# Patient Record
Sex: Female | Born: 1960 | Race: White | Hispanic: No | Smoking: Former smoker
Health system: Southern US, Community
[De-identification: ages and names within clinical notes are randomized; demographics above are authoritative.]

## PROBLEM LIST (undated history)

## (undated) DIAGNOSIS — K5790 Diverticulosis of intestine, part unspecified, without perforation or abscess without bleeding: Secondary | ICD-10-CM

## (undated) DIAGNOSIS — D649 Anemia, unspecified: Secondary | ICD-10-CM

## (undated) DIAGNOSIS — Z87440 Personal history of urinary (tract) infections: Secondary | ICD-10-CM

## (undated) DIAGNOSIS — R011 Cardiac murmur, unspecified: Secondary | ICD-10-CM

## (undated) DIAGNOSIS — D126 Benign neoplasm of colon, unspecified: Secondary | ICD-10-CM

## (undated) DIAGNOSIS — E785 Hyperlipidemia, unspecified: Secondary | ICD-10-CM

## (undated) DIAGNOSIS — T7840XA Allergy, unspecified, initial encounter: Secondary | ICD-10-CM

## (undated) DIAGNOSIS — K635 Polyp of colon: Secondary | ICD-10-CM

## (undated) DIAGNOSIS — M1990 Unspecified osteoarthritis, unspecified site: Secondary | ICD-10-CM

## (undated) DIAGNOSIS — I10 Essential (primary) hypertension: Secondary | ICD-10-CM

## (undated) DIAGNOSIS — N200 Calculus of kidney: Secondary | ICD-10-CM

## (undated) DIAGNOSIS — E039 Hypothyroidism, unspecified: Secondary | ICD-10-CM

## (undated) HISTORY — PX: HEMORRHOID SURGERY: SHX153

## (undated) HISTORY — DX: Polyp of colon: K63.5

## (undated) HISTORY — DX: Essential (primary) hypertension: I10

## (undated) HISTORY — DX: Personal history of urinary (tract) infections: Z87.440

## (undated) HISTORY — DX: Hyperlipidemia, unspecified: E78.5

## (undated) HISTORY — PX: BREAST CYST ASPIRATION: SHX578

## (undated) HISTORY — DX: Hypothyroidism, unspecified: E03.9

## (undated) HISTORY — PX: APPENDECTOMY: SHX54

## (undated) HISTORY — DX: Cardiac murmur, unspecified: R01.1

## (undated) HISTORY — PX: HERNIA REPAIR: SHX51

## (undated) HISTORY — DX: Unspecified osteoarthritis, unspecified site: M19.90

## (undated) HISTORY — DX: Diverticulosis of intestine, part unspecified, without perforation or abscess without bleeding: K57.90

## (undated) HISTORY — DX: Allergy, unspecified, initial encounter: T78.40XA

## (undated) HISTORY — PX: TONSILLECTOMY: SHX5217

## (undated) HISTORY — DX: Anemia, unspecified: D64.9

## (undated) HISTORY — DX: Benign neoplasm of colon, unspecified: D12.6

## (undated) HISTORY — DX: Calculus of kidney: N20.0

---

## 1998-09-14 ENCOUNTER — Ambulatory Visit (HOSPITAL_COMMUNITY): Admission: RE | Admit: 1998-09-14 | Discharge: 1998-09-14 | Payer: Self-pay | Admitting: *Deleted

## 1998-10-31 ENCOUNTER — Encounter: Payer: Self-pay | Admitting: Gastroenterology

## 1998-10-31 ENCOUNTER — Ambulatory Visit (HOSPITAL_COMMUNITY): Admission: RE | Admit: 1998-10-31 | Discharge: 1998-10-31 | Payer: Self-pay | Admitting: Gastroenterology

## 1999-01-16 ENCOUNTER — Other Ambulatory Visit: Admission: RE | Admit: 1999-01-16 | Discharge: 1999-01-16 | Payer: Self-pay | Admitting: Obstetrics and Gynecology

## 1999-07-13 ENCOUNTER — Encounter: Payer: Self-pay | Admitting: Obstetrics and Gynecology

## 1999-07-13 ENCOUNTER — Encounter: Admission: RE | Admit: 1999-07-13 | Discharge: 1999-07-13 | Payer: Self-pay | Admitting: Obstetrics and Gynecology

## 1999-08-29 ENCOUNTER — Encounter: Admission: RE | Admit: 1999-08-29 | Discharge: 1999-08-29 | Payer: Self-pay | Admitting: Family Medicine

## 1999-08-29 ENCOUNTER — Encounter: Payer: Self-pay | Admitting: Family Medicine

## 1999-11-29 ENCOUNTER — Encounter: Payer: Self-pay | Admitting: *Deleted

## 1999-11-29 ENCOUNTER — Encounter: Admission: RE | Admit: 1999-11-29 | Discharge: 1999-11-29 | Payer: Self-pay | Admitting: *Deleted

## 2000-01-15 ENCOUNTER — Other Ambulatory Visit: Admission: RE | Admit: 2000-01-15 | Discharge: 2000-01-15 | Payer: Self-pay | Admitting: Obstetrics and Gynecology

## 2001-01-27 ENCOUNTER — Other Ambulatory Visit: Admission: RE | Admit: 2001-01-27 | Discharge: 2001-01-27 | Payer: Self-pay | Admitting: Obstetrics and Gynecology

## 2001-06-09 ENCOUNTER — Encounter (INDEPENDENT_AMBULATORY_CARE_PROVIDER_SITE_OTHER): Payer: Self-pay

## 2001-06-09 ENCOUNTER — Ambulatory Visit (HOSPITAL_COMMUNITY): Admission: RE | Admit: 2001-06-09 | Discharge: 2001-06-09 | Payer: Self-pay | Admitting: General Surgery

## 2001-08-19 ENCOUNTER — Encounter: Admission: RE | Admit: 2001-08-19 | Discharge: 2001-08-19 | Payer: Self-pay | Admitting: Obstetrics and Gynecology

## 2001-08-19 ENCOUNTER — Encounter: Payer: Self-pay | Admitting: Obstetrics and Gynecology

## 2002-01-11 ENCOUNTER — Other Ambulatory Visit: Admission: RE | Admit: 2002-01-11 | Discharge: 2002-01-11 | Payer: Self-pay | Admitting: Urology

## 2002-02-02 ENCOUNTER — Other Ambulatory Visit: Admission: RE | Admit: 2002-02-02 | Discharge: 2002-02-02 | Payer: Self-pay | Admitting: Obstetrics and Gynecology

## 2002-09-23 ENCOUNTER — Encounter: Admission: RE | Admit: 2002-09-23 | Discharge: 2002-09-23 | Payer: Self-pay | Admitting: Obstetrics and Gynecology

## 2002-09-23 ENCOUNTER — Encounter: Payer: Self-pay | Admitting: Obstetrics and Gynecology

## 2003-03-07 ENCOUNTER — Other Ambulatory Visit: Admission: RE | Admit: 2003-03-07 | Discharge: 2003-03-07 | Payer: Self-pay | Admitting: Obstetrics and Gynecology

## 2003-10-28 ENCOUNTER — Encounter: Admission: RE | Admit: 2003-10-28 | Discharge: 2003-10-28 | Payer: Self-pay | Admitting: Obstetrics and Gynecology

## 2004-03-28 ENCOUNTER — Encounter: Admission: RE | Admit: 2004-03-28 | Discharge: 2004-03-28 | Payer: Self-pay | Admitting: Family Medicine

## 2004-11-12 ENCOUNTER — Encounter: Admission: RE | Admit: 2004-11-12 | Discharge: 2004-11-12 | Payer: Self-pay | Admitting: Obstetrics and Gynecology

## 2005-11-13 ENCOUNTER — Encounter: Admission: RE | Admit: 2005-11-13 | Discharge: 2005-11-13 | Payer: Self-pay | Admitting: Obstetrics and Gynecology

## 2006-12-02 ENCOUNTER — Encounter: Admission: RE | Admit: 2006-12-02 | Discharge: 2006-12-02 | Payer: Self-pay | Admitting: Obstetrics and Gynecology

## 2008-01-21 ENCOUNTER — Encounter: Admission: RE | Admit: 2008-01-21 | Discharge: 2008-01-21 | Payer: Self-pay | Admitting: Obstetrics and Gynecology

## 2008-08-25 ENCOUNTER — Encounter: Admission: RE | Admit: 2008-08-25 | Discharge: 2008-08-25 | Payer: Self-pay | Admitting: Obstetrics and Gynecology

## 2009-01-24 ENCOUNTER — Encounter: Admission: RE | Admit: 2009-01-24 | Discharge: 2009-01-24 | Payer: Self-pay | Admitting: Obstetrics and Gynecology

## 2009-03-08 ENCOUNTER — Ambulatory Visit: Payer: Self-pay | Admitting: Family Medicine

## 2009-03-08 DIAGNOSIS — M1990 Unspecified osteoarthritis, unspecified site: Secondary | ICD-10-CM | POA: Insufficient documentation

## 2009-03-08 DIAGNOSIS — I10 Essential (primary) hypertension: Secondary | ICD-10-CM | POA: Insufficient documentation

## 2009-03-08 DIAGNOSIS — Z87442 Personal history of urinary calculi: Secondary | ICD-10-CM | POA: Insufficient documentation

## 2009-03-08 DIAGNOSIS — Z87448 Personal history of other diseases of urinary system: Secondary | ICD-10-CM | POA: Insufficient documentation

## 2009-03-08 DIAGNOSIS — D509 Iron deficiency anemia, unspecified: Secondary | ICD-10-CM | POA: Insufficient documentation

## 2009-03-08 DIAGNOSIS — Z860100 Personal history of colon polyps, unspecified: Secondary | ICD-10-CM | POA: Insufficient documentation

## 2009-03-08 DIAGNOSIS — M7710 Lateral epicondylitis, unspecified elbow: Secondary | ICD-10-CM | POA: Insufficient documentation

## 2009-03-08 DIAGNOSIS — Z8679 Personal history of other diseases of the circulatory system: Secondary | ICD-10-CM | POA: Insufficient documentation

## 2009-03-08 DIAGNOSIS — J309 Allergic rhinitis, unspecified: Secondary | ICD-10-CM | POA: Insufficient documentation

## 2009-09-27 ENCOUNTER — Encounter: Admission: RE | Admit: 2009-09-27 | Discharge: 2009-09-27 | Payer: Self-pay | Admitting: Obstetrics and Gynecology

## 2009-10-10 ENCOUNTER — Encounter: Admission: RE | Admit: 2009-10-10 | Discharge: 2009-10-10 | Payer: Self-pay | Admitting: Obstetrics and Gynecology

## 2009-12-06 ENCOUNTER — Ambulatory Visit: Payer: Self-pay | Admitting: Family Medicine

## 2009-12-06 LAB — CONVERTED CEMR LAB
Blood in Urine, dipstick: NEGATIVE
Ketones, urine, test strip: NEGATIVE
Nitrite: NEGATIVE
Urobilinogen, UA: 0.2
pH: 5.5

## 2009-12-11 LAB — CONVERTED CEMR LAB
AST: 17 units/L (ref 0–37)
Alkaline Phosphatase: 43 units/L (ref 39–117)
BUN: 13 mg/dL (ref 6–23)
Basophils Relative: 0.6 % (ref 0.0–3.0)
Bilirubin, Direct: 0 mg/dL (ref 0.0–0.3)
Calcium: 9.2 mg/dL (ref 8.4–10.5)
Creatinine, Ser: 0.7 mg/dL (ref 0.4–1.2)
Eosinophils Absolute: 0.2 10*3/uL (ref 0.0–0.7)
Glucose, Bld: 86 mg/dL (ref 70–99)
HCT: 38.9 % (ref 36.0–46.0)
HDL: 63.1 mg/dL (ref >39.00)
Lymphocytes Relative: 27.9 % (ref 12.0–46.0)
MCV: 91.3 fL (ref 78.0–100.0)
Monocytes Absolute: 0.6 10*3/uL (ref 0.1–1.0)
Neutrophils Relative %: 62.2 % (ref 43.0–77.0)
Platelets: 229 10*3/uL (ref 150.0–400.0)

## 2009-12-13 ENCOUNTER — Ambulatory Visit: Payer: Self-pay | Admitting: Family Medicine

## 2010-01-26 ENCOUNTER — Encounter: Admission: RE | Admit: 2010-01-26 | Discharge: 2010-01-26 | Payer: Self-pay | Admitting: Obstetrics and Gynecology

## 2010-03-23 ENCOUNTER — Telehealth: Payer: Self-pay | Admitting: Family Medicine

## 2010-06-27 ENCOUNTER — Ambulatory Visit: Payer: Self-pay | Admitting: Family Medicine

## 2010-06-27 DIAGNOSIS — B351 Tinea unguium: Secondary | ICD-10-CM | POA: Insufficient documentation

## 2010-06-27 DIAGNOSIS — M461 Sacroiliitis, not elsewhere classified: Secondary | ICD-10-CM | POA: Insufficient documentation

## 2010-09-02 ENCOUNTER — Encounter: Payer: Self-pay | Admitting: Obstetrics and Gynecology

## 2010-09-11 ENCOUNTER — Other Ambulatory Visit: Payer: Self-pay | Admitting: Dermatology

## 2010-09-11 NOTE — Assessment & Plan Note (Signed)
Summary: back pain//ccm   Vital Signs:  Patient profile:   50 year old female Weight:      131 pounds Temp:     98.2 degrees F Pulse rate:   75 / minute BP sitting:   110 / 78  (left arm)  Vitals Entered By: Pura Spice, RN (June 27, 2010 2:14 PM) CC: back and hip pain "little better today "   History of Present Illness: here for 2 reasons. First she has had stiffness and pains in the lower back on and off for one month. it is worst when she first gets up in the morning and then eases up. Advil helps. No pain in the legs. No hx of trauma. Second, for one year she has had yellow discoloration and thickening of the toenails.   Allergies (verified): No Known Drug Allergies  Past History:  Past Medical History: Reviewed history from 03/08/2009 and no changes required. Chickenpox Allergic rhinitis Anemia-iron deficiency Colonic polyps, hx of Hypertension Nephrolithiasis, hx of Osteoarthritis Recurrent UTI Heart murmur sees Dr. Floyde Parkins for GYN exams  Past Surgical History: Reviewed history from 03/08/2009 and no changes required. Hemorrhoidectomy Tonsillectomy colonoscopy 1995  Review of Systems  The patient denies anorexia, fever, weight loss, weight gain, vision loss, decreased hearing, hoarseness, chest pain, syncope, dyspnea on exertion, peripheral edema, prolonged cough, headaches, hemoptysis, abdominal pain, melena, hematochezia, severe indigestion/heartburn, hematuria, incontinence, genital sores, muscle weakness, suspicious skin lesions, transient blindness, difficulty walking, depression, unusual weight change, abnormal bleeding, enlarged lymph nodes, angioedema, breast masses, and testicular masses.    Physical Exam  General:  Well-developed,well-nourished,in no acute distress; alert,appropriate and cooperative throughout examination Msk:  mildly tender in the lower back, especially over the SI joints. Full ROM Skin:  toenails are yellow, crusty,  and thick   Impression & Recommendations:  Problem # 1:  SACROILIITIS (ICD-720.2)  Problem # 2:  ONYCHOMYCOSIS (ICD-110.1)  Her updated medication list for this problem includes:    Lamisil 250 Mg Tabs (Terbinafine hcl) ..... Once daily  Complete Medication List: 1)  Hydrochlorothiazide 12.5 Mg Tabs (Hydrochlorothiazide) .... Take 1 tab  by mouth every morning 2)  Diclofenac Sodium 50 Mg Tbec (Diclofenac sodium) .... Three times a day as needed pain 3)  Lamisil 250 Mg Tabs (Terbinafine hcl) .... Once daily  Patient Instructions: 1)  Get regular exercise.  2)  Please schedule a follow-up appointment as needed .  Prescriptions: LAMISIL 250 MG TABS (TERBINAFINE HCL) once daily  #30 x 2   Entered and Authorized by:   Nelwyn Salisbury MD   Signed by:   Nelwyn Salisbury MD on 06/27/2010   Method used:   Electronically to        [facility]. [phone]* (retail)       [phone]       [place], [place]  [address]       Ph: [phone]       Fax: [phone]   RxID:   [id number] DICLOFENAC SODIUM 50 MG TBEC (DICLOFENAC SODIUM) three times a day as needed pain  #60 x 5   Entered and Authorized by:   Nelwyn Salisbury MD   Signed by:   Nelwyn Salisbury MD on 06/27/2010   Method used:   Electronically to        [facility]. [phone]* (retail)       [phone] [address]  [place], [place]  [address]       Ph: [phone]       Fax: [phone]   RxID:   [id number]    Orders Added: 1)  Est. Patient Level IV [84132]

## 2010-09-11 NOTE — Assessment & Plan Note (Signed)
Summary: cpx/no pap/njr   Vital Signs:  Patient profile:   50 year old female Weight:      126 pounds BMI:     23.70 BP sitting:   108 / 84  (left arm) Cuff size:   regular  Vitals Entered By: Raechel Ache, RN (Dec 13, 2009 1:33 PM) CC: CPX, labs done. Sees gyn.   History of Present Illness: 50 yr old female for a cpx. She feels fine in general, although she does mention having excessive tearing out of the right eye for severla months. The NP at work gave her some [employer] drops to try , but these have not helped. No buring or itching or redness. the other eye is fine. She does not wear contacts.   Allergies (verified): No Known Drug Allergies  Past History:  Past Medical History: Reviewed history from 03/08/2009 and no changes required. Chickenpox Allergic rhinitis Anemia-iron deficiency Colonic polyps, hx of Hypertension Nephrolithiasis, hx of Osteoarthritis Recurrent UTI Heart murmur sees Dr. Floyde Parkins for GYN exams  Past Surgical History: Reviewed history from 03/08/2009 and no changes required. Hemorrhoidectomy Tonsillectomy colonoscopy 1995  Family History: Reviewed history from 03/08/2009 and no changes required. Family History Breast cancer 1st degree relative <50 Family History of CAD Female 1st degree relative <50 Family History of Colon CA 1st degree relative <60 Family History Hypertension Family History Ovarian cancer Family History of Stroke M 1st degree relative <50  Social History: Reviewed history from 03/08/2009 and no changes required. Divorced Alcohol use-yes Drug use-no Regular exercise-yes Former smoker  Review of Systems  The patient denies anorexia, fever, weight loss, weight gain, vision loss, decreased hearing, hoarseness, chest pain, syncope, dyspnea on exertion, peripheral edema, prolonged cough, headaches, hemoptysis, abdominal pain, melena, hematochezia, severe indigestion/heartburn, hematuria, incontinence, genital  sores, muscle weakness, suspicious skin lesions, transient blindness, difficulty walking, depression, unusual weight change, abnormal bleeding, enlarged lymph nodes, angioedema, breast masses, and testicular masses.    Physical Exam  General:  Well-developed,well-nourished,in no acute distress; alert,appropriate and cooperative throughout examination Head:  Normocephalic and atraumatic without obvious abnormalities. No apparent alopecia or balding. Eyes:  No corneal or conjunctival inflammation noted. EOMI. Perrla. Funduscopic exam benign, without hemorrhages, exudates or papilledema. Vision grossly normal. Ears:  External ear exam shows no significant lesions or deformities.  Otoscopic examination reveals clear canals, tympanic membranes are intact bilaterally without bulging, retraction, inflammation or discharge. Hearing is grossly normal bilaterally. Nose:  External nasal examination shows no deformity or inflammation. Nasal mucosa are pink and moist without lesions or exudates. Mouth:  Oral mucosa and oropharynx without lesions or exudates.  Teeth in good repair. Neck:  No deformities, masses, or tenderness noted. Chest Wall:  No deformities, masses, or tenderness noted. Lungs:  Normal respiratory effort, chest expands symmetrically. Lungs are clear to auscultation, no crackles or wheezes. Heart:  Normal rate and regular rhythm. S1 and S2 normal without gallop, murmur, click, rub or other extra sounds. Abdomen:  Bowel sounds positive,abdomen soft and non-tender without masses, organomegaly or hernias noted. Msk:  No deformity or scoliosis noted of thoracic or lumbar spine.   Pulses:  R and L carotid,radial,femoral,dorsalis pedis and posterior tibial pulses are full and equal bilaterally Extremities:  No clubbing, cyanosis, edema, or deformity noted with normal full range of motion of all joints.   Neurologic:  No cranial nerve deficits noted. Station and gait are normal. Plantar reflexes are  down-going bilaterally. DTRs are symmetrical throughout. Sensory, motor and coordinative functions appear intact.  Skin:  Intact without suspicious lesions or rashes Cervical Nodes:  No lymphadenopathy noted Axillary Nodes:  No palpable lymphadenopathy Inguinal Nodes:  No significant adenopathy Psych:  Cognition and judgment appear intact. Alert and cooperative with normal attention span and concentration. No apparent delusions, illusions, hallucinations   Impression & Recommendations:  Problem # 1:  HEALTH MAINTENANCE EXAM (ICD-V70.0)  Complete Medication List: 1)  Hydrochlorothiazide 12.5 Mg Tabs (Hydrochlorothiazide) .... Take 1 tab  by mouth every morning  Patient Instructions: 1)  she seems to have a blocked tear duct in the right eye. She is to see her optometrist soon for a regular exam and she will ask them to look at this. She may need referral to an Ophthalmologist but we will see.    Preventive Care Screening  Mammogram:    Date:  09/12/2009    Results:  normal

## 2010-09-11 NOTE — Progress Notes (Signed)
Summary: Pt called re: the # of refills on HCTZ. Suppose to be 1 yr.  Phone Note Call from Patient Call back at Home Phone [phone]   Caller: Patient Summary of Call: Pt called re: the script for HCTZ. Pt says she normally get script that is good for 1 years worth of refills, but when she pick this up at [facility] [place], script on has 4 refills remaining for the year. Pls call in rest of refills.  Initial call taken by: Lucy Antigua,  March 23, 2010 2:11 PM    Prescriptions: HYDROCHLOROTHIAZIDE 12.5 MG  TABS (HYDROCHLOROTHIAZIDE) Take 1 tab  by mouth every morning  #30 Capsule x 11   Entered by:   Raechel Ache, RN   Authorized by:   Nelwyn Salisbury MD   Signed by:   Raechel Ache, RN on 03/23/2010   Method used:   Electronically to        [facility]. [phone]* (retail)       [phone]       [place], [place]  [address]       Ph: [phone]       Fax: [phone]   RxID:   [id number]

## 2010-12-19 ENCOUNTER — Other Ambulatory Visit (INDEPENDENT_AMBULATORY_CARE_PROVIDER_SITE_OTHER): Payer: BC Managed Care – PPO

## 2010-12-19 ENCOUNTER — Other Ambulatory Visit: Payer: Self-pay | Admitting: Family Medicine

## 2010-12-19 DIAGNOSIS — Z0000 Encounter for general adult medical examination without abnormal findings: Secondary | ICD-10-CM

## 2010-12-19 LAB — BASIC METABOLIC PANEL
Chloride: 107 mEq/L (ref 96–112)
GFR: 97.58 mL/min (ref >60.00)

## 2010-12-19 LAB — URINALYSIS
Bilirubin Urine: NEGATIVE
Hgb urine dipstick: NEGATIVE
Urine Glucose: NEGATIVE

## 2010-12-19 LAB — CBC WITH DIFFERENTIAL/PLATELET
HCT: 38.8 % (ref 36.0–46.0)
Lymphocytes Relative: 38.6 % (ref 12.0–46.0)
Monocytes Relative: 6.7 % (ref 3.0–12.0)
Neutro Abs: 3.8 10*3/uL (ref 1.4–7.7)
Platelets: 214 10*3/uL (ref 150.0–400.0)
RBC: 4.29 Mil/uL (ref 3.87–5.11)

## 2010-12-19 LAB — LIPID PANEL
LDL Cholesterol: 116 mg/dL — ABNORMAL HIGH (ref 0–99)
Total CHOL/HDL Ratio: 4
VLDL: 20.8 mg/dL (ref 0.0–40.0)

## 2010-12-19 LAB — HEPATIC FUNCTION PANEL
ALT: 20 U/L (ref 0–35)
Albumin: 4.2 g/dL (ref 3.5–5.2)
Alkaline Phosphatase: 50 U/L (ref 39–117)
Bilirubin, Direct: 0 mg/dL (ref 0.0–0.3)
Total Bilirubin: 0.5 mg/dL (ref 0.3–1.2)

## 2010-12-20 NOTE — Progress Notes (Signed)
Pt. Informed.

## 2010-12-26 ENCOUNTER — Encounter: Payer: Self-pay | Admitting: Family Medicine

## 2010-12-26 ENCOUNTER — Ambulatory Visit (INDEPENDENT_AMBULATORY_CARE_PROVIDER_SITE_OTHER): Payer: BC Managed Care – PPO | Admitting: Family Medicine

## 2010-12-26 VITALS — BP 120/84 | Temp 98.5°F | Ht 61.0 in | Wt 134.0 lb

## 2010-12-26 DIAGNOSIS — K635 Polyp of colon: Secondary | ICD-10-CM

## 2010-12-26 DIAGNOSIS — D126 Benign neoplasm of colon, unspecified: Secondary | ICD-10-CM

## 2010-12-26 DIAGNOSIS — Z0000 Encounter for general adult medical examination without abnormal findings: Secondary | ICD-10-CM

## 2010-12-26 MED ORDER — CEPHALEXIN 250 MG/5ML PO SUSR: 250.0000 mg | Freq: Three times a day (TID) | ORAL | Status: DC

## 2010-12-26 MED ORDER — HYDROCHLOROTHIAZIDE 12.5 MG PO CAPS: 12.5000 mg | ORAL_CAPSULE | ORAL | Status: DC

## 2010-12-26 NOTE — Progress Notes (Signed)
  Subjective:    Patient ID: Tracy Brown, female    DOB: [birth year], 50 y.o.   MRN: [id number]  HPI 50 yr old female for a cpx. She feels fine with no concerns. Her last GYN exam was in June of last year with Dr. Floyde Parkins. Since Dr. Rosalio Macadamia has retired, she will have [name] do her GYN exams from now on.    Review of Systems  Constitutional: Negative.   HENT: Negative.   Eyes: Negative.   Respiratory: Negative.   Cardiovascular: Negative.   Gastrointestinal: Negative.   Genitourinary: Negative for dysuria, urgency, frequency, hematuria, flank pain, decreased urine volume, enuresis, difficulty urinating, pelvic pain and dyspareunia.  Musculoskeletal: Negative.   Skin: Negative.   Neurological: Negative.   Hematological: Negative.   Psychiatric/Behavioral: Negative.        Objective:   Physical Exam  Constitutional: She is oriented to person, place, and time. She appears well-developed and well-nourished. No distress.  HENT:  Head: Normocephalic and atraumatic.  Right Ear: External ear normal.  Left Ear: External ear normal.  Nose: Nose normal.  Mouth/Throat: Oropharynx is clear and moist. No oropharyngeal exudate.  Eyes: Conjunctivae and EOM are normal. Pupils are equal, round, and reactive to light. No scleral icterus.  Neck: Normal range of motion. Neck supple. No JVD present. No thyromegaly present.  Cardiovascular: Normal rate, regular rhythm, normal heart sounds and intact distal pulses.  Exam reveals no gallop and no friction rub.   No murmur heard. Pulmonary/Chest: Effort normal and breath sounds normal. No respiratory distress. She has no wheezes. She has no rales. She exhibits no tenderness.  Abdominal: Soft. Bowel sounds are normal. She exhibits no distension and no mass. There is no tenderness. There is no rebound and no guarding.  Musculoskeletal: Normal range of motion. She exhibits no edema and no tenderness.  Lymphadenopathy:    She has no cervical  adenopathy.  Neurological: She is alert and oriented to person, place, and time. She has normal reflexes. No cranial nerve deficit. She exhibits normal muscle tone. Coordination normal.  Skin: Skin is warm and dry. No rash noted. No erythema.  Psychiatric: She has a normal mood and affect. Her behavior is normal. Judgment and thought content normal.          Assessment & Plan:  Doing well. She is quite past due for another colonoscopy so we will set her up for this with Dr.Mann.

## 2010-12-27 NOTE — Progress Notes (Signed)
Addended by: Dwaine Deter on: 12/27/2010 08:30 AM   Modules accepted: Orders

## 2010-12-28 NOTE — Op Note (Signed)
[facility]  Patient:    Tracy Brown, Tracy Brown Visit Number: [id number] MRN: [id number]          Service Type: DSU Location: DAY Attending Physician:  Carson Myrtle Dictated by:   Sheppard Plumber Earlene Plater, M.D. Proc. Date: 06/09/01 Admit Date:  06/09/2001                             Operative Report  PREOPERATIVE DIAGNOSIS:   External hemorrhoids.  POSTOPERATIVE DIAGNOSIS:  External hemorrhoids.  OPERATION:  External hemorrhoidectomy.  SURGEON:  Timothy E. Earlene Plater, M.D.  ANESTHESIA:  Local standby.  INDICATIONS:  The patient is an otherwise healthy, age 50, with long history of external hemorrhoids, and after careful discussion in the office about the procedure, the expectations she wishes to proceed with an external hemorrhoidectomy.  DESCRIPTION OF PROCEDURE:  The patient was prepared at home, see in the preoperative area and taken to the operating room, placed supine, IV started and sedation given.  She was placed in the lithotomy position, perianal area inspected, prepped and draped in the usual fashion.  Proctoscopy was accomplished to 20 cm and was normal.  The scope was withdrawn and the area reprepped and draped.  Hemorrhoids were present prominently in the right anterior and right posterior positions.  Marcaine 0.25% with epinephrine mixed 1:1 with Wydase was injected around and about the anal orifice and massaged in well.  The right anterior and right posterior hemorrhoids were carefully excised, removing the excess skin and removing the underlying varicosities. This was accomplished across the anoderm to remove the hemorrhoidal masses. When this was satisfactory done, each wound was carefully closed with a running 3-0 chromic suture. The results were satisfactory.  The bleeding was controlled, and the wounds were closed.  Sphincters remained intact.  No complications.  Gelfoam gauze with dry sterile dressing applied.  She tolerated it well  and was removed to the recovery room in good condition.  DISCHARGE INSTRUCTIONS: Written and verbal instructions including Percocet #40 were given and she will be seen and followed as an outpatient. Dictated by:   Sheppard Plumber Earlene Plater, M.D. Attending Physician:  Carson Myrtle DD:  06/09/01 TD:  06/09/01 Job: [id number]

## 2011-02-04 ENCOUNTER — Other Ambulatory Visit: Payer: Self-pay | Admitting: Obstetrics & Gynecology

## 2011-02-04 DIAGNOSIS — Z1231 Encounter for screening mammogram for malignant neoplasm of breast: Secondary | ICD-10-CM

## 2011-02-11 ENCOUNTER — Ambulatory Visit (INDEPENDENT_AMBULATORY_CARE_PROVIDER_SITE_OTHER): Payer: BC Managed Care – PPO | Admitting: Family Medicine

## 2011-02-11 ENCOUNTER — Encounter: Payer: Self-pay | Admitting: Family Medicine

## 2011-02-11 VITALS — BP 118/80 | Temp 98.2°F | Wt 135.0 lb

## 2011-02-11 DIAGNOSIS — R42 Dizziness and giddiness: Secondary | ICD-10-CM

## 2011-02-11 NOTE — Progress Notes (Signed)
  Subjective:    Patient ID: Tracy Brown, female    DOB: [birth year], 50 y.o.   MRN: [id number]  HPI Here for one month of intermittent dizziness, which is similar to the vertigo she has had in the past. No HA or sinus pressure or PND. No ST or cough or fever. No nausea. When she moves her head quickly, she feels as if the room is spinning. She used Meclizine in the past, but it makes her too sedated.    Review of Systems  Constitutional: Negative.   HENT: Negative.   Respiratory: Negative.   Cardiovascular: Negative.   Neurological: Positive for dizziness.       Objective:   Physical Exam  Constitutional: She is oriented to person, place, and time. She appears well-developed and well-nourished.  HENT:  Head: Normocephalic and atraumatic.  Right Ear: External ear normal.  Left Ear: External ear normal.  Nose: Nose normal.  Mouth/Throat: Oropharynx is clear and moist. No oropharyngeal exudate.  Eyes: Conjunctivae are normal.  Neck: Normal range of motion. Neck supple. No thyromegaly present.  Lymphadenopathy:    She has no cervical adenopathy.  Neurological: She is alert and oriented to person, place, and time. No cranial nerve deficit. Coordination normal.          Assessment & Plan:  Try a non-sedating antihistamine like Claritin or Zyrtec daily. If this does not help in the next few weeks, we could try Vestibular rehab.

## 2011-02-12 ENCOUNTER — Ambulatory Visit: Payer: BC Managed Care – PPO

## 2011-02-15 ENCOUNTER — Other Ambulatory Visit: Payer: Self-pay | Admitting: Obstetrics & Gynecology

## 2011-02-18 ENCOUNTER — Ambulatory Visit
Admission: RE | Admit: 2011-02-18 | Discharge: 2011-02-18 | Disposition: A | Discharge status: Home / Self Care | Payer: BC Managed Care – PPO | Source: Ambulatory Visit | Attending: Obstetrics & Gynecology | Admitting: Obstetrics & Gynecology

## 2011-02-18 DIAGNOSIS — Z1231 Encounter for screening mammogram for malignant neoplasm of breast: Secondary | ICD-10-CM

## 2011-02-20 ENCOUNTER — Ambulatory Visit
Admission: RE | Admit: 2011-02-20 | Discharge: 2011-02-20 | Disposition: A | Discharge status: Home / Self Care | Payer: BC Managed Care – PPO | Source: Ambulatory Visit | Attending: Gastroenterology | Admitting: Gastroenterology

## 2011-02-20 ENCOUNTER — Other Ambulatory Visit: Payer: Self-pay | Admitting: Gastroenterology

## 2011-02-20 DIAGNOSIS — K6389 Other specified diseases of intestine: Secondary | ICD-10-CM

## 2011-02-20 MED ORDER — IOHEXOL 300 MG/ML  SOLN
100.0000 mL | Freq: Once | INTRAMUSCULAR | Status: AC | PRN
  Administered 2011-02-20: 100 mL via INTRAVENOUS

## 2011-02-27 ENCOUNTER — Encounter (INDEPENDENT_AMBULATORY_CARE_PROVIDER_SITE_OTHER): Payer: Self-pay

## 2011-02-27 ENCOUNTER — Ambulatory Visit (INDEPENDENT_AMBULATORY_CARE_PROVIDER_SITE_OTHER): Payer: BC Managed Care – PPO | Admitting: General Surgery

## 2011-02-27 ENCOUNTER — Encounter (INDEPENDENT_AMBULATORY_CARE_PROVIDER_SITE_OTHER): Payer: Self-pay | Admitting: General Surgery

## 2011-02-27 DIAGNOSIS — K6389 Other specified diseases of intestine: Secondary | ICD-10-CM | POA: Insufficient documentation

## 2011-02-27 NOTE — Progress Notes (Signed)
Tracy Brown is a 50 y.o. female.    No chief complaint on file.   HPI HPI The patient is a 50 year old female with a routine colonoscopy to have a large right colon mass. She had had a previous colonoscopy in 2000 by the same gastroenterologist for which time only a smaller polyp was resected without any evidence of tumor. Prior to this colonoscopic examination the patient had had no evidence of bleeding in her stool. She had had some intermittent constipation which she treated with Metamucil only.  A biopsy from the colon mass in the right colon near the cecum demonstrates a tubular adenoma. There is currently no evidence of malignancy. She comes in now for evaluation.  Past Medical History  Diagnosis Date  . Chickenpox   . Allergy   . Anemia   . Colon polyps   . Hypertension   . Nephrolithiasis   . OA (osteoarthritis)   . History of recurrent UTIs   . Heart murmur     Past Surgical History  Procedure Date  . Hemorrhoid surgery   . Tonsillectomy   . Colonoscopy 1995  . Hernia repair     Family History  Problem Relation Age of Onset  . Cancer Other     breast,colon,ovarian  . Coronary artery disease Other   . Hypertension Other   . Stroke Other   . Leukemia Father     Social History History  Substance Use Topics  . Smoking status: Current Some Day Smoker  . Smokeless tobacco: Not on file  . Alcohol Use: No    Allergies  Allergen Reactions  . Hydrocodone   . Zithromax (Azithromycin Dihydrate)     Current Outpatient Prescriptions  Medication Sig Dispense Refill  . calcium carbonate (OS-CAL) 600 MG TABS Take 600 mg by mouth 2 (two) times daily with a meal.        . Cetirizine HCl (ZYRTEC PO) Take by mouth.        . hydrochlorothiazide (,MICROZIDE/HYDRODIURIL,) 12.5 MG capsule Take 1 capsule (12.5 mg total) by mouth every morning.  30 capsule  11  . Nicotine (NICODERM CQ TD) Place onto the skin.        . fish oil-omega-3 fatty acids 1000 MG capsule Take 2 g  by mouth daily.        . Multiple Vitamins-Minerals (MULTIVITAMIN,TX-MINERALS) tablet Take 1 tablet by mouth daily.          Review of Systems Review of Systems  Constitutional: Negative for fever, chills, weight loss, malaise/fatigue and diaphoresis.  HENT: Negative.   Eyes: Negative.   Respiratory: Negative.   Cardiovascular: Negative.   Gastrointestinal: Positive for constipation (improved with Metamucil). Negative for blood in stool and melena.  Genitourinary: Negative.   Musculoskeletal: Negative.   Skin: Negative.   Neurological: Negative.  Negative for weakness.  Endo/Heme/Allergies: Negative.   Psychiatric/Behavioral: Negative.     Physical Exam Physical Exam  Constitutional: She is oriented to person, place, and time. She appears well-developed and well-nourished.  HENT:  Head: Normocephalic and atraumatic.  Eyes: Conjunctivae are normal. Pupils are equal, round, and reactive to light.  Neck: Normal range of motion. Neck supple. No JVD present.  Cardiovascular: Normal rate, regular rhythm and normal heart sounds.   Respiratory: Effort normal and breath sounds normal. No respiratory distress.  GI: Soft. Bowel sounds are normal. She exhibits no distension and no mass. There is no tenderness. There is no rebound and no guarding.  Musculoskeletal: Normal range of  motion.  Lymphadenopathy:    She has no cervical adenopathy.  Neurological: She is alert and oriented to person, place, and time. She has normal reflexes.  Skin: Skin is warm and dry.  Psychiatric: She has a normal mood and affect. Her behavior is normal. Judgment and thought content normal.     Blood pressure 116/72, pulse 60, temperature 97.5 F (36.4 C), temperature source Temporal, height 5\' 1"  (1.549 m), weight 133 lb 9.6 oz (60.601 kg).  Assessment/Plan Have reviewed the study sent over by Dr. Kenna Gilbert office. Recent biopsy results demonstrated this to be a tubular adenoma without evidence of high-grade  dysplasia. CT scan of the abdomen and pelvis demonstrated polypoid soft tissue mass in the right colon measuring about 11 x 20 mm in size. There is no evidence of metastatic involvement of the liver  Plan: I discussed with the patient and her parents the necessity for a right colectomy. Because the pathology does not demonstrate any evidence of high-grade dysplasia or tendencies towards colon cancer I do not think that a formal right hemicolectomy as needed. Limited right colectomy can be performed a right transverse incision. I can also access the liver and palpated and explore of the organs through this incision.  We will get the patient prepared using a two-day bowel prep along with oral antibiotics . The risks and benefits have been explained to the patient including the option of laparoscopic surgery. The understand and wish proceed. Currently she is scheduled for March 08, 2011   Carlene Bickley III,Rashad Auld O 02/27/2011, 2:19 PM

## 2011-02-28 ENCOUNTER — Encounter: Payer: Self-pay | Admitting: Family Medicine

## 2011-03-05 ENCOUNTER — Encounter (HOSPITAL_COMMUNITY)
Admission: RE | Admit: 2011-03-05 | Discharge: 2011-03-05 | Disposition: A | Discharge status: Home / Self Care | Payer: BC Managed Care – PPO | Source: Ambulatory Visit | Attending: General Surgery | Admitting: General Surgery

## 2011-03-05 ENCOUNTER — Other Ambulatory Visit (HOSPITAL_COMMUNITY): Payer: Self-pay | Admitting: Psychology

## 2011-03-05 ENCOUNTER — Ambulatory Visit (HOSPITAL_COMMUNITY)
Admission: RE | Admit: 2011-03-05 | Discharge: 2011-03-05 | Disposition: A | Discharge status: Home / Self Care | Payer: BC Managed Care – PPO | Source: Ambulatory Visit | Attending: Psychology | Admitting: Psychology

## 2011-03-05 DIAGNOSIS — K639 Disease of intestine, unspecified: Secondary | ICD-10-CM | POA: Insufficient documentation

## 2011-03-05 DIAGNOSIS — I10 Essential (primary) hypertension: Secondary | ICD-10-CM | POA: Insufficient documentation

## 2011-03-05 DIAGNOSIS — K6389 Other specified diseases of intestine: Secondary | ICD-10-CM

## 2011-03-05 DIAGNOSIS — Z01812 Encounter for preprocedural laboratory examination: Secondary | ICD-10-CM | POA: Insufficient documentation

## 2011-03-05 DIAGNOSIS — Z01818 Encounter for other preprocedural examination: Secondary | ICD-10-CM | POA: Insufficient documentation

## 2011-03-05 LAB — DIFFERENTIAL
Basophils Absolute: 0.1 10*3/uL (ref 0.0–0.1)
Eosinophils Absolute: 0.2 10*3/uL (ref 0.0–0.7)
Eosinophils Relative: 2 % (ref 0–5)
Lymphocytes Relative: 32 % (ref 12–46)
Neutrophils Relative %: 56 % (ref 43–77)

## 2011-03-05 LAB — CBC
HCT: 38.6 % (ref 36.0–46.0)
Platelets: 220 10*3/uL (ref 150–400)
RBC: 4.52 MIL/uL (ref 3.87–5.11)
RDW: 13 % (ref 11.5–15.5)
WBC: 8.5 10*3/uL (ref 4.0–10.5)

## 2011-03-05 LAB — ABO/RH: ABO/RH(D): O POS

## 2011-03-05 LAB — HCG, SERUM, QUALITATIVE: Preg, Serum: NEGATIVE

## 2011-03-05 LAB — COMPREHENSIVE METABOLIC PANEL
AST: 21 U/L (ref 0–37)
Albumin: 4.1 g/dL (ref 3.5–5.2)
Calcium: 9.6 mg/dL (ref 8.4–10.5)
Creatinine, Ser: 0.59 mg/dL (ref 0.50–1.10)
Total Protein: 7.1 g/dL (ref 6.0–8.3)

## 2011-03-05 LAB — PROTIME-INR: INR: 0.88 (ref 0.00–1.49)

## 2011-03-05 LAB — TYPE AND SCREEN
ABO/RH(D): O POS
Antibody Screen: NEGATIVE

## 2011-03-05 LAB — SURGICAL PCR SCREEN: Staphylococcus aureus: NEGATIVE

## 2011-03-08 ENCOUNTER — Other Ambulatory Visit (INDEPENDENT_AMBULATORY_CARE_PROVIDER_SITE_OTHER): Payer: Self-pay | Admitting: General Surgery

## 2011-03-08 ENCOUNTER — Inpatient Hospital Stay (HOSPITAL_COMMUNITY)
Admission: RE | Admit: 2011-03-08 | Discharge: 2011-03-12 | DRG: 149 | Disposition: A | Discharge status: Home / Self Care | Payer: BC Managed Care – PPO | Source: Ambulatory Visit | Attending: General Surgery | Admitting: General Surgery

## 2011-03-08 DIAGNOSIS — D378 Neoplasm of uncertain behavior of other specified digestive organs: Secondary | ICD-10-CM

## 2011-03-08 DIAGNOSIS — D126 Benign neoplasm of colon, unspecified: Principal | ICD-10-CM | POA: Diagnosis present

## 2011-03-08 DIAGNOSIS — D371 Neoplasm of uncertain behavior of stomach: Secondary | ICD-10-CM

## 2011-03-08 DIAGNOSIS — F17200 Nicotine dependence, unspecified, uncomplicated: Secondary | ICD-10-CM | POA: Diagnosis present

## 2011-03-08 DIAGNOSIS — D375 Neoplasm of uncertain behavior of rectum: Secondary | ICD-10-CM

## 2011-03-08 DIAGNOSIS — I10 Essential (primary) hypertension: Secondary | ICD-10-CM | POA: Diagnosis present

## 2011-03-08 DIAGNOSIS — Z79899 Other long term (current) drug therapy: Secondary | ICD-10-CM

## 2011-03-08 HISTORY — PX: RIGHT COLECTOMY: SHX853

## 2011-03-09 LAB — CBC
HCT: 32.9 % — ABNORMAL LOW (ref 36.0–46.0)
MCHC: 34.3 g/dL (ref 30.0–36.0)
RDW: 13.2 % (ref 11.5–15.5)

## 2011-03-09 LAB — DIFFERENTIAL
Basophils Absolute: 0 10*3/uL (ref 0.0–0.1)
Basophils Relative: 0 % (ref 0–1)
Eosinophils Relative: 0 % (ref 0–5)
Lymphocytes Relative: 15 % (ref 12–46)
Monocytes Absolute: 1.5 10*3/uL — ABNORMAL HIGH (ref 0.1–1.0)

## 2011-03-09 LAB — BASIC METABOLIC PANEL
BUN: 8 mg/dL (ref 6–23)
GFR calc Af Amer: >60 mL/min (ref >60)
GFR calc non Af Amer: >60 mL/min (ref >60)
Potassium: 4.3 mEq/L (ref 3.5–5.1)

## 2011-03-11 NOTE — Op Note (Signed)
Tracy Brown, Tracy NO.:  [id number]  MEDICAL RECORD NO.:  [id number]  LOCATION:  5158                         FACILITY:  [facility]  PHYSICIAN:  Cherylynn Ridges, M.D.    DATE OF BIRTH:  08-22-1960  DATE OF PROCEDURE:  03/08/2011 DATE OF DISCHARGE:                              OPERATIVE REPORT   PREOPERATIVE DIAGNOSIS:  Tubulovillous adenoma of the right colon.  POSTOPERATIVE DIAGNOSIS:  Tubulovillous adenoma of the right colon.  PROCEDURE:  Partial right colectomy.  SURGEON:  Cherylynn Ridges, MD.  ASSISTANT:  Abigail Miyamoto, MD.  ANESTHESIA:  General endotracheal.  ESTIMATED BLOOD LOSS:  Less than 50 mL.  COMPLICATIONS:  None.  CONDITION:  Stable.  FINDINGS:  Evidence of metastatic disease.  The polyp was about 2 x 3 cm in size upon opening the specimen at the end of the case.  INDICATIONS FOR OPERATION:  The patient is a 50 year old female, who on a routine colonoscopy was found to have a colon mass that was biopsiedthat demonstrating a tubular adenoma.  She now comes in for resection operation.  The patient was taken to the operating room, placed on table in supine position.  After an adequate general endotracheal anesthetic was administered, she was prepped and draped in usual sterile manner, exposing her entire abdomen.  After proper time-out was performed, identifying the patient and procedure to be performed, a right periumbilical transverse incision about 6-7 cm long was made using #10 blade.  We taken down to subcutaneous tissue through the anterior rectus sheath through the rectus muscle and then the posterior rectus sheath using electrocautery. Once we were in the peritoneal cavity, we mobilized the right colon at the line of Toldt down.  We were able to palpate the tumor in the lumen of the right colon just distal to the cecum.  Initially, we had thought to perhaps removing the section without the ileocecal valve.  However, because of its  proximity to the cecum, it was felt as though better resection would be to remove the terminal ileum, the cecum, the appendix, and the proximal right colon.  We mobilized the colon and the terminal ileum.  We came across the terminal ileum and the mid right colon using a GIA 75 stapler.  The mesentery was taken using a LigaSure device and also a 2-0 silk tie for the ileocolic vessel.  We resected the specimen.  At the end of the case, we opened the specimen, demonstrating a 2 x 3 cm sessile polyp in the distal cecum proximal right descending colon.  An anastomosis was made between the mid ascending colon and the terminal ileum using a GIA 75 staple.  The enterotomy was closed using a TX 60 stapler.  We closed the mesentery using interrupted 2-0 silk sutures.  A corner stitch of 3-0 silk was placed.  Once anastomosis was completed, we changed our gloves.  We irrigated with saline solution and closed.  We closed the posterior rectus sheath using running looped 0 PDS suture. The anterior fascia was closed using looped 0 PDS suture.  The skin was then closed by using running subcuticular stitch of 3-0 Monocryl.  A 0.25%  Marcaine with epi was used as an anesthetic in the skin with total of 15 mL was used.  Dermabond, Steri-Strips, and Tegaderm were used to complete the dressing.  All counts were correct.     Cherylynn Ridges, M.D.     JOW/MEDQ  D:  03/08/2011  T:  03/08/2011  Job:  [id number]  cc:   Anselmo Rod, MD, [facility]  Electronically Signed by Jimmye Norman M.D. on 03/11/2011 03:33:29 PM

## 2011-03-13 HISTORY — PX: COLON SURGERY: SHX602

## 2011-03-26 ENCOUNTER — Encounter (INDEPENDENT_AMBULATORY_CARE_PROVIDER_SITE_OTHER): Payer: Self-pay | Admitting: General Surgery

## 2011-03-26 ENCOUNTER — Ambulatory Visit (INDEPENDENT_AMBULATORY_CARE_PROVIDER_SITE_OTHER): Payer: BC Managed Care – PPO | Admitting: General Surgery

## 2011-03-26 DIAGNOSIS — Z09 Encounter for follow-up examination after completed treatment for conditions other than malignant neoplasm: Secondary | ICD-10-CM

## 2011-03-26 NOTE — Patient Instructions (Signed)
Return to see me if bowel function does not return to normal ina bout 3 months

## 2011-03-26 NOTE — Progress Notes (Signed)
HPI The patient is doing better status post right colectomy for any tubular adenoma. She's had some problems with her bowels are beginning to lose weight to a regular. However she seems to be getting back to normal  PE Her wound is healed very well no evidence of infection no hernia. She has normal bowel sounds.  Studiy review None  Assessment  As expected status post right colectomy.  Plan Return to see me on a p.r.n. basis. I will give the patient copy of her pathology report. It is alright for her to take a new medications prescribed to her by her primary care physician and her gynecologist

## 2011-05-27 ENCOUNTER — Ambulatory Visit (INDEPENDENT_AMBULATORY_CARE_PROVIDER_SITE_OTHER): Payer: BC Managed Care – PPO | Admitting: Family Medicine

## 2011-05-27 ENCOUNTER — Encounter: Payer: Self-pay | Admitting: Family Medicine

## 2011-05-27 VITALS — BP 114/78 | HR 95 | Temp 98.5°F | Wt 135.0 lb

## 2011-05-27 DIAGNOSIS — J329 Chronic sinusitis, unspecified: Secondary | ICD-10-CM

## 2011-05-27 MED ORDER — CEFUROXIME AXETIL 500 MG PO TABS: 500.0000 mg | ORAL_TABLET | Freq: Two times a day (BID) | ORAL | Status: AC

## 2011-05-27 NOTE — Progress Notes (Signed)
  Subjective:    Patient ID: Tracy Brown, female    DOB: 16-Jul-1961, 50 y.o.   MRN: [id number]  HPI Here for one week of sinus pressure, HA, PND, and ST. No cough or fever. On Zyrtec and Mucinex.    Review of Systems  Constitutional: Negative.   HENT: Positive for congestion, postnasal drip and sinus pressure.   Eyes: Negative.   Respiratory: Negative.        Objective:   Physical Exam  Constitutional: She appears well-developed and well-nourished.  HENT:  Right Ear: External ear normal.  Left Ear: External ear normal.  Nose: Nose normal.  Mouth/Throat: Oropharynx is clear and moist. No oropharyngeal exudate.  Eyes: Conjunctivae are normal. Pupils are equal, round, and reactive to light.  Neck: Neck supple. No thyromegaly present.  Pulmonary/Chest: Effort normal and breath sounds normal.  Lymphadenopathy:    She has no cervical adenopathy.          Assessment & Plan:  Rest, fluids

## 2011-05-28 NOTE — Discharge Summary (Signed)
  NAMERIN, GORTON NO.:  [id number]  MEDICAL RECORD NO.:  [id number]  LOCATION:  5158                         FACILITY:  [facility]  PHYSICIAN:  Cherylynn Ridges, M.D.    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  PRINCIPAL PROCEDURE:  Right colectomy.  SURGEON:  Marta Lamas. Lindie Spruce, M.D.  DIET ON DISCHARGE:  Soft.  CONDITION:  Stable.  She was to follow-up to see Dr. Lindie Spruce in 2 weeks.  Her pain was minimal and she did not require any pain medicine for home.  Incision was clean and dry with a dissolvable stitches.  BRIEF SUMMARY OF HOSPITAL COURSE:  The patient is a 50 year old female who was admitted with a non-colonoscopically resectable right colon mass, which subsequently came to surgery.  She is admitted day of surgery for right colectomy.  Through a right transverse incision, a partial right colectomy was performed including the ileocecal valve and cecum.  Postoperatively, the patient did well and was discharged home on 4 days.  She was on Alvimopan postoperatively to promote bowel motility. She was having bowel movements by postop day #3.  She was discharged home, doing well.  Pathology confirmed a tubulovillous adenoma of 2.5 cm in size.     Cherylynn Ridges, M.D.     JOW/MEDQ  D:  05/02/2011  T:  05/02/2011  Job:  [id number]  Electronically Signed by Jimmye Norman M.D. on 05/28/2011 07:25:35 AM

## 2011-06-25 ENCOUNTER — Encounter (INDEPENDENT_AMBULATORY_CARE_PROVIDER_SITE_OTHER): Payer: Self-pay | Admitting: General Surgery

## 2011-06-25 ENCOUNTER — Ambulatory Visit (INDEPENDENT_AMBULATORY_CARE_PROVIDER_SITE_OTHER): Payer: Self-pay | Admitting: General Surgery

## 2011-06-25 DIAGNOSIS — G8918 Other acute postprocedural pain: Secondary | ICD-10-CM

## 2011-06-25 NOTE — Progress Notes (Signed)
HPI The patient comes in with a complaint of some supraumbilical discomfort and pain along with some peri-incisional pain. She's had no diarrhea recently and a lot more gas.  PE On examination there is no palpable hernia. She's got good bowel sounds.  Studiy review None  Assessment Postoperative  Incisional pain without evidence of hernia.  Plan I will reassess her again in 3 months if this has not resolved. I do not suspect this is a serious problem and may be related to internal scar tissue

## 2011-07-01 ENCOUNTER — Telehealth: Payer: Self-pay | Admitting: Family Medicine

## 2011-07-01 NOTE — Telephone Encounter (Signed)
Pt was in a month ago for allergies and was given and rx pt completed the meds and is still having the same issue and would like to have an rx called in for her. Please contact.

## 2011-07-01 NOTE — Telephone Encounter (Signed)
Please ask her what her symptoms are now. What are we treating?

## 2011-07-02 NOTE — Telephone Encounter (Signed)
Pt called in today. Sore throat and ear pain is still present. Refuses ov. Pt would like something else called in to [facility]. Thanks. She stated that she came in about a month ago with the same sxs.

## 2011-07-03 MED ORDER — AMOXICILLIN-POT CLAVULANATE 875-125 MG PO TABS: 1.0000 | ORAL_TABLET | Freq: Two times a day (BID) | ORAL | Status: AC

## 2011-07-03 NOTE — Telephone Encounter (Signed)
rx sent to pharmacy

## 2011-07-03 NOTE — Telephone Encounter (Signed)
Call in Augmentin 875 bid for 10 days  

## 2011-12-27 ENCOUNTER — Ambulatory Visit (INDEPENDENT_AMBULATORY_CARE_PROVIDER_SITE_OTHER): Payer: BC Managed Care – PPO | Admitting: Family Medicine

## 2011-12-27 ENCOUNTER — Encounter: Payer: Self-pay | Admitting: Family Medicine

## 2011-12-27 VITALS — BP 114/78 | HR 86 | Temp 98.4°F | Wt 132.0 lb

## 2011-12-27 DIAGNOSIS — M542 Cervicalgia: Secondary | ICD-10-CM

## 2011-12-27 MED ORDER — DICLOFENAC SODIUM 75 MG PO TBEC: 75.0000 mg | DELAYED_RELEASE_TABLET | Freq: Two times a day (BID) | ORAL | Status: AC

## 2011-12-27 MED ORDER — CYCLOBENZAPRINE HCL 10 MG PO TABS: 10.0000 mg | ORAL_TABLET | Freq: Three times a day (TID) | ORAL | Status: AC | PRN

## 2011-12-27 NOTE — Progress Notes (Signed)
  Subjective:    Patient ID: Tracy Brown, female    DOB: 1960-12-11, 51 y.o.   MRN: [id number]  HPI Here for 2 weeks of stiffness and pain in the neck, with muscle spasms over the shoulders and intermittent numbness down both arms. No weakness. No recent trauma. Using Advil.    Review of Systems  Constitutional: Negative.        Objective:   Physical Exam  Constitutional: She appears well-developed and well-nourished.  Musculoskeletal:       Tender in the lower posterior neck with spasm in the upper trapezius muscles. Full ROM          Assessment & Plan:  Use heat, get a massage. Recheck prn

## 2011-12-30 ENCOUNTER — Ambulatory Visit: Payer: BC Managed Care – PPO | Admitting: Family Medicine

## 2012-01-11 ENCOUNTER — Other Ambulatory Visit: Payer: Self-pay | Admitting: Family Medicine

## 2012-02-24 ENCOUNTER — Other Ambulatory Visit: Payer: Self-pay | Admitting: Obstetrics & Gynecology

## 2012-02-24 DIAGNOSIS — Z1231 Encounter for screening mammogram for malignant neoplasm of breast: Secondary | ICD-10-CM

## 2012-03-05 ENCOUNTER — Ambulatory Visit
Admission: RE | Admit: 2012-03-05 | Discharge: 2012-03-05 | Disposition: A | Discharge status: Home / Self Care | Payer: BC Managed Care – PPO | Source: Ambulatory Visit | Attending: Obstetrics & Gynecology | Admitting: Obstetrics & Gynecology

## 2012-03-05 DIAGNOSIS — Z1231 Encounter for screening mammogram for malignant neoplasm of breast: Secondary | ICD-10-CM

## 2012-03-16 ENCOUNTER — Telehealth: Payer: Self-pay | Admitting: Family Medicine

## 2012-03-16 MED ORDER — CEFUROXIME AXETIL 500 MG PO TABS: 500.0000 mg | ORAL_TABLET | Freq: Two times a day (BID) | ORAL | Status: AC

## 2012-03-16 NOTE — Telephone Encounter (Signed)
Dr. Clent Ridges, please advise. Pt requesting abx with no OV - see below. Thanks.

## 2012-03-16 NOTE — Telephone Encounter (Signed)
Call in Ceftin 500 mg bid for 10 days  

## 2012-03-16 NOTE — Telephone Encounter (Signed)
Caller: Trisha/Patient; PCP: Nelwyn Salisbury.; CB#: [phone];  Call regarding Sore Throat; Sore throat, ear pain.  Onset 03/12/12.  03/14/12 congestion and runny nose, hoarse voice.  Afebrile.  States she gets this a couple of times a year and needs antibiotic.  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  Per Upper Respiratory Infection Protocol all emergent symptoms ruled out with exception of "Productive cough with colored sputum."  Appointment advised within 24 hrs.  Patient would like office to call her regarding a prescription without an office visit.  Patient was advised we do not have standing orders to call in antibiotics.   Patient ask if someone can please call today, she would like to avoid going to [facility] if possible.

## 2012-03-16 NOTE — Telephone Encounter (Signed)
I sent script e-scribe and left voice message for pt 

## 2012-04-06 ENCOUNTER — Encounter (INDEPENDENT_AMBULATORY_CARE_PROVIDER_SITE_OTHER): Payer: Self-pay

## 2012-09-03 ENCOUNTER — Ambulatory Visit (INDEPENDENT_AMBULATORY_CARE_PROVIDER_SITE_OTHER): Payer: 59 | Admitting: Family Medicine

## 2012-09-03 ENCOUNTER — Encounter: Payer: Self-pay | Admitting: Family Medicine

## 2012-09-03 VITALS — BP 98/60 | HR 80 | Temp 98.4°F | Wt 145.0 lb

## 2012-09-03 DIAGNOSIS — J309 Allergic rhinitis, unspecified: Secondary | ICD-10-CM

## 2012-09-03 DIAGNOSIS — J069 Acute upper respiratory infection, unspecified: Secondary | ICD-10-CM

## 2012-09-03 MED ORDER — FLUTICASONE PROPIONATE 50 MCG/ACT NA SUSP: 2.0000 | Freq: Every day | NASAL | Status: DC

## 2012-09-03 NOTE — Progress Notes (Signed)
Chief Complaint  Patient presents with  . Sinusitis    HPI:  Acute visit for congestion and cough: -started: 3 days ago -symptoms:nasal congestion, sore throat, cough, HA, ears filled up -denies:fever, SOB, NVD, tooth pain -has tried: tried robitussin, advil, OTC meds, allegra -sick contacts: people at work -Hx of: allergies - and stopped her allegra recently  ROS: See pertinent positives and negatives per HPI.  Past Medical History  Diagnosis Date  . Chickenpox   . Allergy   . Anemia   . Colon polyps   . Hypertension   . Nephrolithiasis   . OA (osteoarthritis)   . History of recurrent UTIs   . Heart murmur     Family History  Problem Relation Age of Onset  . Cancer Other     breast,colon,ovarian  . Coronary artery disease Other   . Hypertension Other   . Stroke Other   . Leukemia Father     History   Social History  . Marital Status: Divorced    Spouse Name: N/A    Number of Children: N/A  . Years of Education: N/A   Social History Main Topics  . Smoking status: Former Games developer  . Smokeless tobacco: Never Used  . Alcohol Use: 0.0 oz/week     Comment: once a month  . Drug Use: No  . Sexually Active: None   Other Topics Concern  . None   Social History Narrative  . None    Current outpatient prescriptions:Acetaminophen (TYLENOL PO), Take by mouth as needed.  , Disp: , Rfl: ;  calcium carbonate (OS-CAL) 600 MG TABS, Take 600 mg by mouth daily. , Disp: , Rfl: ;  Cetirizine HCl (ZYRTEC PO), Take by mouth as needed. , Disp: , Rfl: ;  fish oil-omega-3 fatty acids 1000 MG capsule, Take 2 g by mouth daily.  , Disp: , Rfl:  hydrochlorothiazide (MICROZIDE) 12.5 MG capsule, take 1 capsule by mouth every morning, Disp: 30 capsule, Rfl: 11;  ibuprofen (ADVIL,MOTRIN) 200 MG tablet, Take 200 mg by mouth every 6 (six) hours as needed., Disp: , Rfl: ;  Multiple Vitamins-Minerals (MULTIVITAMIN,TX-MINERALS) tablet, Take 1 tablet by mouth daily.  , Disp: , Rfl: ;  Nicotine  (NICODERM CQ TD), Place onto the skin.  , Disp: , Rfl:  psyllium (METAMUCIL) 58.6 % powder, Take 1 packet by mouth daily.  , Disp: , Rfl: ;  diclofenac (VOLTAREN) 75 MG EC tablet, Take 1 tablet (75 mg total) by mouth 2 (two) times daily., Disp: 60 tablet, Rfl: 5;  fluticasone (FLONASE) 50 MCG/ACT nasal spray, Place 2 sprays into the nose daily., Disp: 16 g, Rfl: 6  EXAM:  Filed Vitals:   09/03/12 1547  BP: 98/60  Pulse: 80  Temp: 98.4 F (36.9 C)    There is no height on file to calculate BMI.  GENERAL: vitals reviewed and listed above, alert, oriented, appears well hydrated and in no acute distress  HEENT: atraumatic, conjunttiva clear, no obvious abnormalities on inspection of external nose and ears, normal appearance of ear canals and TMs, clear nasal congestion with pale boggy turbinates, mild post oropharyngeal erythema with PND, no tonsillar edema or exudate, no sinus TTP  NECK: no obvious masses on inspection  LUNGS: clear to auscultation bilaterally, no wheezes, rales or rhonchi, good air movement  CV: HRRR, no peripheral edema  MS: moves all extremities without noticeable abnormality  PSYCH: pleasant and cooperative, no obvious depression or anxiety  ASSESSMENT AND PLAN:  Discussed the following assessment  and plan:  1. Allergic rhinitis  fluticasone (FLONASE) 50 MCG/ACT nasal spray  2. Upper respiratory infection  fluticasone (FLONASE) 50 MCG/ACT nasal spray   -uncontrolled AR versus VURI - tx per below, return precations -Patient advised to return or notify a doctor immediately if symptoms worsen or persist or new concerns arise.  Patient Instructions  INSTRUCTIONS FOR UPPER RESPIRATORY INFECTION:  -plenty of rest and fluids  -FLONASE 2 SPRAYS EACH NOSTRIL DAILY  -ALLEGRA DAILY  -nasal saline wash 2-3 times daily (use prepackaged nasal saline or bottled/distilled water if making your own)   -clean nose with nasal saline before using the nasal steroid  (FLONASE) or sinex  -can use over the counter nasal decongestant spray for drainage and nasal congestion - but do NOT use longer then 3-4 days  -can use tylenol or ibuprofen as directed for aches and sorethroat  -in the winter time, using a humidifier at night is helpful (please follow cleaning instructions)  -if you are taking a cough medication - use only as directed, may also try a teaspoon of honey to coat the throat and throat lozenges  -for sore throat, salt water gargles can help  -follow up if you have fevers, facial pain, tooth pain, difficulty breathing or are worsening or not getting better in 5-7 days      Carrington Mullenax R.

## 2012-09-03 NOTE — Patient Instructions (Signed)
INSTRUCTIONS FOR UPPER RESPIRATORY INFECTION:  -plenty of rest and fluids  -FLONASE 2 SPRAYS EACH NOSTRIL DAILY  -ALLEGRA DAILY  -nasal saline wash 2-3 times daily (use prepackaged nasal saline or bottled/distilled water if making your own)   -clean nose with nasal saline before using the nasal steroid (FLONASE) or sinex  -can use over the counter nasal decongestant spray for drainage and nasal congestion - but do NOT use longer then 3-4 days  -can use tylenol or ibuprofen as directed for aches and sorethroat  -in the winter time, using a humidifier at night is helpful (please follow cleaning instructions)  -if you are taking a cough medication - use only as directed, may also try a teaspoon of honey to coat the throat and throat lozenges  -for sore throat, salt water gargles can help  -follow up if you have fevers, facial pain, tooth pain, difficulty breathing or are worsening or not getting better in 5-7 days

## 2012-09-07 ENCOUNTER — Telehealth: Payer: Self-pay | Admitting: Family Medicine

## 2012-09-07 NOTE — Telephone Encounter (Signed)
Left a message for pt to return call 

## 2012-09-07 NOTE — Telephone Encounter (Signed)
Patient Information:  Caller Name: [name]  Phone: [phone]  Patient: Tracy Brown,   Gender: Female  DOB: May 22, 1961  Age: 52 Years  PCP: Gershon Crane [facility])  Pregnant: No  Office Follow Up:  Does the office need to follow up with this patient?: Yes  Instructions For The Office: requesting antibiotic   Symptoms  Reason For Call & Symptoms: Calling about seen in office on 09/03/12 for cough, congestion and ear pain and her throat is hurting. Cough is productive for greenish mucos and she has headaches and occasional dizziness. Prescribed Flonase and not helping. Requesting Antibiotic called into [facility] at [facility].  Reviewed Health History In EMR: Yes  Reviewed Medications In EMR: Yes  Reviewed Allergies In EMR: Yes  Reviewed Surgeries / Procedures: Yes  Date of Onset of Symptoms: 08/31/2012  Treatments Tried: Advil 2 tabs PO prn, Nasal Spray, Allegra, Robitussin, Musinex Cold  Treatments Tried Worked: No OB / GYN:  LMP: Unknown  Guideline(s) Used:  Cough  Ear - Congestion  Disposition Per Guideline:   See Today in Office  Reason For Disposition Reached:   Earache lasts > 1 hour  Advice Given:  Reassurance:  Definition: Ear congestion is the medical term used to describe symptoms of a stuffy, full, or plugged sensation in the ear. People also sometimes describe crackling or popping noises in the ear. Sometimes hearing seems slightly muffled.  Eustacian tube: There is a small collapsible tube that runs between the middle ear and the nose. Normally, it permits tiny amounts of air to move in and out of the middle ear. When the tube gets blocked, air or fluid can build up behind the ear drum (tympanic membrane). This causes the symptoms of ear congestion.  Causes  Upper respiratory infections (colds) and nasal allergies (hay fever) can block the eustachian tube.  Blowing the nose too hard can also push air and fluid into the eustachian tube.  Call Back If:   Ear  congestion lasts over 48 hours  Ear pain or fever occurs  You become worse.  Patient Refused Recommendation:  Patient Requests Prescription  Requesting Antibiotic be called into [facility] [address]

## 2012-09-07 NOTE — Telephone Encounter (Signed)
Call in Ceftin 500 mg bid for 10 days  

## 2012-09-07 NOTE — Telephone Encounter (Signed)
All symptoms and her exam a few days ago are common with a "cold." Antibiotics do not fix colds and can cause many side effects. Most colds last 1-2 weeks and sometimes longer. If she is worsening, should be seen to evaluate if something else is going on.

## 2012-09-07 NOTE — Telephone Encounter (Signed)
pls advise

## 2012-09-07 NOTE — Telephone Encounter (Signed)
Called and spoke with pt and pt states that she wanted the message sent to Dr. Clent Ridges instead of Dr. Selena Batten.  Advised pt that  This message was sent to Dr. Selena Batten because she last saw her for her acute visit.  Pt states she will see Dr. Clent Ridges in the future and would like him to answer this message.  Pt states the reason she came in was so she could get an abx because she always has these symptoms and is given an antibiotic. Pt would like Dr. Clent Ridges to answer this message. Pls advise.

## 2012-09-08 MED ORDER — CEFUROXIME AXETIL 500 MG PO TABS: 500.0000 mg | ORAL_TABLET | Freq: Two times a day (BID) | ORAL | Status: DC

## 2012-09-08 NOTE — Telephone Encounter (Signed)
Called and spoke with pt and pt is aware that rx for cetftin sent to pharmacy.

## 2012-09-08 NOTE — Addendum Note (Signed)
Addended by: Azucena Freed on: 09/08/2012 10:48 AM   Modules accepted: Orders

## 2012-10-19 ENCOUNTER — Ambulatory Visit (INDEPENDENT_AMBULATORY_CARE_PROVIDER_SITE_OTHER): Payer: 59 | Admitting: Family Medicine

## 2012-10-19 ENCOUNTER — Encounter: Payer: Self-pay | Admitting: Family Medicine

## 2012-10-19 VITALS — BP 100/64 | HR 87 | Temp 98.6°F | Wt 136.0 lb

## 2012-10-19 DIAGNOSIS — R222 Localized swelling, mass and lump, trunk: Secondary | ICD-10-CM

## 2012-10-19 DIAGNOSIS — R42 Dizziness and giddiness: Secondary | ICD-10-CM

## 2012-10-19 MED ORDER — METHYLPREDNISOLONE 4 MG PO KIT: PACK | ORAL | Status: AC

## 2012-10-19 MED ORDER — MECLIZINE HCL 25 MG PO TABS: 25.0000 mg | ORAL_TABLET | ORAL | Status: DC | PRN

## 2012-10-19 NOTE — Progress Notes (Signed)
  Subjective:    Patient ID: Tracy Brown, female    DOB: [birth year], 52 y.o.   MRN: [id number]  HPIHere for 2 months of intermittent dizziness which sounds like classic vertigo. It is worse when she moves her head too quickly or when she gets up or down. No HA or vision problems. Also she asks me to look at her left upper neck area again. We had discussed this sveral months ago but the area has not gone down at all. It is swollen and a bit tender. No boils or lesions seen. This area simply seems to be swollen and larger than the area on the right. No SOB or fever or coughing.    Review of Systems  Constitutional: Negative.   HENT: Positive for congestion, rhinorrhea, postnasal drip and tinnitus.   Eyes: Negative.   Respiratory: Negative.   Neurological: Positive for dizziness. Negative for tremors, seizures, syncope, facial asymmetry, speech difficulty, weakness, light-headedness, numbness and headaches.  Hematological: Positive for adenopathy.       Objective:   Physical Exam  Constitutional: She is oriented to person, place, and time. She appears well-developed and well-nourished.  HENT:  Head: Normocephalic and atraumatic.  Right Ear: External ear normal.  Left Ear: External ear normal.  Nose: Nose normal.  Mouth/Throat: Oropharynx is clear and moist. No oropharyngeal exudate.  Eyes: Conjunctivae are normal. Pupils are equal, round, and reactive to light.  Neck: Neck supple. No thyromegaly present.  Cardiovascular: Normal rate, regular rhythm, normal heart sounds and intact distal pulses.   Pulmonary/Chest: Effort normal and breath sounds normal.  There is some asymmetric fullness in the left supraclavicular area, no tenderness. No defied nodes or masses.   Lymphadenopathy:    She has no cervical adenopathy.  Neurological: She is alert and oriented to person, place, and time. She has normal reflexes. No cranial nerve deficit. She exhibits normal muscle tone. Coordination normal.           Assessment & Plan:  She has positional veritgo. She will start on Allegra D daily and we will give her a medrol dose pack. Add meclizine prn. Rest, drink fluids. Get CT scans of the neck and chest

## 2012-10-22 NOTE — Addendum Note (Signed)
Addended by: Gershon Crane A on: 10/22/2012 12:32 PM   Modules accepted: Orders

## 2012-10-26 ENCOUNTER — Ambulatory Visit (INDEPENDENT_AMBULATORY_CARE_PROVIDER_SITE_OTHER)
Admission: RE | Admit: 2012-10-26 | Discharge: 2012-10-26 | Disposition: A | Discharge status: Home / Self Care | Payer: 59 | Source: Ambulatory Visit | Attending: Family Medicine | Admitting: Family Medicine

## 2012-10-26 DIAGNOSIS — R222 Localized swelling, mass and lump, trunk: Secondary | ICD-10-CM

## 2012-10-26 MED ORDER — IOHEXOL 300 MG/ML  SOLN
80.0000 mL | Freq: Once | INTRAMUSCULAR | Status: AC | PRN
  Administered 2012-10-26: 80 mL via INTRAVENOUS

## 2012-10-27 NOTE — Progress Notes (Signed)
Quick Note:  I spoke with pt ______ 

## 2012-10-28 ENCOUNTER — Other Ambulatory Visit: Payer: 59

## 2012-11-02 ENCOUNTER — Other Ambulatory Visit: Payer: 59

## 2012-11-05 ENCOUNTER — Telehealth: Payer: Self-pay | Admitting: Family Medicine

## 2012-11-05 NOTE — Telephone Encounter (Signed)
FYI

## 2012-11-05 NOTE — Telephone Encounter (Signed)
We received notification that the pt did not complete the chest films ordered by Dr. Clent Ridges on 10/22/12. Please call pt to complete or cancel order if not needed. Thank you.

## 2012-11-06 NOTE — Telephone Encounter (Signed)
I spoke with pt and she was not aware that this test was needed. Does she need to have this done now?

## 2012-11-06 NOTE — Telephone Encounter (Signed)
Please call the patient to find out why she did not go

## 2012-11-06 NOTE — Telephone Encounter (Signed)
Tell her to get the CXR next week so we can finish our workup

## 2012-11-06 NOTE — Telephone Encounter (Signed)
I spoke with pt and she is going to get this done next week.

## 2012-11-09 ENCOUNTER — Ambulatory Visit (INDEPENDENT_AMBULATORY_CARE_PROVIDER_SITE_OTHER)
Admission: RE | Admit: 2012-11-09 | Discharge: 2012-11-09 | Disposition: A | Discharge status: Home / Self Care | Payer: 59 | Source: Ambulatory Visit | Attending: Family Medicine | Admitting: Family Medicine

## 2012-11-09 DIAGNOSIS — R222 Localized swelling, mass and lump, trunk: Secondary | ICD-10-CM

## 2012-11-09 NOTE — Progress Notes (Signed)
Quick Note:  I spoke with pt ______ 

## 2013-01-12 ENCOUNTER — Other Ambulatory Visit: Payer: Self-pay | Admitting: Family Medicine

## 2013-02-15 ENCOUNTER — Other Ambulatory Visit: Payer: Self-pay

## 2013-02-15 DIAGNOSIS — Z1231 Encounter for screening mammogram for malignant neoplasm of breast: Secondary | ICD-10-CM

## 2013-03-08 ENCOUNTER — Ambulatory Visit
Admission: RE | Admit: 2013-03-08 | Discharge: 2013-03-08 | Disposition: A | Discharge status: Home / Self Care | Payer: 59 | Source: Ambulatory Visit

## 2013-03-08 DIAGNOSIS — Z1231 Encounter for screening mammogram for malignant neoplasm of breast: Secondary | ICD-10-CM

## 2013-03-30 ENCOUNTER — Other Ambulatory Visit (INDEPENDENT_AMBULATORY_CARE_PROVIDER_SITE_OTHER): Payer: 59

## 2013-03-30 DIAGNOSIS — Z0000 Encounter for general adult medical examination without abnormal findings: Secondary | ICD-10-CM

## 2013-03-30 LAB — CBC WITH DIFFERENTIAL/PLATELET
Basophils Absolute: 0 10*3/uL (ref 0.0–0.1)
Eosinophils Absolute: 0.1 10*3/uL (ref 0.0–0.7)
Hemoglobin: 13.5 g/dL (ref 12.0–15.0)
Lymphocytes Relative: 32.6 % (ref 12.0–46.0)
MCHC: 33.9 g/dL (ref 30.0–36.0)
MCV: 86.7 fl (ref 78.0–100.0)
Monocytes Absolute: 0.4 10*3/uL (ref 0.1–1.0)
Neutro Abs: 4.7 10*3/uL (ref 1.4–7.7)
Neutrophils Relative %: 60.4 % (ref 43.0–77.0)
RDW: 13.8 % (ref 11.5–14.6)

## 2013-03-30 LAB — LIPID PANEL
Cholesterol: 195 mg/dL (ref 0–200)
LDL Cholesterol: 122 mg/dL — ABNORMAL HIGH (ref 0–99)
Triglycerides: 114 mg/dL (ref 0.0–149.0)

## 2013-03-30 LAB — POCT URINALYSIS DIPSTICK
Bilirubin, UA: NEGATIVE
Blood, UA: NEGATIVE
Glucose, UA: NEGATIVE
Ketones, UA: NEGATIVE
Spec Grav, UA: 1.025
Urobilinogen, UA: 0.2

## 2013-03-30 LAB — BASIC METABOLIC PANEL
Chloride: 109 mEq/L (ref 96–112)
Creatinine, Ser: 0.8 mg/dL (ref 0.4–1.2)
GFR: 81.33 mL/min (ref >60.00)

## 2013-03-30 LAB — HEPATIC FUNCTION PANEL
ALT: 18 U/L (ref 0–35)
AST: 19 U/L (ref 0–37)
Alkaline Phosphatase: 53 U/L (ref 39–117)
Bilirubin, Direct: 0 mg/dL (ref 0.0–0.3)
Total Bilirubin: 0.4 mg/dL (ref 0.3–1.2)

## 2013-03-30 LAB — TSH: TSH: 2.12 u[IU]/mL (ref 0.35–5.50)

## 2013-04-05 ENCOUNTER — Ambulatory Visit (INDEPENDENT_AMBULATORY_CARE_PROVIDER_SITE_OTHER): Payer: 59 | Admitting: Family Medicine

## 2013-04-05 ENCOUNTER — Encounter: Payer: Self-pay | Admitting: Family Medicine

## 2013-04-05 VITALS — BP 110/76 | HR 84 | Temp 98.6°F | Ht 61.5 in | Wt 138.0 lb

## 2013-04-05 DIAGNOSIS — Z0000 Encounter for general adult medical examination without abnormal findings: Secondary | ICD-10-CM

## 2013-04-05 MED ORDER — FLUCONAZOLE 150 MG PO TABS: 150.0000 mg | ORAL_TABLET | Freq: Once | ORAL | Status: DC

## 2013-04-05 NOTE — Progress Notes (Signed)
  Subjective:    Patient ID: Tracy Brown, female    DOB: November 04, 1960, 52 y.o.   MRN: [id number]  HPI 52 yr old female for a cpx. She feels well.    Review of Systems  Constitutional: Negative.   HENT: Negative.   Eyes: Negative.   Respiratory: Negative.   Cardiovascular: Negative.   Gastrointestinal: Negative.   Genitourinary: Negative for dysuria, urgency, frequency, hematuria, flank pain, decreased urine volume, enuresis, difficulty urinating, pelvic pain and dyspareunia.  Musculoskeletal: Negative.   Skin: Negative.   Neurological: Negative.   Psychiatric/Behavioral: Negative.        Objective:   Physical Exam  Constitutional: She is oriented to person, place, and time. She appears well-developed and well-nourished. No distress.  HENT:  Head: Normocephalic and atraumatic.  Right Ear: External ear normal.  Left Ear: External ear normal.  Nose: Nose normal.  Mouth/Throat: Oropharynx is clear and moist. No oropharyngeal exudate.  Eyes: Conjunctivae and EOM are normal. Pupils are equal, round, and reactive to light. No scleral icterus.  Neck: Normal range of motion. Neck supple. No JVD present. No thyromegaly present.  Cardiovascular: Normal rate, regular rhythm, normal heart sounds and intact distal pulses.  Exam reveals no gallop and no friction rub.   No murmur heard. EKG normal   Pulmonary/Chest: Effort normal and breath sounds normal. No respiratory distress. She has no wheezes. She has no rales. She exhibits no tenderness.  Abdominal: Soft. Bowel sounds are normal. She exhibits no distension and no mass. There is no tenderness. There is no rebound and no guarding.  Musculoskeletal: Normal range of motion. She exhibits no edema and no tenderness.  Lymphadenopathy:    She has no cervical adenopathy.  Neurological: She is alert and oriented to person, place, and time. She has normal reflexes. No cranial nerve deficit. She exhibits normal muscle tone. Coordination normal.   Skin: Skin is warm and dry. No rash noted. No erythema.  Psychiatric: She has a normal mood and affect. Her behavior is normal. Judgment and thought content normal.          Assessment & Plan:  Well exam.

## 2013-04-09 ENCOUNTER — Telehealth: Payer: Self-pay | Admitting: Family Medicine

## 2013-04-09 MED ORDER — FLUCONAZOLE 150 MG PO TABS: 150.0000 mg | ORAL_TABLET | Freq: Every day | ORAL | Status: DC

## 2013-04-09 NOTE — Telephone Encounter (Signed)
Tell her to use the Diflucan once daily every day until the symptoms are gone

## 2013-04-09 NOTE — Telephone Encounter (Signed)
I sent script e-scribe and spoke with pt. 

## 2013-04-09 NOTE — Telephone Encounter (Signed)
Per Dr. Clent Ridges, send in a new script for # 10 with 5 refills.

## 2013-04-09 NOTE — Telephone Encounter (Signed)
Patient Information:  Caller Name: Donicia  Phone: [phone]  Patient: Tracy Brown, Tracy Brown  Gender: Female  DOB: Aug 17, 1960  Age: 52 Years  PCP: Gershon Crane [facility])  Pregnant: No  Office Follow Up:  Does the office need to follow up with this patient?: Yes  Instructions For The Office: Please call back with MD recommendation for ongoing symptoms.  RN Note:  Minimal white vagina discharge without odor and ongoing vaginal itching/burning. Use Diflucan once 04/05/13 with some improvement but symptoms are not gone.  Concerned about approaching holiday weekend.  Requests MD advise if should repeat Diflucan dose now or wait a specific period of time or if she should try something else. Please call back.  Symptoms  Reason For Call & Symptoms: Called for medication advice.  Reports symptoms of vaginal yeast infection minimally improved after one dose of Diflucan taken 04/05/13. Rx has refills.  Uncertian if should use again now.    Reviewed Health History In EMR: Yes  Reviewed Medications In EMR: Yes  Reviewed Allergies In EMR: Yes  Reviewed Surgeries / Procedures: No  Date of Onset of Symptoms: 04/02/2013  Treatments Tried: Diflucan 04/05/13  Treatments Tried Worked: No OB / GYN:  LMP: Unknown  Guideline(s) Used:  Vaginal Discharge  Disposition Per Guideline:   See Within 3 Days in Office  Reason For Disposition Reached:   Symptoms of a yeast infection" (i.e., itchy, white discharge, not bad smelling) and not improved > 3 days following Care Advice  Advice Given:  Antifungal Medication for Vaginal Yeast Infection:   Available in the [place]: Femstat-3, miconazole (Monistat-3), clotrimazole (Gyne-Lotrimin-3, Mycelex-7), butoconazole (Femstat-3).  Genital Hygiene:   Keep your genital area clean. Wash daily.  Keep your genital area dry. Wear cotton underwear or underwear with a cotton crotch.  Do not douche.  Do not use feminine hygiene products.  Call Back If:  There  is no improvement after treating yourself for a vaginal yeast infection  You become worse.  RN Overrode Recommendation:  Follow Up With Office Later  Office to call back with MD recommendtion for patient examined 04/05/13.

## 2013-05-03 DIAGNOSIS — Z0279 Encounter for issue of other medical certificate: Secondary | ICD-10-CM

## 2013-07-06 ENCOUNTER — Encounter: Payer: Self-pay | Admitting: Family Medicine

## 2013-07-06 ENCOUNTER — Ambulatory Visit (INDEPENDENT_AMBULATORY_CARE_PROVIDER_SITE_OTHER): Payer: 59 | Admitting: Family Medicine

## 2013-07-06 VITALS — BP 124/80 | HR 88 | Temp 98.0°F | Wt 141.0 lb

## 2013-07-06 DIAGNOSIS — J019 Acute sinusitis, unspecified: Secondary | ICD-10-CM

## 2013-07-06 MED ORDER — AMOXICILLIN-POT CLAVULANATE 875-125 MG PO TABS: 1.0000 | ORAL_TABLET | Freq: Two times a day (BID) | ORAL | Status: DC

## 2013-07-06 NOTE — Progress Notes (Signed)
  Subjective:    Patient ID: NAYDEEN SPEIRS, female    DOB: [birth year], 52 y.o.   MRN: [id number]  HPI Here for 2 weeks of sinus prssure, PND, and a dry cough. No fever.    Review of Systems  Constitutional: Negative.   HENT: Positive for congestion, postnasal drip and sinus pressure.   Eyes: Negative.   Respiratory: Negative.        Objective:   Physical Exam  Constitutional: She appears well-developed and well-nourished.  HENT:  Right Ear: External ear normal.  Left Ear: External ear normal.  Nose: Nose normal.  Mouth/Throat: Oropharynx is clear and moist.  Eyes: Conjunctivae are normal.  Pulmonary/Chest: Effort normal and breath sounds normal.  Lymphadenopathy:    She has no cervical adenopathy.          Assessment & Plan:  Add Mucinex

## 2013-07-06 NOTE — Progress Notes (Signed)
Pre visit review using our clinic review tool, if applicable. No additional management support is needed unless otherwise documented below in the visit note. 

## 2013-07-28 ENCOUNTER — Telehealth: Payer: Self-pay | Admitting: Family Medicine

## 2013-07-28 NOTE — Telephone Encounter (Signed)
Patient Information:  Caller Name: Jaclynne  Phone: [phone]  Patient: Tracy Brown, Tracy Brown  Gender: Female  DOB: [birth year]  Age: 52 Years  PCP: Gershon Crane [facility])  Pregnant: No  Office Follow Up:  Does the office need to follow up with this patient?: No  Instructions For The Office: N/A  RN Note:  Information on transmission of chicken pox from shingles explained to patient, will not transmit shingles to anyone.  Information from [employer].  Symptoms  Reason For Call & Symptoms: Friend  who will be at same party with patient noticed rash on her back, was diagnosed today with shingles and started Antiviral Rx. Patient's mother does not remember if patient had chicken pox as a child.  Asking about risk of exposure to shingles and chicken pox if she goes to the party.  Reviewed Health History In EMR: No  Reviewed Medications In EMR: No  Reviewed Allergies In EMR: No  Reviewed Surgeries / Procedures: No  Date of Onset of Symptoms: Unknown OB / GYN:  LMP: Unknown  Guideline(s) Used:  No Protocol Available - Information Only  Disposition Per Guideline:   Home Care  Reason For Disposition Reached:   Information only question and nurse able to answer  Advice Given:  N/A  Patient Will Follow Care Advice:  YES

## 2013-07-29 NOTE — Telephone Encounter (Signed)
Noted  

## 2013-09-23 ENCOUNTER — Ambulatory Visit: Payer: 59 | Admitting: Internal Medicine

## 2013-09-30 ENCOUNTER — Other Ambulatory Visit: Payer: Self-pay | Admitting: Gastroenterology

## 2013-09-30 DIAGNOSIS — R1031 Right lower quadrant pain: Secondary | ICD-10-CM

## 2013-09-30 DIAGNOSIS — R1033 Periumbilical pain: Secondary | ICD-10-CM

## 2013-10-07 ENCOUNTER — Ambulatory Visit
Admission: RE | Admit: 2013-10-07 | Discharge: 2013-10-07 | Disposition: A | Discharge status: Home / Self Care | Payer: 59 | Source: Ambulatory Visit | Attending: Gastroenterology | Admitting: Gastroenterology

## 2013-10-07 DIAGNOSIS — R1031 Right lower quadrant pain: Secondary | ICD-10-CM

## 2013-10-07 DIAGNOSIS — R1033 Periumbilical pain: Secondary | ICD-10-CM

## 2013-10-07 MED ORDER — IOHEXOL 300 MG/ML  SOLN
100.0000 mL | Freq: Once | INTRAMUSCULAR | Status: AC | PRN
  Administered 2013-10-07: 100 mL via INTRAVENOUS

## 2013-10-13 ENCOUNTER — Telehealth: Payer: Self-pay | Admitting: Family Medicine

## 2013-10-13 NOTE — Telephone Encounter (Signed)
This was ordered by Dr. Collene Mares, her GI doctor. She needs to contact her

## 2013-10-13 NOTE — Telephone Encounter (Signed)
Pt had cat scan last week and would like someone to call w/ results.

## 2013-10-13 NOTE — Telephone Encounter (Signed)
I spoke with pt and she is going to schedule a follow up to see Dr. [name] to discuss results.

## 2013-10-19 ENCOUNTER — Ambulatory Visit (INDEPENDENT_AMBULATORY_CARE_PROVIDER_SITE_OTHER): Payer: 59 | Admitting: Family Medicine

## 2013-10-19 ENCOUNTER — Encounter: Payer: Self-pay | Admitting: Family Medicine

## 2013-10-19 VITALS — BP 110/80 | HR 92 | Temp 98.5°F | Ht 61.5 in | Wt 148.0 lb

## 2013-10-19 DIAGNOSIS — R1031 Right lower quadrant pain: Secondary | ICD-10-CM

## 2013-10-19 NOTE — Progress Notes (Signed)
   Subjective:    Patient ID: Tracy Brown, female    DOB: 06-01-1961, 53 y.o.   MRN: [id number]  HPI Here to discuss a recent abdominal CT scan ordered by Dr. Collene Mares, who was evaluating her for intermittent RLQ pains. The scan was unremarkable, but Dr. Collene Mares has been out of her office and Tracy Brown was worried about the results. Her BMs are normal, no fever.    Review of Systems  Constitutional: Negative.   Respiratory: Negative.   Cardiovascular: Negative.   Gastrointestinal: Positive for abdominal pain. Negative for nausea, vomiting, diarrhea, constipation, blood in stool, abdominal distention, anal bleeding and rectal pain.       Objective:   Physical Exam  Constitutional: She appears well-developed and well-nourished.  Abdominal: Soft. Bowel sounds are normal. She exhibits no distension and no mass. There is no tenderness. There is no rebound and no guarding.          Assessment & Plan:  I reassured her that the scan was benign. Perhaps her pains are from adhesions from her previous surgeries. She will follow up with Dr. Collene Mares.

## 2013-12-13 ENCOUNTER — Other Ambulatory Visit: Payer: Self-pay | Admitting: Dermatology

## 2014-03-03 ENCOUNTER — Other Ambulatory Visit: Payer: Self-pay

## 2014-03-03 DIAGNOSIS — Z1231 Encounter for screening mammogram for malignant neoplasm of breast: Secondary | ICD-10-CM

## 2014-03-09 ENCOUNTER — Encounter (INDEPENDENT_AMBULATORY_CARE_PROVIDER_SITE_OTHER): Payer: Self-pay

## 2014-03-09 ENCOUNTER — Ambulatory Visit
Admission: RE | Admit: 2014-03-09 | Discharge: 2014-03-09 | Disposition: A | Discharge status: Home / Self Care | Payer: 59 | Source: Ambulatory Visit

## 2014-03-09 DIAGNOSIS — Z1231 Encounter for screening mammogram for malignant neoplasm of breast: Secondary | ICD-10-CM

## 2014-04-04 ENCOUNTER — Other Ambulatory Visit (INDEPENDENT_AMBULATORY_CARE_PROVIDER_SITE_OTHER): Payer: 59

## 2014-04-04 DIAGNOSIS — Z0000 Encounter for general adult medical examination without abnormal findings: Secondary | ICD-10-CM

## 2014-04-04 LAB — CBC WITH DIFFERENTIAL/PLATELET
BASOS PCT: 0.6 % (ref 0.0–3.0)
Basophils Absolute: 0.1 10*3/uL (ref 0.0–0.1)
EOS PCT: 1.7 % (ref 0.0–5.0)
Eosinophils Absolute: 0.1 10*3/uL (ref 0.0–0.7)
HEMATOCRIT: 40 % (ref 36.0–46.0)
Hemoglobin: 13.1 g/dL (ref 12.0–15.0)
Lymphocytes Relative: 35.1 % (ref 12.0–46.0)
Lymphs Abs: 2.8 10*3/uL (ref 0.7–4.0)
MCHC: 32.7 g/dL (ref 30.0–36.0)
MCV: 87.7 fl (ref 78.0–100.0)
MONO ABS: 0.6 10*3/uL (ref 0.1–1.0)
Monocytes Relative: 7 % (ref 3.0–12.0)
NEUTROS PCT: 55.6 % (ref 43.0–77.0)
Neutro Abs: 4.4 10*3/uL (ref 1.4–7.7)
Platelets: 248 10*3/uL (ref 150.0–400.0)
RBC: 4.56 Mil/uL (ref 3.87–5.11)
RDW: 13.2 % (ref 11.5–15.5)
WBC: 8 10*3/uL (ref 4.0–10.5)

## 2014-04-04 LAB — LIPID PANEL
CHOL/HDL RATIO: 4
Cholesterol: 207 mg/dL — ABNORMAL HIGH (ref 0–200)
HDL: 57.2 mg/dL (ref >39.00)
LDL CALC: 129 mg/dL — AB (ref 0–99)
NONHDL: 149.8
Triglycerides: 103 mg/dL (ref 0.0–149.0)
VLDL: 20.6 mg/dL (ref 0.0–40.0)

## 2014-04-04 LAB — HEPATIC FUNCTION PANEL
ALT: 19 U/L (ref 0–35)
AST: 17 U/L (ref 0–37)
Albumin: 4 g/dL (ref 3.5–5.2)
Alkaline Phosphatase: 61 U/L (ref 39–117)
BILIRUBIN DIRECT: 0 mg/dL (ref 0.0–0.3)
TOTAL PROTEIN: 7.2 g/dL (ref 6.0–8.3)
Total Bilirubin: 0.2 mg/dL (ref 0.2–1.2)

## 2014-04-04 LAB — BASIC METABOLIC PANEL
BUN: 14 mg/dL (ref 6–23)
CHLORIDE: 107 meq/L (ref 96–112)
CO2: 25 mEq/L (ref 19–32)
CREATININE: 0.8 mg/dL (ref 0.4–1.2)
Calcium: 9.4 mg/dL (ref 8.4–10.5)
GFR: 84.71 mL/min (ref >60.00)
Glucose, Bld: 83 mg/dL (ref 70–99)
POTASSIUM: 4.7 meq/L (ref 3.5–5.1)
SODIUM: 140 meq/L (ref 135–145)

## 2014-04-04 LAB — POCT URINALYSIS DIPSTICK
Bilirubin, UA: NEGATIVE
Glucose, UA: NEGATIVE
KETONES UA: NEGATIVE
Nitrite, UA: NEGATIVE
PH UA: 5.5
PROTEIN UA: NEGATIVE
RBC UA: NEGATIVE
SPEC GRAV UA: 1.01
Urobilinogen, UA: 0.2

## 2014-04-04 LAB — TSH: TSH: 1.57 u[IU]/mL (ref 0.35–4.50)

## 2014-04-13 ENCOUNTER — Ambulatory Visit (INDEPENDENT_AMBULATORY_CARE_PROVIDER_SITE_OTHER): Payer: 59 | Admitting: Family Medicine

## 2014-04-13 ENCOUNTER — Encounter: Payer: Self-pay | Admitting: Family Medicine

## 2014-04-13 VITALS — BP 113/83 | HR 94 | Temp 98.5°F | Ht 62.0 in | Wt 150.0 lb

## 2014-04-13 DIAGNOSIS — I10 Essential (primary) hypertension: Secondary | ICD-10-CM

## 2014-04-13 DIAGNOSIS — Z87442 Personal history of urinary calculi: Secondary | ICD-10-CM

## 2014-04-13 DIAGNOSIS — Z23 Encounter for immunization: Secondary | ICD-10-CM

## 2014-04-13 DIAGNOSIS — Z0000 Encounter for general adult medical examination without abnormal findings: Secondary | ICD-10-CM

## 2014-04-13 DIAGNOSIS — E785 Hyperlipidemia, unspecified: Secondary | ICD-10-CM

## 2014-04-13 NOTE — Progress Notes (Signed)
   Subjective:    Patient ID: Tracy Brown, female    DOB: Dec 02, 1960, 53 y.o.   MRN: [id number]  HPI 53 yr old female for a cpx. She feels well but has some concerns about her weight. She exercises 3 days a week and watches hier diet but her weight continues to slowly climb.    Review of Systems  Constitutional: Negative.   HENT: Negative.   Eyes: Negative.   Respiratory: Negative.   Cardiovascular: Negative.   Gastrointestinal: Negative.   Genitourinary: Negative for dysuria, urgency, frequency, hematuria, flank pain, decreased urine volume, enuresis, difficulty urinating, pelvic pain and dyspareunia.  Musculoskeletal: Negative.   Skin: Negative.   Neurological: Negative.   Psychiatric/Behavioral: Negative.        Objective:   Physical Exam  Constitutional: She is oriented to person, place, and time. She appears well-developed and well-nourished. No distress.  HENT:  Head: Normocephalic and atraumatic.  Right Ear: External ear normal.  Left Ear: External ear normal.  Nose: Nose normal.  Mouth/Throat: Oropharynx is clear and moist. No oropharyngeal exudate.  Eyes: Conjunctivae and EOM are normal. Pupils are equal, round, and reactive to light. No scleral icterus.  Neck: Normal range of motion. Neck supple. No JVD present. No thyromegaly present.  Cardiovascular: Normal rate, regular rhythm, normal heart sounds and intact distal pulses.  Exam reveals no gallop and no friction rub.   No murmur heard. EKG normal   Pulmonary/Chest: Effort normal and breath sounds normal. No respiratory distress. She has no wheezes. She has no rales. She exhibits no tenderness.  Abdominal: Soft. Bowel sounds are normal. She exhibits no distension and no mass. There is no tenderness. There is no rebound and no guarding.  Musculoskeletal: Normal range of motion. She exhibits no edema and no tenderness.  Lymphadenopathy:    She has no cervical adenopathy.  Neurological: She is alert and oriented  to person, place, and time. She has normal reflexes. No cranial nerve deficit. She exhibits normal muscle tone. Coordination normal.  Skin: Skin is warm and dry. No rash noted. No erythema.  Psychiatric: She has a normal mood and affect. Her behavior is normal. Judgment and thought content normal.          Assessment & Plan:  Well exam. We will refer her to Nutrition to discuss weight, lipids, and HTN. Refer to Urology to address her kidney stones.

## 2014-04-13 NOTE — Progress Notes (Signed)
Pre visit review using our clinic review tool, if applicable. No additional management support is needed unless otherwise documented below in the visit note. 

## 2014-06-03 ENCOUNTER — Ambulatory Visit: Payer: 59 | Admitting: Dietician

## 2014-06-16 ENCOUNTER — Encounter: Payer: Self-pay | Admitting: Dietician

## 2014-06-16 ENCOUNTER — Encounter: Payer: 59 | Attending: Family Medicine | Admitting: Dietician

## 2014-06-16 DIAGNOSIS — E785 Hyperlipidemia, unspecified: Secondary | ICD-10-CM | POA: Diagnosis not present

## 2014-06-16 DIAGNOSIS — Z713 Dietary counseling and surveillance: Secondary | ICD-10-CM | POA: Diagnosis not present

## 2014-06-16 DIAGNOSIS — E663 Overweight: Secondary | ICD-10-CM | POA: Insufficient documentation

## 2014-06-16 NOTE — Progress Notes (Signed)
Medical Nutrition Therapy:  Appt start time: 2563 end time:  8937.   Assessment:  Primary concerns today: Tracy Brown was referred today for weight management, hypertension, and hyperlipidemia. She has gradually gained weight over the past few years with menopause (since age 53).  She states her goals for weight loss are to lose 30 lbs.  She started working at home 1 year ago and reports this lead to decreased activity.  She liked walking in the past but following colon surgery she fell out of habit.  She works 6am-3:30pm Mon-Thurs and Fri 6-10am for [employer] from home at her computer.  She reports not wanting to leave her home some days.  She lives with her boyfriend of 59 years, he does the food shopping and she does the food cooking.  She states they eat out or order in about half of their meals some weeks from places like [employer], fast-food burgers or [employer] chicken, or [employer]   She states she doesn't like vegetables or fruit but will make herself eat some of them.  She does not have a good sense of smell (neither did her mother) and so she cannot taste many flavors.  She states this is one reason why she uses more salt to flavor foods, and chooses more spicy and sour foods. This is also a big reason why she does not like to cook when others are eating with her, she is afraid she will use too much/the wrong salt/seasoning.  Certain foods she does not like the consistency like scrambled eggs or cottage cheese. She states she is ready to make diet changes and knows she needs to start exercising more regularly but is not as ready for this change.  Learning Readiness:  Ready  MEDICATIONS: see list. One A Day MV for 50+, Calcium 600 mg Plus Vit D, Metamucil 1x/day   DIETARY INTAKE:  Usual eating pattern includes 3 meals and 1-2 snacks per day.  24-hr recall:  Wake up: coffee with fat-free 1/2 and 1/2 and stevia B (11 AM):  special k cereal with banana and skim milk, banana, OR  oatmeal with craisins and honey Snk ( AM): none  L (1:30 PM): cheese and crackers and peanut butter crackers, or soup, or carrot and celery and hummus Snk (3:30 PM): dark chocolate or chips or heart healthy nuts or grapes D (6-8 PM): out to eat/ordered in or cook spaghetti or meat/starch: chicken and baked potato or frozen fries and pork chops, or BFD: bacon or sausage with eggs, grits, etc. Snk ( PM): sometimes dark chocolates (2 at a time, will go pack 4-5 times) Beverages: coffee, water, sweet tea occasionally, sodas and alcohol rarely, sometimes simply lemonade  Usual physical activity: none  Estimated energy needs: 1200 calories  Progress Towards Goal(s): just started   Nutritional Diagnosis:  NB-1.1 Food and nutrition-related knowledge deficit As related to no prior formal nutrition education.  As evidenced by patient statement "I need to know the right kinds of foods to eat and how to cook them."    Intervention:  Nutrition education and counseling. Stated appropriate weight loss goals initially is to lose 5-10% of current body weight (8-15 lbs) to see associated health benefits.  Answered patient's questions about certain food choices and diets.  Discussed healthy fats and unhealthy fats.  Discussed importance of protein at meals and snacks.  Recommended 2-3 servings of dairy per day for calcium and encouraged choosing low-fat options.  Patient already reads food labels but  requested information on what to look for.  Demonstrated how to use the Nutrition Facts Label when comparing foods/products.  Talked through barriers she is facing with beginning exercise again.  Used motivational interviewing to enhance change talk.  Together we set the following realistic goals:    Goals: -Eat at least one serving of vegetables per day -Consider options for exercise in the colder months -Read the nutrition facts label for serving size, calories, and fat (aim for <30% of calories from Fat, pay  attention to the TYPES of fats)  Teaching Method Utilized: Visual Auditory  Handouts given during visit include:  Nutrition Facts Label  Hypertension  MyPlate  Heart Healthy Foods  Barriers to learning/adherence to lifestyle change: none  Demonstrated degree of understanding via:  Teach Back   Monitoring/Evaluation:  Dietary intake, exercise, and body weight in 1 month(s). Plan is to discuss further exercise goals, sodium intake, and mindful eating.

## 2014-07-29 ENCOUNTER — Ambulatory Visit: Payer: 59 | Admitting: Dietician

## 2014-09-01 ENCOUNTER — Ambulatory Visit: Payer: 59 | Admitting: Dietician

## 2014-11-25 ENCOUNTER — Ambulatory Visit (INDEPENDENT_AMBULATORY_CARE_PROVIDER_SITE_OTHER): Payer: 59 | Admitting: Family Medicine

## 2014-11-25 ENCOUNTER — Encounter: Payer: Self-pay | Admitting: Family Medicine

## 2014-11-25 VITALS — BP 116/85 | HR 71 | Temp 98.8°F | Ht 62.0 in | Wt 150.0 lb

## 2014-11-25 DIAGNOSIS — N644 Mastodynia: Secondary | ICD-10-CM

## 2014-11-25 MED ORDER — DICLOFENAC SODIUM 75 MG PO TBEC: 75.0000 mg | DELAYED_RELEASE_TABLET | Freq: Two times a day (BID) | ORAL | Status: DC | PRN

## 2014-11-25 NOTE — Progress Notes (Signed)
Pre visit review using our clinic review tool, if applicable. No additional management support is needed unless otherwise documented below in the visit note. 

## 2014-11-25 NOTE — Progress Notes (Signed)
   Subjective:    Patient ID: Tracy Brown, female    DOB: [birth year], 54 y.o.   MRN: [id number]  HPI Here for 2 weeks of intermittent tenderness and mild pain in the right side. No recent trauma or unusual activities. This started in the right lateral breast and right axilla, but it can generalize down the right flank as well. No chest pain or SOB. The pain is not positional. She thought she felt a lump in the lateral right breast at first but not now. She had a normal mammogram last July.    Review of Systems  Constitutional: Negative.   Respiratory: Negative.   Cardiovascular: Negative.   Gastrointestinal: Negative.   Genitourinary: Negative.   Musculoskeletal: Positive for myalgias.       Objective:   Physical Exam  Constitutional: She appears well-developed and well-nourished.  Cardiovascular: Normal rate, regular rhythm, normal heart sounds and intact distal pulses.   Pulmonary/Chest: Effort normal and breath sounds normal. No respiratory distress. She has no wheezes. She has no rales.  Abdominal: Soft. Bowel sounds are normal. She exhibits no distension and no mass. There is no tenderness. There is no rebound and no guarding.  Genitourinary:  The right breast is tender laterally especially along the right anterior axillary line. The right axilla is somewhat tender. No lumps are felt. The right ribs are mildly tender along the lateral line. No swelling   Skin: No rash noted. No erythema.          Assessment & Plan:  Right side pain of uncertain etiology. The quality of this and its widespread locations makes it seem muscular in nature. Use Ibuprofen prn. Set up a right breast mammogram and [name] soon.

## 2014-12-02 ENCOUNTER — Ambulatory Visit
Admission: RE | Admit: 2014-12-02 | Discharge: 2014-12-02 | Disposition: A | Discharge status: Home / Self Care | Payer: 59 | Source: Ambulatory Visit | Attending: Family Medicine | Admitting: Family Medicine

## 2014-12-02 ENCOUNTER — Other Ambulatory Visit: Payer: Self-pay | Admitting: Family Medicine

## 2014-12-02 DIAGNOSIS — N644 Mastodynia: Secondary | ICD-10-CM

## 2015-01-31 ENCOUNTER — Ambulatory Visit (INDEPENDENT_AMBULATORY_CARE_PROVIDER_SITE_OTHER): Payer: 59 | Admitting: Family Medicine

## 2015-01-31 ENCOUNTER — Encounter: Payer: Self-pay | Admitting: Family Medicine

## 2015-01-31 VITALS — BP 108/86 | HR 86 | Temp 98.8°F | Ht 62.0 in | Wt 151.0 lb

## 2015-01-31 DIAGNOSIS — B029 Zoster without complications: Secondary | ICD-10-CM | POA: Diagnosis not present

## 2015-01-31 MED ORDER — TRAMADOL HCL 50 MG PO TABS: 50.0000 mg | ORAL_TABLET | Freq: Four times a day (QID) | ORAL | Status: DC | PRN

## 2015-01-31 MED ORDER — PREDNISONE 10 MG PO TABS: ORAL_TABLET | ORAL | Status: DC

## 2015-01-31 NOTE — Progress Notes (Signed)
Pre visit review using our clinic review tool, if applicable. No additional management support is needed unless otherwise documented below in the visit note. 

## 2015-01-31 NOTE — Progress Notes (Signed)
   Subjective:    Patient ID: Tracy Brown, female    DOB: [birth year], 54 y.o.   MRN: [id number]  HPI Here to follow up on shingles. She developed a rash in the left groin area and over the left buttock several days ago, and she is now having severe pain in these areas. She saw someone at her GYN office yesterday and was given a 7 day course of Valtrex. She is very uncomfortable and cannot sleep.    Review of Systems  Constitutional: Negative.   Skin: Positive for rash.       Objective:   Physical Exam  Constitutional: She appears well-developed and well-nourished.  Cardiovascular: Normal rate, regular rhythm, normal heart sounds and intact distal pulses.   Pulmonary/Chest: Effort normal and breath sounds normal.  Skin:  There is a band of red vesicles from the left groin around the left lateral hip and over the left buttock.           Assessment & Plan:  Shingles. She will continue the Valtrex. Add a prednisone taper and Tramadol for pain.

## 2015-02-08 ENCOUNTER — Telehealth: Payer: Self-pay | Admitting: Family Medicine

## 2015-02-08 MED ORDER — VALACYCLOVIR HCL 1 G PO TABS: 1000.0000 mg | ORAL_TABLET | Freq: Three times a day (TID) | ORAL | Status: DC

## 2015-02-08 NOTE — Telephone Encounter (Signed)
Patient Name: MARKEYA MINCY DOB: 09-21-1960 Initial Comment caller states she has shingles and is out of medication, pain in her leg is back Nurse Assessment Nurse: Ronnald Ramp, RN, Miranda Date/Time (Eastern Time): 02/08/2015 9:44:43 AM Confirm and document reason for call. If symptomatic, describe symptoms. ---Caller states she was seen 10 days ago by GYN and diagnosed with shingles and prescribed Valtrex. Saw PCP 9 days ago and told to take Valtrex and prescribed Prednisone and Ultram. She has completed the Valtrex but it is flaring up again. She still has 3 days of Prednisone. Has the patient traveled out of the country within the last 30 days? ---Not Applicable Does the patient require triage? ---Yes Related visit to physician within the last 2 weeks? ---Yes Does the PT have any chronic conditions? (i.e. diabetes, asthma, etc.) ---Yes List chronic conditions. ---Allergies, Did the patient indicate they were pregnant? ---No Guidelines Guideline Title Affirmed Question Affirmed Notes Shingles [1] Shingles rash already diagnosed and [2] taking antiviral medication Final Disposition User [place], RN, Miranda Comments Pt is calling to see if she needs to be on the Valtrex again. She requests a call back regarding medication.

## 2015-02-08 NOTE — Telephone Encounter (Signed)
Call in Valtrex 1000 mg TID #30

## 2015-02-08 NOTE — Telephone Encounter (Signed)
I spoke with pt and sent script e-scribe. 

## 2015-03-01 ENCOUNTER — Ambulatory Visit (INDEPENDENT_AMBULATORY_CARE_PROVIDER_SITE_OTHER): Payer: 59 | Admitting: Family

## 2015-03-01 ENCOUNTER — Encounter: Payer: Self-pay | Admitting: Family

## 2015-03-01 ENCOUNTER — Telehealth: Payer: Self-pay | Admitting: Family Medicine

## 2015-03-01 VITALS — BP 122/80 | HR 92 | Temp 98.1°F | Wt 153.0 lb

## 2015-03-01 DIAGNOSIS — Z113 Encounter for screening for infections with a predominantly sexual mode of transmission: Secondary | ICD-10-CM

## 2015-03-01 DIAGNOSIS — B354 Tinea corporis: Secondary | ICD-10-CM

## 2015-03-01 MED ORDER — FLUCONAZOLE 150 MG PO TABS
150.0000 mg | ORAL_TABLET | Freq: Once | ORAL | Status: DC
Start: 2015-03-01 — End: 2016-05-01

## 2015-03-01 MED ORDER — CLOTRIMAZOLE-BETAMETHASONE 1-0.05 % EX CREA: 1.0000 "application " | TOPICAL_CREAM | Freq: Two times a day (BID) | CUTANEOUS | Status: DC

## 2015-03-01 NOTE — Telephone Encounter (Signed)
Patient Name: Tracy Brown DOB: 1960-10-22 Initial Comment caller states she thinks shingles is coming back Nurse Assessment Nurse: Marcelline Deist, RN, Kermit Balo Date/Time (Eastern Time): 03/01/2015 1:00:12 PM Confirm and document reason for call. If symptomatic, describe symptoms. ---Caller states she thinks Shingles is coming back, had it in mid June. She was on Valtrex & Prednisone. Had a repeat course of Valtrex for another 10 days, total of 17 days. It is in the crotch area & bottom. The rash had faded & is coming back, starting to itch again. No fever. Has the patient traveled out of the country within the last 30 days? ---Not Applicable Does the patient require triage? ---Yes Related visit to physician within the last 2 weeks? ---No Does the PT have any chronic conditions? (i.e. diabetes, asthma, etc.) ---No Did the patient indicate they were pregnant? ---No Guidelines Guideline Title Affirmed Question Affirmed Notes Shingles [1] Shingles rash (matches SYMPTOMS) AND [2] onset within past 72 hours Final Disposition User See Physician within [place], RN, [employer] Referrals REFERRED TO PCP OFFICE Disagree/Comply: Leta Baptist

## 2015-03-01 NOTE — Patient Instructions (Signed)

## 2015-03-02 ENCOUNTER — Other Ambulatory Visit: Payer: Self-pay | Admitting: Family Medicine

## 2015-03-02 LAB — HSV(HERPES SIMPLEX VRS) I + II AB-IGG
HSV 1 Glycoprotein G Ab, IgG: <0.1 IV
HSV 2 Glycoprotein G Ab, IgG: <0.1 IV

## 2015-03-02 MED ORDER — VALACYCLOVIR HCL 1 G PO TABS
1000.0000 mg | ORAL_TABLET | Freq: Three times a day (TID) | ORAL | Status: DC
Start: 2015-03-02 — End: 2015-03-27

## 2015-03-02 NOTE — Telephone Encounter (Signed)
Per Dr. [name] pt can take both medications.

## 2015-03-02 NOTE — Telephone Encounter (Signed)
Rx sent to pharmacy   

## 2015-03-02 NOTE — Telephone Encounter (Signed)
I spoke with pt and went over below information. 

## 2015-03-02 NOTE — Telephone Encounter (Signed)
Pt called and Spoke to [place], but no one called in her Valtrex rx. Please Advise

## 2015-03-02 NOTE — Telephone Encounter (Signed)
Patient was seen yesterday and was given a prescription of diflucan 150 mg with the diagnosis of yeast.  She believes it is shingles because it looks and feels the same as the first time.  Should she take both diflucan and valtrex?

## 2015-03-02 NOTE — Telephone Encounter (Signed)
Please advise 

## 2015-03-02 NOTE — Telephone Encounter (Signed)
Call in Valtrex 1000 mg tid for 10 days, I would not repeat the prednisone this time

## 2015-03-03 NOTE — Progress Notes (Signed)
Subjective:    Patient ID: Tracy Brown, female    DOB: 1960-08-14, 54 y.o.   MRN: [id number]  HPI 54 year old caucasian female in today with complaints of re-occuring shingles rash.  Patient states that she was diagnosed with shingles after she had gone to the beach and developed a rash and pain under her left eye, which radiated down her left side and buttocks, into her vaginal area with burning and pain.  States that she went to see her gynecologist for treatment and was diagnosed with shingles, in which she was given a Rx for Prednisone, Valtrex and Ultram.  Patient then followed up here with PCP for re-occurrence of signs and symptoms and was given another Rx for Valtrex.  Patient here today for follow-up because she continues to have a irritated rash to the buttocks and vaginal area.    Review of Systems  Constitutional: Negative.   Respiratory: Negative.   Cardiovascular: Negative.   Genitourinary: Negative for dysuria, vaginal discharge and vaginal pain.       Vaginal irritation.   Musculoskeletal: Negative.   Skin: Positive for rash.       Buttocks   Allergic/Immunologic: Negative.   Neurological: Negative.   Psychiatric/Behavioral: Negative.   All other systems reviewed and are negative.  Past Medical History  Diagnosis Date  . Chickenpox   . Allergy   . Anemia   . Colon polyps   . Hypertension   . Nephrolithiasis   . OA (osteoarthritis)   . History of recurrent UTIs   . Heart murmur   . Hyperlipidemia     History   Social History  . Marital Status: Divorced    Spouse Name: N/A  . Number of Children: N/A  . Years of Education: N/A   Occupational History  . Not on file.   Social History Main Topics  . Smoking status: Former Research scientist (life sciences)  . Smokeless tobacco: Never Used  . Alcohol Use: 0.0 oz/week    0 Standard drinks or equivalent per week     Comment: once a month  . Drug Use: No  . Sexual Activity: Not on file   Other Topics Concern  . Not on file    Social History Narrative    Past Surgical History  Procedure Laterality Date  . Hemorrhoid surgery    . Tonsillectomy    . Colonoscopy  02-26-12    per Dr. Collene Mares, hyperplastic polyps, repeat in 3 years   . Hernia repair    . Colon surgery  August 2012    right colectomy, per Dr. Judeth Horn, for tubular  adenoma   . 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 Cancer Other     breast,colon,ovarian  . Coronary artery disease Other   . Hypertension Other   . Stroke Other   . Leukemia Father     Allergies  Allergen Reactions  . Hydrocodone   . Zithromax [Azithromycin Dihydrate]     Current Outpatient Prescriptions on File Prior to Visit  Medication Sig Dispense Refill  . aspirin 81 MG tablet Take 81 mg by mouth daily.    . calcium carbonate (OS-CAL) 600 MG TABS Take 600 mg by mouth daily.     . cetirizine (ZYRTEC) 10 MG tablet Take 10 mg by mouth daily.    Marland Kitchen ibuprofen (ADVIL,MOTRIN) 200 MG tablet Take 200 mg by mouth  every 6 (six) hours as needed.    . Multiple Vitamins-Minerals (MULTIVITAMIN,TX-MINERALS) tablet Take 1 tablet by mouth daily.      . Nicotine (NICODERM CQ TD) Place onto the skin.      Marland Kitchen psyllium (METAMUCIL) 58.6 % powder Take 1 packet by mouth daily.      . diclofenac (VOLTAREN) 75 MG EC tablet Take 1 tablet (75 mg total) by mouth 2 (two) times daily as needed for moderate pain. (Patient not taking: Reported on 01/31/2015) 60 tablet 2  . traMADol (ULTRAM) 50 MG tablet Take 1 tablet (50 mg total) by mouth every 6 (six) hours as needed for moderate pain. (Patient not taking: Reported on 03/01/2015) 60 tablet 0   No current facility-administered medications on file prior to visit.    BP 122/80 mmHg  Pulse 92  Temp(Src) 98.1 F (36.7 C)  Wt 153 lb (69.4 kg)chart     Objective:   Physical Exam  Constitutional: She is oriented to person, place, and time. She appears  well-developed and well-nourished.  Neck: Normal range of motion. Neck supple.  Cardiovascular: Normal rate, regular rhythm, normal heart sounds and intact distal pulses.   Pulmonary/Chest: Effort normal and breath sounds normal.  Abdominal: Soft. Bowel sounds are normal.  Neurological: She is alert and oriented to person, place, and time.  Skin: Rash noted. There is erythema.  Well defined, hyperpigmented rash to the medial buttocks extending to the labia majora.   Psychiatric: She has a normal mood and affect. Her behavior is normal. Judgment and thought content normal.          Assessment & Plan:  Tracy Brown was seen today for shingles.  Diagnoses and all orders for this visit:  Tinea corporis  Screen for sexually transmitted diseases Orders: -     HSV(herpes simplex vrs) 1+2 ab-IgG  Other orders -     fluconazole (DIFLUCAN) 150 MG tablet; Take 1 tablet (150 mg total) by mouth once. -     clotrimazole-betamethasone (LOTRISONE) cream; Apply 1 application topically 2 (two) times daily.   Vaginal swab taken to assess for Herpes.  Willl call with results.  Education given for Vulvovaginal Candidiasis   Rx for Diflucan 150mg  X 1 tablet

## 2015-03-21 ENCOUNTER — Other Ambulatory Visit: Payer: Self-pay

## 2015-03-21 DIAGNOSIS — Z1231 Encounter for screening mammogram for malignant neoplasm of breast: Secondary | ICD-10-CM

## 2015-03-27 ENCOUNTER — Ambulatory Visit (INDEPENDENT_AMBULATORY_CARE_PROVIDER_SITE_OTHER): Payer: 59 | Admitting: Family Medicine

## 2015-03-27 ENCOUNTER — Encounter: Payer: Self-pay | Admitting: Family Medicine

## 2015-03-27 VITALS — BP 104/82 | HR 76 | Temp 98.4°F | Ht 62.0 in | Wt 151.0 lb

## 2015-03-27 DIAGNOSIS — B029 Zoster without complications: Secondary | ICD-10-CM | POA: Diagnosis not present

## 2015-03-27 MED ORDER — VALACYCLOVIR HCL 1 G PO TABS: 1000.0000 mg | ORAL_TABLET | Freq: Every day | ORAL | Status: DC

## 2015-03-27 MED ORDER — VALACYCLOVIR HCL 1 G PO TABS: 1000.0000 mg | ORAL_TABLET | Freq: Three times a day (TID) | ORAL | Status: DC

## 2015-03-27 NOTE — Progress Notes (Signed)
Pre visit review using our clinic review tool, if applicable. No additional management support is needed unless otherwise documented below in the visit note. 

## 2015-03-28 ENCOUNTER — Encounter: Payer: Self-pay | Admitting: Family Medicine

## 2015-03-28 NOTE — Progress Notes (Signed)
   Subjective:    Patient ID: Tracy Brown, female    DOB: Mar 01, 1961, 54 y.o.   MRN: [id number]  HPI Here to follow up apparent shingles involving the perineum and left leg. In mid June she developed pain in the perineum that radiated down the left leg as well as a rash in the groin and perineal area that was diagnosed as shingles. She was given a course of prednisone and Valtrex and she improved. This then recurred and she was given another round of Valtrex. Again she improved for awhile but a mild intermittent pain still persisted. She has also had a mild rash in the perineal area. She was seen here recently and was felt to have a fungal infection in this area and she was given Diflucan and Lotrisone. This has not helped at all.    Review of Systems  Constitutional: Negative.   Respiratory: Negative.   Cardiovascular: Negative.   Genitourinary: Negative.   Skin: Positive for rash.       Objective:   Physical Exam  Constitutional: She is oriented to person, place, and time. She appears well-developed and well-nourished.  Cardiovascular: Normal rate, regular rhythm, normal heart sounds and intact distal pulses.   Pulmonary/Chest: Effort normal and breath sounds normal.  Neurological: She is alert and oriented to person, place, and time.  Skin:  Skin is clear on the legs but there is a faint erythema in the perineal area. No raised lesions.           Assessment & Plan:  The fact that she responds to Valtrex makes me think this has truly been a herpetic infection. We will give her another 10 days of Valtrex 1000 mg TID, but after that she will stay on a maintenance dose of 1000 mg once daily for the next 3-6 months. Recheck prn

## 2015-04-12 ENCOUNTER — Other Ambulatory Visit (INDEPENDENT_AMBULATORY_CARE_PROVIDER_SITE_OTHER): Payer: 59

## 2015-04-12 DIAGNOSIS — Z0000 Encounter for general adult medical examination without abnormal findings: Secondary | ICD-10-CM | POA: Diagnosis not present

## 2015-04-12 LAB — TSH: TSH: 3.16 u[IU]/mL (ref 0.35–4.50)

## 2015-04-12 LAB — POCT URINALYSIS DIPSTICK
BILIRUBIN UA: NEGATIVE
Blood, UA: NEGATIVE
Glucose, UA: NEGATIVE
Ketones, UA: NEGATIVE
LEUKOCYTES UA: NEGATIVE
NITRITE UA: NEGATIVE
PH UA: 7
PROTEIN UA: NEGATIVE
Spec Grav, UA: 1.02
Urobilinogen, UA: 0.2

## 2015-04-12 LAB — CBC WITH DIFFERENTIAL/PLATELET
Basophils Absolute: 0 10*3/uL (ref 0.0–0.1)
Basophils Relative: 0.6 % (ref 0.0–3.0)
EOS ABS: 0.1 10*3/uL (ref 0.0–0.7)
Eosinophils Relative: 1.9 % (ref 0.0–5.0)
HCT: 39.7 % (ref 36.0–46.0)
HEMOGLOBIN: 13.2 g/dL (ref 12.0–15.0)
LYMPHS ABS: 2.9 10*3/uL (ref 0.7–4.0)
Lymphocytes Relative: 37.2 % (ref 12.0–46.0)
MCHC: 33.2 g/dL (ref 30.0–36.0)
MCV: 88.2 fl (ref 78.0–100.0)
MONO ABS: 0.6 10*3/uL (ref 0.1–1.0)
Monocytes Relative: 7.2 % (ref 3.0–12.0)
NEUTROS PCT: 53.1 % (ref 43.0–77.0)
Neutro Abs: 4.1 10*3/uL (ref 1.4–7.7)
Platelets: 262 10*3/uL (ref 150.0–400.0)
RBC: 4.5 Mil/uL (ref 3.87–5.11)
RDW: 15.6 % — ABNORMAL HIGH (ref 11.5–15.5)
WBC: 7.7 10*3/uL (ref 4.0–10.5)

## 2015-04-12 LAB — HEPATIC FUNCTION PANEL
ALT: 18 U/L (ref 0–35)
AST: 17 U/L (ref 0–37)
Albumin: 4.4 g/dL (ref 3.5–5.2)
Alkaline Phosphatase: 64 U/L (ref 39–117)
BILIRUBIN DIRECT: 0 mg/dL (ref 0.0–0.3)
BILIRUBIN TOTAL: 0.3 mg/dL (ref 0.2–1.2)
Total Protein: 7.2 g/dL (ref 6.0–8.3)

## 2015-04-12 LAB — LIPID PANEL
Cholesterol: 221 mg/dL — ABNORMAL HIGH (ref 0–200)
HDL: 58.7 mg/dL (ref >39.00)
LDL Cholesterol: 140 mg/dL — ABNORMAL HIGH (ref 0–99)
NONHDL: 162.01
TRIGLYCERIDES: 108 mg/dL (ref 0.0–149.0)
Total CHOL/HDL Ratio: 4
VLDL: 21.6 mg/dL (ref 0.0–40.0)

## 2015-04-12 LAB — BASIC METABOLIC PANEL
BUN: 14 mg/dL (ref 6–23)
CALCIUM: 9.7 mg/dL (ref 8.4–10.5)
CO2: 28 mEq/L (ref 19–32)
CREATININE: 0.8 mg/dL (ref 0.40–1.20)
Chloride: 107 mEq/L (ref 96–112)
GFR: 79.53 mL/min (ref >60.00)
Glucose, Bld: 89 mg/dL (ref 70–99)
Potassium: 4.7 mEq/L (ref 3.5–5.1)
Sodium: 141 mEq/L (ref 135–145)

## 2015-04-14 ENCOUNTER — Other Ambulatory Visit: Payer: 59

## 2015-04-21 ENCOUNTER — Encounter: Payer: Self-pay | Admitting: Family Medicine

## 2015-04-21 ENCOUNTER — Ambulatory Visit (INDEPENDENT_AMBULATORY_CARE_PROVIDER_SITE_OTHER): Payer: 59 | Admitting: Family Medicine

## 2015-04-21 VITALS — BP 106/79 | HR 70 | Temp 98.2°F | Ht 62.0 in | Wt 156.0 lb

## 2015-04-21 DIAGNOSIS — Z0000 Encounter for general adult medical examination without abnormal findings: Secondary | ICD-10-CM | POA: Diagnosis not present

## 2015-04-21 DIAGNOSIS — B029 Zoster without complications: Secondary | ICD-10-CM | POA: Diagnosis not present

## 2015-04-21 NOTE — Progress Notes (Signed)
   Subjective:    Patient ID: Tracy Brown, female    DOB: [birth year], 54 y.o.   MRN: [id number]  HPI 54 yr old female for a well exam. She feels well in general. She is frustrated by how hard it is for her to lose weight.   Review of Systems  Constitutional: Negative.   HENT: Negative.   Eyes: Negative.   Respiratory: Negative.   Cardiovascular: Negative.   Gastrointestinal: Negative.   Genitourinary: Negative for dysuria, urgency, frequency, hematuria, flank pain, decreased urine volume, enuresis, difficulty urinating, pelvic pain and dyspareunia.  Musculoskeletal: Negative.   Skin: Negative.   Neurological: Negative.   Psychiatric/Behavioral: Negative.        Objective:   Physical Exam  Constitutional: She is oriented to person, place, and time. She appears well-developed and well-nourished. No distress.  HENT:  Head: Normocephalic and atraumatic.  Right Ear: External ear normal.  Left Ear: External ear normal.  Nose: Nose normal.  Mouth/Throat: Oropharynx is clear and moist. No oropharyngeal exudate.  Eyes: Conjunctivae and EOM are normal. Pupils are equal, round, and reactive to light. No scleral icterus.  Neck: Normal range of motion. Neck supple. No JVD present. No thyromegaly present.  Cardiovascular: Normal rate, regular rhythm, normal heart sounds and intact distal pulses.  Exam reveals no gallop and no friction rub.   No murmur heard. EKG normal   Pulmonary/Chest: Effort normal and breath sounds normal. No respiratory distress. She has no wheezes. She has no rales. She exhibits no tenderness.  Abdominal: Soft. Bowel sounds are normal. She exhibits no distension and no mass. There is no tenderness. There is no rebound and no guarding.  Musculoskeletal: Normal range of motion. She exhibits no edema or tenderness.  Lymphadenopathy:    She has no cervical adenopathy.  Neurological: She is alert and oriented to person, place, and time. She has normal reflexes. No  cranial nerve deficit. She exhibits normal muscle tone. Coordination normal.  Skin: Skin is warm and dry. No rash noted. No erythema.  Psychiatric: She has a normal mood and affect. Her behavior is normal. Judgment and thought content normal.          Assessment & Plan:  Well exam. We discussed diet and exercise advice. I suggested she look in to a program like Weight Watchers.

## 2015-04-21 NOTE — Progress Notes (Signed)
Pre visit review using our clinic review tool, if applicable. No additional management support is needed unless otherwise documented below in the visit note. 

## 2015-04-27 ENCOUNTER — Telehealth: Payer: Self-pay | Admitting: Family Medicine

## 2015-04-27 NOTE — Telephone Encounter (Signed)
Pt states the form dr fry filled out for her after her cpe visit 9/9 is missing some answers. They were left blank.  It was her A1C and the LDL.  Pt does have a copy ,  Advised pt we could let her know and she could fill in, but she says she has no way to fax. Would like you to resend w/ the blanks filled in.

## 2015-04-28 NOTE — Telephone Encounter (Signed)
Pt will bring a copy os her form on Monday .

## 2015-04-28 NOTE — Telephone Encounter (Signed)
Pt will have to bring by another form, once we were done it would have gone to the scanning department. I do not have a copy of this.

## 2015-05-01 ENCOUNTER — Ambulatory Visit
Admission: RE | Admit: 2015-05-01 | Discharge: 2015-05-01 | Disposition: A | Discharge status: Home / Self Care | Payer: 59 | Source: Ambulatory Visit

## 2015-05-01 DIAGNOSIS — Z1231 Encounter for screening mammogram for malignant neoplasm of breast: Secondary | ICD-10-CM

## 2016-04-22 ENCOUNTER — Other Ambulatory Visit (INDEPENDENT_AMBULATORY_CARE_PROVIDER_SITE_OTHER): Payer: 59

## 2016-04-22 DIAGNOSIS — Z0000 Encounter for general adult medical examination without abnormal findings: Secondary | ICD-10-CM

## 2016-04-22 LAB — POC URINALSYSI DIPSTICK (AUTOMATED)
Bilirubin, UA: NEGATIVE
Blood, UA: NEGATIVE
Glucose, UA: NEGATIVE
Ketones, UA: NEGATIVE
LEUKOCYTES UA: NEGATIVE
Nitrite, UA: NEGATIVE
PH UA: 6.5
PROTEIN UA: NEGATIVE
UROBILINOGEN UA: 0.2

## 2016-04-22 LAB — CBC WITH DIFFERENTIAL/PLATELET
BASOS PCT: 0.4 % (ref 0.0–3.0)
Basophils Absolute: 0 10*3/uL (ref 0.0–0.1)
EOS PCT: 1.8 % (ref 0.0–5.0)
Eosinophils Absolute: 0.2 10*3/uL (ref 0.0–0.7)
HEMATOCRIT: 39.7 % (ref 36.0–46.0)
HEMOGLOBIN: 13.4 g/dL (ref 12.0–15.0)
LYMPHS PCT: 28.4 % (ref 12.0–46.0)
Lymphs Abs: 2.8 10*3/uL (ref 0.7–4.0)
MCHC: 33.9 g/dL (ref 30.0–36.0)
MCV: 86.3 fl (ref 78.0–100.0)
Monocytes Absolute: 0.6 10*3/uL (ref 0.1–1.0)
Monocytes Relative: 6.6 % (ref 3.0–12.0)
Neutro Abs: 6.2 10*3/uL (ref 1.4–7.7)
Neutrophils Relative %: 62.8 % (ref 43.0–77.0)
Platelets: 269 10*3/uL (ref 150.0–400.0)
RBC: 4.6 Mil/uL (ref 3.87–5.11)
RDW: 13.1 % (ref 11.5–15.5)
WBC: 9.9 10*3/uL (ref 4.0–10.5)

## 2016-04-22 LAB — BASIC METABOLIC PANEL
BUN: 15 mg/dL (ref 6–23)
CHLORIDE: 106 meq/L (ref 96–112)
CO2: 27 mEq/L (ref 19–32)
Calcium: 9.5 mg/dL (ref 8.4–10.5)
Creatinine, Ser: 0.77 mg/dL (ref 0.40–1.20)
GFR: 82.8 mL/min (ref >60.00)
Glucose, Bld: 90 mg/dL (ref 70–99)
POTASSIUM: 3.8 meq/L (ref 3.5–5.1)
Sodium: 139 mEq/L (ref 135–145)

## 2016-04-22 LAB — HEPATIC FUNCTION PANEL
ALT: 15 U/L (ref 0–35)
AST: 15 U/L (ref 0–37)
Albumin: 4.6 g/dL (ref 3.5–5.2)
Alkaline Phosphatase: 63 U/L (ref 39–117)
Bilirubin, Direct: 0 mg/dL (ref 0.0–0.3)
TOTAL PROTEIN: 7.5 g/dL (ref 6.0–8.3)
Total Bilirubin: 0.3 mg/dL (ref 0.2–1.2)

## 2016-04-22 LAB — LIPID PANEL
CHOL/HDL RATIO: 4
CHOLESTEROL: 218 mg/dL — AB (ref 0–200)
HDL: 57.5 mg/dL (ref >39.00)
LDL Cholesterol: 139 mg/dL — ABNORMAL HIGH (ref 0–99)
NonHDL: 160.61
TRIGLYCERIDES: 107 mg/dL (ref 0.0–149.0)
VLDL: 21.4 mg/dL (ref 0.0–40.0)

## 2016-04-22 LAB — TSH: TSH: 4.05 u[IU]/mL (ref 0.35–4.50)

## 2016-05-01 ENCOUNTER — Encounter: Payer: Self-pay | Admitting: Family Medicine

## 2016-05-01 ENCOUNTER — Ambulatory Visit (INDEPENDENT_AMBULATORY_CARE_PROVIDER_SITE_OTHER): Payer: 59 | Admitting: Family Medicine

## 2016-05-01 VITALS — BP 101/78 | HR 76 | Temp 98.2°F | Ht 62.0 in | Wt 153.0 lb

## 2016-05-01 DIAGNOSIS — Z0000 Encounter for general adult medical examination without abnormal findings: Secondary | ICD-10-CM | POA: Diagnosis not present

## 2016-05-01 DIAGNOSIS — Z23 Encounter for immunization: Secondary | ICD-10-CM | POA: Diagnosis not present

## 2016-05-01 NOTE — Progress Notes (Signed)
   Subjective:    Patient ID: Tracy Brown, female    DOB: 12/22/1960, 55 y.o.   MRN: [id number]  HPI 55 yr old female for a well exam. She is doing well.    Review of Systems  Constitutional: Negative.   HENT: Negative.   Eyes: Negative.   Respiratory: Negative.   Cardiovascular: Negative.   Gastrointestinal: Negative.   Genitourinary: Negative for decreased urine volume, difficulty urinating, dyspareunia, dysuria, enuresis, flank pain, frequency, hematuria, pelvic pain and urgency.  Musculoskeletal: Negative.   Skin: Negative.   Neurological: Negative.   Psychiatric/Behavioral: Negative.        Objective:   Physical Exam  Constitutional: She is oriented to person, place, and time. She appears well-developed and well-nourished. No distress.  HENT:  Head: Normocephalic and atraumatic.  Right Ear: External ear normal.  Left Ear: External ear normal.  Nose: Nose normal.  Mouth/Throat: Oropharynx is clear and moist. No oropharyngeal exudate.  Eyes: Conjunctivae and EOM are normal. Pupils are equal, round, and reactive to light. No scleral icterus.  Neck: Normal range of motion. Neck supple. No JVD present. No thyromegaly present.  Cardiovascular: Normal rate, regular rhythm, normal heart sounds and intact distal pulses.  Exam reveals no gallop and no friction rub.   No murmur heard. Pulmonary/Chest: Effort normal and breath sounds normal. No respiratory distress. She has no wheezes. She has no rales. She exhibits no tenderness.  Abdominal: Soft. Bowel sounds are normal. She exhibits no distension and no mass. There is no tenderness. There is no rebound and no guarding.  Musculoskeletal: Normal range of motion. She exhibits no edema or tenderness.  Lymphadenopathy:    She has no cervical adenopathy.  Neurological: She is alert and oriented to person, place, and time. She has normal reflexes. No cranial nerve deficit. She exhibits normal muscle tone. Coordination normal.  Skin:  Skin is warm and dry. No rash noted. No erythema.  Psychiatric: She has a normal mood and affect. Her behavior is normal. Judgment and thought content normal.          Assessment & Plan:  Well exam. We discussed diet and exercise.  Laurey Morale, MD

## 2016-05-01 NOTE — Progress Notes (Signed)
Pre visit review using our clinic review tool, if applicable. No additional management support is needed unless otherwise documented below in the visit note. 

## 2016-05-08 ENCOUNTER — Other Ambulatory Visit: Payer: Self-pay | Admitting: Family Medicine

## 2016-05-08 DIAGNOSIS — Z1231 Encounter for screening mammogram for malignant neoplasm of breast: Secondary | ICD-10-CM

## 2016-05-14 ENCOUNTER — Ambulatory Visit: Payer: 59

## 2016-05-20 ENCOUNTER — Ambulatory Visit: Payer: 59

## 2016-05-21 ENCOUNTER — Ambulatory Visit: Payer: 59

## 2016-05-27 ENCOUNTER — Ambulatory Visit
Admission: RE | Admit: 2016-05-27 | Discharge: 2016-05-27 | Disposition: A | Discharge status: Home / Self Care | Payer: 59 | Source: Ambulatory Visit | Attending: Family Medicine | Admitting: Family Medicine

## 2016-05-27 DIAGNOSIS — Z1231 Encounter for screening mammogram for malignant neoplasm of breast: Secondary | ICD-10-CM

## 2016-08-30 ENCOUNTER — Ambulatory Visit (INDEPENDENT_AMBULATORY_CARE_PROVIDER_SITE_OTHER): Payer: 59 | Admitting: Family Medicine

## 2016-08-30 ENCOUNTER — Encounter: Payer: Self-pay | Admitting: Family Medicine

## 2016-08-30 VITALS — BP 110/78 | HR 94 | Temp 98.2°F | Wt 156.4 lb

## 2016-08-30 DIAGNOSIS — Z87891 Personal history of nicotine dependence: Secondary | ICD-10-CM | POA: Diagnosis not present

## 2016-08-30 DIAGNOSIS — R1013 Epigastric pain: Secondary | ICD-10-CM

## 2016-08-30 DIAGNOSIS — R1033 Periumbilical pain: Secondary | ICD-10-CM | POA: Diagnosis not present

## 2016-08-30 MED ORDER — RANITIDINE HCL 150 MG PO TABS: 150.0000 mg | ORAL_TABLET | Freq: Two times a day (BID) | ORAL | 1 refills | Status: DC

## 2016-08-30 NOTE — Assessment & Plan Note (Signed)
Using a decision aid, a thorough discussion of both benefits and harms of LDCT for lung cancer screening was conducted with the patient.  Potential for overdiagnosis, false positive rate, total radiation exposure, and follow-up diagnostic testing were all discussed.  The importance of of adherence to annual lung cancer LDCT screening, impact of comorbidities, and ability or willingness to undergo diagnosis and treatment were also discussed with the patient.  Tracy Brown expressed an understanding of this discussion and elected to proceed with screening LDCT.

## 2016-08-30 NOTE — Progress Notes (Signed)
Pre visit review using our clinic review tool, if applicable. No additional management support is needed unless otherwise documented below in the visit note. 

## 2016-08-30 NOTE — Progress Notes (Signed)
Tracy Brown is a 56 y.o. female here for a new problem, and an exacerbation of a chronic problem.  History of Present Illness:   1. Epigastric Abdominal Pain: New. Intermittent. Radiates to chest. No chest pain, SOB, cough, wheeze, sour taste, palpitations, weight loss, night sweats, N/V/D/C, edema, rash.   2. Periumbilical Abdominal Pain: Chronic, worsening. Hx of right colectomy for mass. Suspected adhesions and umbilical hernia. CT abdomen 2015 without findings. Weight gain of 50 pounds since original surgery, 5 years. Daily, soft to loose, bowel movement. No N/V. Pain is "burning." Worse with palpation. Constant.   3. 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   PMHx, SurgHx, SocialHx, Medications, and Allergies were reviewed in the Visit Navigator and updated as appropriate.  Current Medications:   Current Outpatient Prescriptions:  .  aspirin 81 MG tablet, Take 81 mg by mouth daily., Disp: , Rfl:  .  calcium carbonate (OS-CAL) 600 MG TABS, Take 600 mg by mouth daily. , Disp: , Rfl:  .  cetirizine (ZYRTEC) 10 MG tablet, Take 10 mg by mouth daily., Disp: , Rfl:  .  ibuprofen (ADVIL,MOTRIN) 200 MG tablet, Take 200 mg by mouth every 6 (six) hours as needed., Disp: , Rfl:  .  Multiple Vitamins-Minerals (MULTIVITAMIN,TX-MINERALS) tablet, Take 1 tablet by mouth daily.  , Disp: , Rfl:  .  Nicotine (NICODERM CQ TD), Place onto the skin.  , Disp: , Rfl:  .  psyllium (METAMUCIL) 58.6 % powder, Take 1 packet by mouth daily.  , Disp: , Rfl:  .  ranitidine (ZANTAC) 150 MG tablet, Take 1 tablet (150 mg total) by mouth 2 (two) times daily., Disp: 60 tablet, Rfl: 1    Review of Systems:   Constitutional: Negative for fever, chills and unexpected weight change.  HEENT: Negative for ear discharge, ear pain, mouth sores, sore throat, tinnitus and trouble swallowing.  No eye pain. LUNGS: Negative for cough, choking, chest tightness, shortness of breath and  wheezing.   CV: Negative for chest pain, palpitations and leg swelling.  GI: Negative for nausea, constipation and abdominal distention. No new change in bowel habits. GU: Negative for dysuria, flank pain and difficulty urinating.  MSK:: Negative for unusual  joint swelling or pains, gait problem, neck pain and neck stiffness.  NEURO: Negative for dizziness, tremors, speech difficulty, weakness and headaches. No visual changes. HEME:: Does not bruise/bleed easily.  PSYCH:: Negative for suicidal ideas, hallucinations, sleep disturbance, self-injury, dysphoric mood, decreased concentration and agitation. The patient denies nervous/anxious.     Vitals:   Vitals:   08/30/16 1456  BP: 110/78  Pulse: 94  Temp: 98.2 F (36.8 C)  TempSrc: Oral  SpO2: 97%  Weight: 156 lb 6.4 oz (70.9 kg)     Body mass index is 28.61 kg/m.  Physical Exam:   General: Alert, cooperative, appears stated age and no distress.  HEENT:  Normocephalic, without obvious abnormality, atraumatic. Conjunctivae/corneas clear. PERRL, EOM's intact. Normal TM's and external ear canals both ears. Nares normal. Septum midline. Mucosa normal. No drainage or sinus tenderness. Lips, mucosa, and tongue normal; teeth and gums normal.  Lungs: Clear to auscultation bilaterally.  Heart:: Regular rate and rhythm, S1, S2 normal, no murmur, click, rub or gallop.  Abdomen: Soft, bowel sounds normal; ttp periumbilical and epigastric regions without rebound or guarding, without masses  Extremities: Extremities normal, atraumatic, no cyanosis or edema.  Pulses: 2+ and symmetric.  Skin: Skin color, texture, turgor normal. No  rashes or lesions.  Neurologic: Alert and oriented X 3, normal strength and tone. Normal symmetric. reflexes. Normal coordination and gait.  Psych: Alert,oriented, in NAD with a full range of affect, normal behavior and no psychotic features    Sinus rhythm, normal axes, normal intervals, no atrial or ventricular  hypertrophy, no ST or T-wave changes concerning for ischemia, no pathological Q-waves.  Assessment and Plan:    Problem List Items Addressed This Visit      Unprioritized   Former smoker    Using a decision aid, a thorough discussion of both benefits and harms of LDCT for lung cancer screening was conducted with the patient.  Potential for overdiagnosis, false positive rate, total radiation exposure, and follow-up diagnostic testing were all discussed.  The importance of of adherence to annual lung cancer LDCT screening, impact of comorbidities, and ability or willingness to undergo diagnosis and treatment were also discussed with the patient.  Amneet K Lecrone expressed an understanding of this discussion and elected to proceed with screening LDCT.        Relevant Orders   CT CHEST LUNG CANCER SCREENING LOW DOSE WO CONTRAST    Other Visit Diagnoses    Epigastric pain    -  Primary   c/w dyspepsia. Trial of Zantac.   Relevant Medications   ranitidine (ZANTAC) 150 MG tablet   Other Relevant Orders   EKG 12-Lead (Completed)   Periumbilical abdominal pain       KUB today. Continue Metamucil. Patient will f/u with General Surgery to discuss possible periumbilcal hernia if pain continues.    Relevant Orders   DG Abd 1 View      . Reviewed expectations re: course of current medical issues. . Discussed self-management of symptoms. . Outlined signs and symptoms indicating need for more acute intervention. . Patient verbalized understanding and all questions were answered. . See orders for this visit as documented in the electronic medical record. . Patient received an After-Visit Summary.   Briscoe Deutscher, D.O.

## 2016-09-03 ENCOUNTER — Ambulatory Visit (INDEPENDENT_AMBULATORY_CARE_PROVIDER_SITE_OTHER)
Admission: RE | Admit: 2016-09-03 | Discharge: 2016-09-03 | Disposition: A | Discharge status: Home / Self Care | Payer: 59 | Source: Ambulatory Visit | Attending: Family Medicine | Admitting: Family Medicine

## 2016-09-03 DIAGNOSIS — R1033 Periumbilical pain: Secondary | ICD-10-CM

## 2016-09-24 ENCOUNTER — Other Ambulatory Visit: Payer: Self-pay | Admitting: Acute Care

## 2016-09-24 ENCOUNTER — Telehealth: Payer: Self-pay | Admitting: Family Medicine

## 2016-09-24 DIAGNOSIS — Z87891 Personal history of nicotine dependence: Secondary | ICD-10-CM

## 2016-09-24 NOTE — Telephone Encounter (Signed)
Can you please change referral

## 2016-09-24 NOTE — Telephone Encounter (Signed)
Pulmonary called to advise that referral to them was entered incorrectly. They are asking that you re-enter as an ambulatory referral for lung cancer screening. They are currently unable to link the referral to the appt. Please give Langley Gauss a call at [phone]

## 2016-09-25 NOTE — Telephone Encounter (Signed)
Did this fix the referral?

## 2016-09-25 NOTE — Telephone Encounter (Signed)
Referral completed. Amb ref for Lung Cancer Screening for former smoker. Let me know if issues.

## 2016-10-02 ENCOUNTER — Other Ambulatory Visit: Payer: Self-pay | Admitting: Gastroenterology

## 2016-10-02 DIAGNOSIS — R1013 Epigastric pain: Secondary | ICD-10-CM

## 2016-10-03 ENCOUNTER — Telehealth: Payer: Self-pay | Admitting: Acute Care

## 2016-10-03 NOTE — Telephone Encounter (Signed)
Spoke with pt and rescheduled for [facility] 10/11/16 3:30 CT will be rescheduled  Nothing further needed

## 2016-10-04 ENCOUNTER — Inpatient Hospital Stay: Admission: RE | Admit: 2016-10-04 | Payer: 59 | Source: Ambulatory Visit

## 2016-10-04 ENCOUNTER — Ambulatory Visit
Admission: RE | Admit: 2016-10-04 | Discharge: 2016-10-04 | Disposition: A | Discharge status: Home / Self Care | Payer: 59 | Source: Ambulatory Visit | Attending: Gastroenterology | Admitting: Gastroenterology

## 2016-10-04 ENCOUNTER — Encounter: Payer: 59 | Admitting: Acute Care

## 2016-10-04 DIAGNOSIS — R1013 Epigastric pain: Secondary | ICD-10-CM

## 2016-10-04 MED ORDER — IOPAMIDOL (ISOVUE-300) INJECTION 61%
100.0000 mL | Freq: Once | INTRAVENOUS | Status: AC | PRN
  Administered 2016-10-04: 100 mL via INTRAVENOUS

## 2016-10-11 ENCOUNTER — Encounter: Payer: Self-pay | Admitting: Acute Care

## 2016-10-11 ENCOUNTER — Ambulatory Visit (INDEPENDENT_AMBULATORY_CARE_PROVIDER_SITE_OTHER): Payer: 59 | Admitting: Acute Care

## 2016-10-11 ENCOUNTER — Ambulatory Visit (INDEPENDENT_AMBULATORY_CARE_PROVIDER_SITE_OTHER)
Admission: RE | Admit: 2016-10-11 | Discharge: 2016-10-11 | Disposition: A | Discharge status: Home / Self Care | Payer: 59 | Source: Ambulatory Visit | Attending: Acute Care | Admitting: Acute Care

## 2016-10-11 DIAGNOSIS — Z87891 Personal history of nicotine dependence: Secondary | ICD-10-CM

## 2016-10-11 NOTE — Progress Notes (Signed)
Shared Decision Making Visit Lung Cancer Screening Program [phone])   Eligibility:  Age 56 y.o.  Pack Years Smoking History Calculation 37 pack years (# packs/per year x # years smoked)  Recent History of coughing up blood  no  Unexplained weight loss? no ( >Than 15 pounds within the last 6 months )  Prior History Lung / other cancer no (Diagnosis within the last 5 years already requiring surveillance chest CT Scans).  Smoking Status Former Smoker  Former Smokers: Years since quit: 3 years  Quit Date: 210/2015  Visit Components:  Discussion included one or more decision making aids. yes  Discussion included risk/benefits of screening. yes  Discussion included potential follow up diagnostic testing for abnormal scans. yes  Discussion included meaning and risk of over diagnosis. yes  Discussion included meaning and risk of False Positives. yes  Discussion included meaning of total radiation exposure. yes  Counseling Included:  Importance of adherence to annual lung cancer LDCT screening. yes  Impact of comorbidities on ability to participate in the program. yes  Ability and willingness to under diagnostic treatment. yes  Smoking Cessation Counseling:  Current Smokers:   Discussed importance of smoking cessation. yes  Information about tobacco cessation classes and interventions provided to patient. yes  Patient provided with "ticket" for LDCT Scan. yes  Symptomatic Patient. no  Counseling  Diagnosis Code: Tobacco Use Z72.0  Asymptomatic Patient yes  Counseling (Intermediate counseling: > three minutes counseling) [id number]  Former Smokers:   Discussed the importance of maintaining cigarette abstinence. yes  Diagnosis Code: Personal History of Nicotine Dependence. [id number]  Information about tobacco cessation classes and interventions provided to patient. Yes  Patient provided with "ticket" for LDCT Scan. yes  Written Order for Lung Cancer Screening  with LDCT placed in Epic. Yes (CT Chest Lung Cancer Screening Low Dose W/O CM) [id number] Z12.2-Screening of respiratory organs Z87.891-Personal history of nicotine dependence  I spent 25 minutes of face to face time with Tracy Brown discussing the risks and benefits of lung cancer screening. We viewed a power point together that explained in detail the above noted topics. We took the time to pause the power point at intervals to allow for questions to be asked and answered to ensure understanding. We discussed that she had taken the single most powerful action possible to decrease her risk of developing lung cancer when she quit smoking. I counseled her to remain smoke free, and to contact me if she ever had the desire to smoke again so that I can provide resources and tools to help support the effort to remain smoke free. We discussed the time and location of the scan, and that either [place] or I will call with the results within  24-48 hours of receiving them. She has my card and contact information in the event she needs to speak with me, in addition to a copy of the power point we reviewed as a resource. Tracy Brown verbalized understanding of all of the above and had no further questions upon leaving the office.   I explained that there was a high incidence of CAD noted on these scans. She is not taking a statin. I told her if this was noted on her exam to follow up with her PCP. I will fax her PCP a copy of the scan results. She verbalized understanding.   Magdalen Spatz, NP 10/11/2016

## 2016-10-14 ENCOUNTER — Telehealth: Payer: Self-pay | Admitting: Acute Care

## 2016-10-14 DIAGNOSIS — R911 Solitary pulmonary nodule: Secondary | ICD-10-CM

## 2016-10-14 NOTE — Telephone Encounter (Signed)
Spoke with pt about her CT results. Pt agreed to the order of the repeat CT scan. The order was placed. Results faxed to  Pcp. Pt is aware that if she has any further questions when SG will be back into the office. Nothing further is needed at this time.

## 2016-10-15 ENCOUNTER — Other Ambulatory Visit: Payer: Self-pay | Admitting: Acute Care

## 2016-10-15 DIAGNOSIS — Z87891 Personal history of nicotine dependence: Secondary | ICD-10-CM

## 2017-02-09 DIAGNOSIS — D126 Benign neoplasm of colon, unspecified: Secondary | ICD-10-CM

## 2017-02-09 HISTORY — DX: Benign neoplasm of colon, unspecified: D12.6

## 2017-02-28 HISTORY — PX: COLONOSCOPY: SHX174

## 2017-05-01 ENCOUNTER — Encounter: Payer: Self-pay | Admitting: Family Medicine

## 2017-05-07 ENCOUNTER — Ambulatory Visit (INDEPENDENT_AMBULATORY_CARE_PROVIDER_SITE_OTHER): Payer: 59 | Admitting: Family Medicine

## 2017-05-07 ENCOUNTER — Encounter: Payer: Self-pay | Admitting: Family Medicine

## 2017-05-07 VITALS — BP 118/84 | Temp 97.7°F | Ht 62.0 in | Wt 171.0 lb

## 2017-05-07 DIAGNOSIS — R1011 Right upper quadrant pain: Secondary | ICD-10-CM

## 2017-05-07 DIAGNOSIS — Z0000 Encounter for general adult medical examination without abnormal findings: Secondary | ICD-10-CM

## 2017-05-07 DIAGNOSIS — Z0001 Encounter for general adult medical examination with abnormal findings: Secondary | ICD-10-CM | POA: Diagnosis not present

## 2017-05-07 DIAGNOSIS — R946 Abnormal results of thyroid function studies: Secondary | ICD-10-CM | POA: Diagnosis not present

## 2017-05-07 DIAGNOSIS — Z23 Encounter for immunization: Secondary | ICD-10-CM | POA: Diagnosis not present

## 2017-05-07 DIAGNOSIS — E039 Hypothyroidism, unspecified: Secondary | ICD-10-CM

## 2017-05-07 LAB — HEPATIC FUNCTION PANEL
ALK PHOS: 64 U/L (ref 39–117)
ALT: 17 U/L (ref 0–35)
AST: 15 U/L (ref 0–37)
Albumin: 4.6 g/dL (ref 3.5–5.2)
BILIRUBIN DIRECT: 0.1 mg/dL (ref 0.0–0.3)
TOTAL PROTEIN: 7.2 g/dL (ref 6.0–8.3)
Total Bilirubin: 0.4 mg/dL (ref 0.2–1.2)

## 2017-05-07 LAB — POC URINALSYSI DIPSTICK (AUTOMATED)
Bilirubin, UA: NEGATIVE
Blood, UA: NEGATIVE
CLARITY UA: NEGATIVE
GLUCOSE UA: NEGATIVE
Ketones, UA: NEGATIVE
Leukocytes, UA: NEGATIVE
Nitrite, UA: NEGATIVE
Protein, UA: NEGATIVE
Spec Grav, UA: 1.02 (ref 1.010–1.025)
UROBILINOGEN UA: 0.2 U/dL
pH, UA: 6 (ref 5.0–8.0)

## 2017-05-07 LAB — CBC WITH DIFFERENTIAL/PLATELET
BASOS PCT: 0.9 % (ref 0.0–3.0)
Basophils Absolute: 0.1 10*3/uL (ref 0.0–0.1)
EOS PCT: 1.6 % (ref 0.0–5.0)
Eosinophils Absolute: 0.1 10*3/uL (ref 0.0–0.7)
HCT: 39.7 % (ref 36.0–46.0)
HEMOGLOBIN: 13.1 g/dL (ref 12.0–15.0)
Lymphocytes Relative: 41.5 % (ref 12.0–46.0)
Lymphs Abs: 2.7 10*3/uL (ref 0.7–4.0)
MCHC: 33 g/dL (ref 30.0–36.0)
MCV: 86.4 fl (ref 78.0–100.0)
MONOS PCT: 7.4 % (ref 3.0–12.0)
Monocytes Absolute: 0.5 10*3/uL (ref 0.1–1.0)
Neutro Abs: 3.1 10*3/uL (ref 1.4–7.7)
Neutrophils Relative %: 48.6 % (ref 43.0–77.0)
Platelets: 258 10*3/uL (ref 150.0–400.0)
RBC: 4.6 Mil/uL (ref 3.87–5.11)
RDW: 14.4 % (ref 11.5–15.5)
WBC: 6.4 10*3/uL (ref 4.0–10.5)

## 2017-05-07 LAB — BASIC METABOLIC PANEL
BUN: 11 mg/dL (ref 6–23)
CALCIUM: 9.8 mg/dL (ref 8.4–10.5)
CO2: 29 meq/L (ref 19–32)
Chloride: 103 mEq/L (ref 96–112)
Creatinine, Ser: 0.84 mg/dL (ref 0.40–1.20)
GFR: 74.6 mL/min (ref >60.00)
Glucose, Bld: 86 mg/dL (ref 70–99)
Potassium: 4.2 mEq/L (ref 3.5–5.1)
SODIUM: 139 meq/L (ref 135–145)

## 2017-05-07 LAB — TSH: TSH: 12.3 u[IU]/mL — ABNORMAL HIGH (ref 0.35–4.50)

## 2017-05-07 LAB — LIPID PANEL
Cholesterol: 225 mg/dL — ABNORMAL HIGH (ref 0–200)
HDL: 71.5 mg/dL (ref >39.00)
LDL Cholesterol: 136 mg/dL — ABNORMAL HIGH (ref 0–99)
NONHDL: 153.4
Total CHOL/HDL Ratio: 3
Triglycerides: 89 mg/dL (ref 0.0–149.0)
VLDL: 17.8 mg/dL (ref 0.0–40.0)

## 2017-05-07 MED ORDER — PHENTERMINE HCL 37.5 MG PO CAPS: 37.5000 mg | ORAL_CAPSULE | ORAL | 2 refills | Status: DC

## 2017-05-07 NOTE — Progress Notes (Signed)
   Subjective:    Patient ID: Tracy Brown, female    DOB: October 11, 1960, 56 y.o.   MRN: [id number]  HPI Here for a well exam. Tracy Brown spends a good deal of time today expressing frustration over Tracy Brown inability to lose weight. Tracy Brown tries to diet and Tracy Brown walks, but Tracy Brown continues to gain weight. Also Tracy Brown has been seeing Dr. Collene Mares for intermittent RUQ pains, and no etiology has been found as yet. Tracy Brown has had CT scans that were unremarkable. Tracy Brown had another colonoscopy in July that revealed polyps but no source of pain. Tracy Brown had been taking Metamucil, and Dr. Collene Mares recently had had Tracy Brown start on a probiotic. Tracy Brown asks if Tracy Brown can have a second opinion from [place].    Review of Systems  Constitutional: Positive for unexpected weight change. Negative for activity change and appetite change.  HENT: Negative.   Eyes: Negative.   Respiratory: Negative.   Cardiovascular: Negative.   Gastrointestinal: Positive for abdominal pain. Negative for abdominal distention, anal bleeding, blood in stool, constipation, diarrhea, nausea, rectal pain and vomiting.  Genitourinary: Negative for decreased urine volume, difficulty urinating, dyspareunia, dysuria, enuresis, flank pain, frequency, hematuria, pelvic pain and urgency.  Musculoskeletal: Negative.   Skin: Negative.   Neurological: Negative.   Psychiatric/Behavioral: Negative.        Objective:   Physical Exam  Constitutional: Tracy Brown is oriented to person, place, and time. Tracy Brown appears well-developed and well-nourished. No distress.  HENT:  Head: Normocephalic and atraumatic.  Right Ear: External ear normal.  Left Ear: External ear normal.  Nose: Nose normal.  Mouth/Throat: Oropharynx is clear and moist. No oropharyngeal exudate.  Eyes: Pupils are equal, round, and reactive to light. Conjunctivae and EOM are normal. No scleral icterus.  Neck: Normal range of motion. Neck supple. No JVD present. No thyromegaly present.  Cardiovascular: Normal rate, regular rhythm,  normal heart sounds and intact distal pulses.  Exam reveals no gallop and no friction rub.   No murmur heard. Pulmonary/Chest: Effort normal and breath sounds normal. No respiratory distress. Tracy Brown has no wheezes. Tracy Brown has no rales. Tracy Brown exhibits no tenderness.  Abdominal: Soft. Bowel sounds are normal. Tracy Brown exhibits no distension and no mass. There is no tenderness. There is no rebound and no guarding.  Musculoskeletal: Normal range of motion. Tracy Brown exhibits no edema or tenderness.  Lymphadenopathy:    Tracy Brown has no cervical adenopathy.  Neurological: Tracy Brown is alert and oriented to person, place, and time. Tracy Brown has normal reflexes. No cranial nerve deficit. Tracy Brown exhibits normal muscle tone. Coordination normal.  Skin: Skin is warm and dry. No rash noted. No erythema.  Psychiatric: Tracy Brown has a normal mood and affect. Tracy Brown behavior is normal. Judgment and thought content normal.          Assessment & Plan:  Well exam. We discussed diet and exercise. I suggested Tracy Brown try a program like Weight Watchers. Get fasting labs today. Tracy Brown will try Phentermine for 90 days. Refer to GI about the RUQ pain.  Alysia Penna, MD

## 2017-05-07 NOTE — Patient Instructions (Signed)
WE NOW OFFER    [facility] FAST TRACK!!!  SAME DAY Appointments for ACUTE CARE  Such as: Sprains, Injuries, cuts, abrasions, rashes, muscle pain, joint pain, back pain Colds, flu, sore throats, headache, allergies, cough, fever  Ear pain, sinus and eye infections Abdominal pain, nausea, vomiting, diarrhea, upset stomach Animal/insect bites  3 Easy Ways to Schedule: Walk-In Scheduling Call in scheduling Mychart Sign-up: [email or web address]         

## 2017-05-12 MED ORDER — SYNTHROID 75 MCG PO TABS: 75.0000 ug | ORAL_TABLET | Freq: Every day | ORAL | 0 refills | Status: DC

## 2017-05-12 NOTE — Addendum Note (Signed)
Addended by: Lahoma Crocker A on: 05/12/2017 10:30 AM   Modules accepted: Orders

## 2017-05-12 NOTE — Addendum Note (Signed)
Addended by: Agnes Lawrence on: 05/12/2017 10:32 AM   Modules accepted: Orders

## 2017-05-14 ENCOUNTER — Telehealth: Payer: Self-pay | Admitting: Gastroenterology

## 2017-05-14 NOTE — Telephone Encounter (Signed)
Received referral for patient to be seen by Dr.Stark for RUQ abd pain. Patient has been seen in gi office in past year. Records scanned in media but printed out and placed on Dr.Stark's desk for review.

## 2017-05-20 NOTE — Telephone Encounter (Signed)
Dr.Stark reviewed records and accepted patient for an ov. Left message on home phone for patient to call back and schedule. Records placed in referral folder for now.

## 2017-05-26 ENCOUNTER — Encounter: Payer: Self-pay | Admitting: Gastroenterology

## 2017-05-28 ENCOUNTER — Telehealth: Payer: Self-pay | Admitting: Family Medicine

## 2017-05-28 NOTE — Telephone Encounter (Signed)
° ° ° °  Pt would like a call back concerning the below med  phentermine 37.5 MG capsule  SYNTHROID 75 MCG tablet

## 2017-05-28 NOTE — Telephone Encounter (Signed)
Per Dr. [name] pt should take Synthroid in the moning and wait at least 30 minutes before eating or taking other medications. I spoke with pt and went over this.

## 2017-06-03 ENCOUNTER — Telehealth: Payer: Self-pay | Admitting: Family Medicine

## 2017-06-03 ENCOUNTER — Other Ambulatory Visit: Payer: Self-pay | Admitting: Family Medicine

## 2017-06-03 DIAGNOSIS — Z1231 Encounter for screening mammogram for malignant neoplasm of breast: Secondary | ICD-10-CM

## 2017-06-03 NOTE — Telephone Encounter (Signed)
Patient Name: Tracy Brown DOB: [birth year] Initial Comment Caller states that she medication she just started taking has been making her sweat and grit her teeth at night. Nurse Assessment Nurse: Ronnald Ramp, RN, [place] Date/Time (Eastern Time): 06/03/2017 12:18:50 PM Confirm and document reason for call. If symptomatic, describe symptoms. ---Caller states she started take Phentermine yesterday morning. Last night she did not sleep well, she was sweating, and gritting her teeth. Does the patient have any new or worsening symptoms? ---Yes Will a triage be completed? ---No Select reason for no triage. ---Other Please document clinical information provided and list any resource used. ---No triage because no appropriate guideline that leads to a symptom based outcome. Per drugs.com these are normal side effects to the Phentermine and should improve as her body adjusts to the medication. Guidelines Guideline Title Affirmed Question Affirmed Notes Final Disposition User Clinical Call Ronnald Ramp, RN, [employer]

## 2017-06-03 NOTE — Telephone Encounter (Signed)
I spoke with pt and she does want to try another day or so. Per Dr. [name] pt can just stop the medication.

## 2017-06-05 ENCOUNTER — Ambulatory Visit (INDEPENDENT_AMBULATORY_CARE_PROVIDER_SITE_OTHER): Payer: 59 | Admitting: Family Medicine

## 2017-06-05 ENCOUNTER — Encounter: Payer: Self-pay | Admitting: Family Medicine

## 2017-06-05 VITALS — Temp 98.4°F | Ht 62.0 in | Wt 169.0 lb

## 2017-06-05 DIAGNOSIS — L509 Urticaria, unspecified: Secondary | ICD-10-CM | POA: Diagnosis not present

## 2017-06-05 DIAGNOSIS — E039 Hypothyroidism, unspecified: Secondary | ICD-10-CM | POA: Diagnosis not present

## 2017-06-05 NOTE — Patient Instructions (Signed)
WE NOW OFFER   [place] [facility] FAST TRACK!!!  SAME DAY Appointments for ACUTE CARE  Such as: Sprains, Injuries, cuts, abrasions, rashes, muscle pain, joint pain, back pain Colds, flu, sore throats, headache, allergies, cough, fever  Ear pain, sinus and eye infections Abdominal pain, nausea, vomiting, diarrhea, upset stomach Animal/insect bites  3 Easy Ways to Schedule: Walk-In Scheduling Call in scheduling Mychart Sign-up: [email or web address]         

## 2017-06-05 NOTE — Progress Notes (Signed)
   Subjective:    Patient ID: Tracy Brown, female    DOB: 23-Sep-1960, 56 y.o.   MRN: [id number]  HPI Here for 2 days of itching on her arms and trunk, as well as a fine red intermittent rash. She recently saw [name] and was started on Levothyroxine and Phentermine. She took only 2 doses of the Phentermine and stopped it because it made her jittery and affected her sleep. No lip swelling or SOB, etc.    Review of Systems  Constitutional: Negative.   HENT: Negative.   Eyes: Negative.   Respiratory: Negative.   Cardiovascular: Negative.   Skin: Positive for rash.  Neurological: Negative.        Objective:   Physical Exam  Constitutional: She appears well-developed and well-nourished. No distress.  HENT:  Mouth/Throat: Oropharynx is clear and moist.  Neck: No thyromegaly present.  Cardiovascular: Normal rate, regular rhythm, normal heart sounds and intact distal pulses.   Pulmonary/Chest: Effort normal and breath sounds normal. No respiratory distress. She has no wheezes. She has no rales.  Lymphadenopathy:    She has no cervical adenopathy.  Skin: No rash noted.          Assessment & Plan:  She is having an allergic reaction to something, and the Phentermine is the most likely agent. She will stop this and use Benadryl 25 mg every 4 hours prn. The itching should stop in the next 24 hours or so. She will continue to take Levothyroxine. Recheck prn.  Alysia Penna, MD

## 2017-06-09 ENCOUNTER — Other Ambulatory Visit: Payer: Self-pay | Admitting: Family Medicine

## 2017-06-09 MED ORDER — METHYLPREDNISOLONE 4 MG PO TBPK: ORAL_TABLET | ORAL | 0 refills | Status: DC

## 2017-06-09 NOTE — Telephone Encounter (Signed)
Pt is still itching, was seen previously in office for this. Per Dr [name] call in Medrol dose pack.

## 2017-06-09 NOTE — Telephone Encounter (Signed)
I sent script e-scribe to [facility] and spoke with pt.

## 2017-06-25 ENCOUNTER — Ambulatory Visit
Admission: RE | Admit: 2017-06-25 | Discharge: 2017-06-25 | Disposition: A | Discharge status: Home / Self Care | Payer: 59 | Source: Ambulatory Visit | Attending: Family Medicine | Admitting: Family Medicine

## 2017-06-25 DIAGNOSIS — Z1231 Encounter for screening mammogram for malignant neoplasm of breast: Secondary | ICD-10-CM

## 2017-07-22 ENCOUNTER — Ambulatory Visit: Payer: 59 | Admitting: Gastroenterology

## 2017-08-01 ENCOUNTER — Ambulatory Visit: Payer: 59 | Admitting: Physician Assistant

## 2017-08-01 ENCOUNTER — Encounter: Payer: Self-pay | Admitting: Family Medicine

## 2017-08-01 ENCOUNTER — Ambulatory Visit (INDEPENDENT_AMBULATORY_CARE_PROVIDER_SITE_OTHER): Payer: 59 | Admitting: Family Medicine

## 2017-08-01 VITALS — BP 120/72 | HR 74 | Temp 97.9°F | Wt 172.8 lb

## 2017-08-01 DIAGNOSIS — E039 Hypothyroidism, unspecified: Secondary | ICD-10-CM | POA: Diagnosis not present

## 2017-08-01 LAB — TSH: TSH: 1.21 u[IU]/mL (ref 0.35–4.50)

## 2017-08-01 LAB — T4, FREE: Free T4: 0.97 ng/dL (ref 0.60–1.60)

## 2017-08-01 LAB — T3, FREE: T3, Free: 3.4 pg/mL (ref 2.3–4.2)

## 2017-08-01 NOTE — Progress Notes (Signed)
   Subjective:    Patient ID: Tracy Brown, female    DOB: April 01, 1961, 56 y.o.   MRN: [id number]  HPI Here to follow up on hypothyroidism. This was diagnosed 3 months ago and she has been taking Synthroid since then. She feels well but has not been able to lose any weight.    Review of Systems  Constitutional: Negative.   Respiratory: Negative.   Cardiovascular: Negative.   Gastrointestinal: Negative.   Endocrine: Negative.        Objective:   Physical Exam  Constitutional: She is oriented to person, place, and time. She appears well-developed and well-nourished.  Neck: No thyromegaly present.  Cardiovascular: Normal rate, regular rhythm, normal heart sounds and intact distal pulses.  Pulmonary/Chest: Effort normal and breath sounds normal. No respiratory distress. She has no wheezes. She has no rales.  Lymphadenopathy:    She has no cervical adenopathy.  Neurological: She is alert and oriented to person, place, and time.          Assessment & Plan:  Hypothyroidism. Check a thyroid panel today. Alysia Penna, MD

## 2017-08-06 ENCOUNTER — Ambulatory Visit: Payer: 59 | Admitting: Family Medicine

## 2017-08-09 ENCOUNTER — Other Ambulatory Visit: Payer: Self-pay | Admitting: Family Medicine

## 2017-08-11 NOTE — Telephone Encounter (Signed)
Last OV 08/01/2017. TSH was last tested 08/01/2017. Rx was refilled today

## 2017-08-11 NOTE — Telephone Encounter (Signed)
Copied from [place] [id number]. Topic: Quick Communication - Rx Refill/Question >> Aug 11, 2017  8:40 AM [name] wrote: Has the patient contacted their pharmacy? Yes.     (Agent: If no, request that the patient contact the pharmacy for the refill.)   Preferred Pharmacy (with phone number or street name): [facility], close at 2 today   Agent: Please be advised that RX refills may take up to 3 business days. We ask that you follow-up with your pharmacy. Requesting refill on levothyroxine (SYNTHROID, LEVOTHROID) 75 MCG tablet

## 2017-08-15 ENCOUNTER — Telehealth: Payer: Self-pay | Admitting: Family Medicine

## 2017-08-15 NOTE — Telephone Encounter (Signed)
Copied from [place] [id number]. Topic: Quick Communication - Rx Refill/Question >> Aug 11, 2017  8:40 AM [name] wrote: Has the patient contacted their pharmacy? Yes.     (Agent: If no, request that the patient contact the pharmacy for the refill.)   Preferred Pharmacy (with phone number or street name): [facility]   Agent: Please be advised that RX refills may take up to 3 business days. We ask that you follow-up with your pharmacy. Requesting refill on levothyroxine (SYNTHROID, LEVOTHROID) 75 MCG tablet  >> Aug 15, 2017 11:19 AM [name] wrote:  Relation to pt: self  Call back number:[phone]   Reason for call:  Patient would like to speak with the head nurse or office manager regarding her synthroid refill. patient states Rx was suppose to be filled on 08/04/17 and medication was sent to pharmacy yesterday 08/14/17 therefore patient had to pay an extra $30 due to her not meeting her deductible due to it being a new year, please advise

## 2017-08-15 NOTE — Telephone Encounter (Signed)
Can you take a look at this please?

## 2017-08-18 NOTE — Telephone Encounter (Addendum)
Called patient and left message to return call.  Reviewed chart, we did not receive a refill request on 08/04/17, but did give lab results on that date. We received refill request on 08/09/17, and refill was sent on 08/11/17 (prior to end of the year).  Contacted patient's pharmacy, and they did receive the refill on 08/11/17, but did not fill the medication until after the start of the year.

## 2017-08-19 NOTE — Telephone Encounter (Signed)
Spoke with patient. She came into the office on 08/01/17 for medication follow-up and states the sole purpose of her visit was to get her labs checked and then get refills on synthroid. She was notified via VM on 08/04/17 that her labs were normal and she should continue her current dose of medication. She reports she then returned call to the office on 08/04/17 to verify that a refill had been sent, and she was told that it had been. There is no documentation of this call in her chart, and no refill was sent on 08/04/17.   On 08/09/17, she went to her pharmacy to pick up her medication and then found out it had not been sent. The pharmacy sent an electronic refill on this date, which was on a Saturday. Our office then filled this medication on 08/11/17 at 1224. She also called on this date to ask that medication be sent ASAP because they pharmacy closed at 2pm.   Her medication was not filled by pharmacy or picked up until after the new year and was subject to her deductible, which she had previously met for 2018.   She is requesting to be compensated/credited for the $30 out of pocket cost she had to pay because the medication was not filled by the pharmacy before the new year. She states this never should have been an issue because this should have been automatically taken care of when her labs were resulted, because medication refills were the whole purpose of her visit. She does not feel that she should have had to make multiple phone calls to "ask people to do their job."  Please advise if this is possible and contact patient.

## 2017-08-25 NOTE — Telephone Encounter (Signed)
Pt calling to see when she may receive a call from [place] regarding this matter. Please advise.

## 2017-09-09 NOTE — Telephone Encounter (Signed)
Patient checking status of $30 credit. Please advise

## 2017-09-19 ENCOUNTER — Ambulatory Visit: Payer: 59 | Admitting: Gastroenterology

## 2017-09-24 ENCOUNTER — Encounter: Payer: Self-pay | Admitting: Gastroenterology

## 2017-09-24 ENCOUNTER — Ambulatory Visit (INDEPENDENT_AMBULATORY_CARE_PROVIDER_SITE_OTHER): Payer: 59 | Admitting: Gastroenterology

## 2017-09-24 VITALS — BP 100/80 | HR 76 | Ht 62.0 in | Wt 174.6 lb

## 2017-09-24 DIAGNOSIS — K219 Gastro-esophageal reflux disease without esophagitis: Secondary | ICD-10-CM | POA: Diagnosis not present

## 2017-09-24 DIAGNOSIS — Z86010 Personal history of colonic polyps: Secondary | ICD-10-CM | POA: Diagnosis not present

## 2017-09-24 DIAGNOSIS — R109 Unspecified abdominal pain: Secondary | ICD-10-CM

## 2017-09-24 NOTE — Patient Instructions (Signed)
You have been scheduled for an abdominal ultrasound at [facility] (1st floor of hospital) on 09/30/17 at 10:30am. Please arrive 15 minutes prior to your appointment for registration. Make certain not to have anything to eat or drink 6 hours prior to your appointment. Should you need to reschedule your appointment, please contact radiology at [phone]. This test typically takes about 30 minutes to perform.  Please start over the counter Prilosec daily x 1 month for reflux symptoms.   Thank you for choosing me and [facility].  Pricilla Riffle. Dagoberto Ligas., MD., [facility]

## 2017-09-24 NOTE — Progress Notes (Signed)
History of Present Illness: This is a 57 year old female referred by Laurey Morale, MD for a second opinion regarding right-sided abdominal pain.  Patient is followed by Dr. 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  Since recovering from her surgery she has had intermittent episodes of mild pain overlying her right sided abdominal incision. Occasionally the pain radiates up the midline of her abdomen.  She describes it as an ache or bruise or soreness. It can last for several days at a time and then not be bothersome for months at a time.  It does not change with meals or bowel movements.  It has not substantially changed in frequency or severity since 2012.  She had follow-up colonoscopies performed in August 2016 and again in July 2018. Her most recent colonoscopy revealed 1 serrated sessile adenoma, mild sigmoid colon diverticulosis and prior right hemicolectomy.  She underwent EGD in February 2018 which was normal.  Random gastric biopsies showed mild reactive gastropathy and random esophageal biopsies were normal.  Abdominal CT scan as below.  She relates intermittent difficulties with nausea and mild heartburn symptoms for the past several weeks that appear to be unrelated to her intermittent right sided abdominal complaints. Denies weight loss, constipation, diarrhea, change in stool caliber, melena, hematochezia, vomiting, dysphagia,  chest pain.   Abd/pelvic CT 09/2016 Intraluminal soft tissue density in right colon at site of ileocolonic anastomosis. Although this may represent stool, air recurrent colonic mass at this site cannot be excluded. Consider colonoscopy or CT enterography for further evaluation.  No evidence of bowel obstruction or other acute findings.  Bilateral nonobstructing nephrolithiasis.  Aortic atherosclerosis.  Allergies  Allergen Reactions  . Hydrocodone   . Phentermine Itching  . Zithromax [Azithromycin  Dihydrate]    Outpatient Medications Prior to Visit  Medication Sig Dispense Refill  . aspirin 81 MG chewable tablet Chew 81 mg by mouth daily.    . calcium carbonate (OSCAL) 1500 (600 Ca) MG TABS tablet Take 600 tablets by mouth 2 (two) times daily with a meal.    . cetirizine (ZYRTEC) 10 MG tablet Take 10 mg by mouth daily.    Marland Kitchen ibuprofen (ADVIL,MOTRIN) 200 MG tablet Take 200 mg by mouth every 6 (six) hours as needed.    Marland Kitchen levothyroxine (SYNTHROID, LEVOTHROID) 75 MCG tablet Take 75 mcg by mouth daily before breakfast.    . MULTIPLE VITAMINS PO Take by mouth.    . methylPREDNISolone (MEDROL) 4 MG tablet Take 4 mg by mouth daily.    Marland Kitchen SYNTHROID 75 MCG tablet TAKE 1 TABLET BY MOUTH EVERY DAY BEFORE breakfast. 90 tablet 1   No facility-administered medications prior to visit.    Past Medical History:  Diagnosis Date  . Allergy   . Anemia   . Chickenpox   . Colon polyps   . Diverticulosis   . Heart murmur   . History of recurrent UTIs   . Hyperlipidemia   . Hypertension   . Nephrolithiasis   . OA (osteoarthritis)   . Serrated adenoma of colon 02/2017   Past Surgical History:  Procedure Laterality Date  . APPENDECTOMY    . BREAST CYST ASPIRATION Right   . COLON SURGERY  August 2012   right colectomy, per Dr. Judeth Horn, for tubular  adenoma   . COLONOSCOPY  02/28/2017   per Dr. Collene Mares, sessile serrated polyps, repeat in 5 years   . HEMORRHOID SURGERY    .  HERNIA REPAIR    . 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 TONSILLECTOMY     Social History   Socioeconomic History  . Marital status: Divorced    Spouse name: None  . Number of children: 1  . Years of education: None  . Highest education level: None  Social Needs  . Financial resource strain: None  . Food insecurity - worry: None  . Food insecurity - inability: None  . Transportation needs - medical: None  . Transportation needs - non-medical: None  Occupational History  .  None  Tobacco Use  . Smoking status: Former Smoker    Packs/day: 1.00    Years: 37.00    Pack years: 37.00    Last attempt to quit: 2015    Years since quitting: 4.1  . Smokeless tobacco: Never Used  Substance and Sexual Activity  . Alcohol use: Yes    Alcohol/week: 0.0 oz    Comment: once a month  . Drug use: No  . Sexual activity: None  Other Topics Concern  . None  Social History Narrative  . None   Family History  Problem Relation Age of Onset  . Cancer Other        breast,colon,ovarian  . Coronary artery disease Other   . Hypertension Other   . Stroke Other   . Leukemia Father   . Breast cancer Maternal Grandfather   . Breast cancer Cousin       Review of Systems: Pertinent positive and negative review of systems were noted in the above HPI section. All other review of systems were otherwise negative.    Physical Exam: General: Well developed, well nourished, no acute distress Head: Normocephalic and atraumatic Eyes:  sclerae anicteric, EOMI Ears: Normal auditory acuity Mouth: No deformity or lesions Neck: Supple, no masses or thyromegaly Lungs: Clear throughout to auscultation Heart: Regular rate and rhythm; no murmurs, rubs or bruits Abdomen: Soft, non tender and non distended.  Well-healed right mid abdomen incision.  She relates general area of abdominal pain is around that incision and extending up from its medial border along the midline.  No masses, hepatosplenomegaly or hernias noted. Normal Bowel sounds Rectal: not done Musculoskeletal: Symmetrical with no gross deformities  Skin: No lesions on visible extremities Pulses:  Normal pulses noted Extremities: No clubbing, cyanosis, edema or deformities noted Neurological: Alert oriented x 4, grossly nonfocal Cervical Nodes:  No significant cervical adenopathy Inguinal Nodes: No significant inguinal adenopathy Psychological:  Alert and cooperative. Normal mood and affect  Assessment and  Recommendations:  1.  Right-sided abdominal pain, mild, intermittent.  Suspect abdominal wall pain or intra-abdominal adhesions.  Recommended Tylenol or Advil as needed.  Schedule RUQ ultrasound to better evaluate for cholelithiasis although her symptoms are atypical for cholelithiasis.  As this was a second opinion the patient has plan ongoing GI follow-up with Dr. Juanita Craver.  2.  Nausea and GERD trial of omeprazole 20 mg p.o. every morning and standard antireflux measures for 1 month.  Ongoing follow-up with Dr. Collene Mares.  Personal history of tubulovillous3.  Adenoma status post right hemicolectomy.  Interval colonoscopies as recommended by Dr. Collene Mares.  cc: Laurey Morale, MD [address] [place], [place] [address]

## 2017-09-30 ENCOUNTER — Ambulatory Visit (HOSPITAL_COMMUNITY): Payer: 59

## 2017-10-03 ENCOUNTER — Ambulatory Visit (HOSPITAL_COMMUNITY)
Admission: RE | Admit: 2017-10-03 | Discharge: 2017-10-03 | Disposition: A | Discharge status: Home / Self Care | Payer: 59 | Source: Ambulatory Visit | Attending: Gastroenterology | Admitting: Gastroenterology

## 2017-10-03 DIAGNOSIS — R109 Unspecified abdominal pain: Secondary | ICD-10-CM | POA: Diagnosis not present

## 2017-10-13 ENCOUNTER — Ambulatory Visit (INDEPENDENT_AMBULATORY_CARE_PROVIDER_SITE_OTHER)
Admission: RE | Admit: 2017-10-13 | Discharge: 2017-10-13 | Disposition: A | Discharge status: Home / Self Care | Payer: 59 | Source: Ambulatory Visit | Attending: Acute Care | Admitting: Acute Care

## 2017-10-13 DIAGNOSIS — Z87891 Personal history of nicotine dependence: Secondary | ICD-10-CM | POA: Diagnosis not present

## 2017-10-16 ENCOUNTER — Other Ambulatory Visit: Payer: Self-pay | Admitting: Acute Care

## 2017-10-16 DIAGNOSIS — Z87891 Personal history of nicotine dependence: Secondary | ICD-10-CM

## 2017-10-16 DIAGNOSIS — Z122 Encounter for screening for malignant neoplasm of respiratory organs: Secondary | ICD-10-CM

## 2017-11-11 ENCOUNTER — Telehealth: Payer: Self-pay | Admitting: Family Medicine

## 2017-11-11 DIAGNOSIS — E039 Hypothyroidism, unspecified: Secondary | ICD-10-CM

## 2017-11-11 NOTE — Telephone Encounter (Signed)
Called and spoke with pt. Pt advised and is scheduled for lab work.

## 2017-11-11 NOTE — Telephone Encounter (Signed)
I put in orders to retest her levels

## 2017-11-11 NOTE — Telephone Encounter (Signed)
Sent to PCP for approval to set put up for a lab appt or OV place orders if needed.

## 2017-11-11 NOTE — Telephone Encounter (Signed)
Copied from [place] [id number]. Topic: General - Other >> Nov 11, 2017  7:31 AM Yvette Rack wrote: Reason for CRM: pt stating that she is starting to feel tired again for a couple of weeks and feel like the levothyroxine (SYNTHROID, LEVOTHROID) 75 MCG tablet  need to be adjusted again or have some labs done

## 2017-11-12 ENCOUNTER — Other Ambulatory Visit (INDEPENDENT_AMBULATORY_CARE_PROVIDER_SITE_OTHER): Payer: 59

## 2017-11-12 DIAGNOSIS — E039 Hypothyroidism, unspecified: Secondary | ICD-10-CM | POA: Diagnosis not present

## 2017-11-13 LAB — T4, FREE: FREE T4: 0.74 ng/dL (ref 0.60–1.60)

## 2017-11-13 LAB — T3, FREE: T3 FREE: 2.9 pg/mL (ref 2.3–4.2)

## 2017-11-13 LAB — TSH: TSH: 1.99 u[IU]/mL (ref 0.35–4.50)

## 2017-11-17 ENCOUNTER — Telehealth: Payer: Self-pay | Admitting: Family Medicine

## 2017-11-17 NOTE — Telephone Encounter (Signed)
Called and spoke with pt about her lab results. Pt is happy to know that her lab results came back normal but she is exteremly frustrated. Pt stated that she had her thyroid checked because of weight gain. Pt stated that she is working our 4 days weekly and eating a healthy diet and can NOT seem to lose any weight. Pt is just frustrated since obviously  her thyroid is OK so she doesn't understand what the issue is.

## 2017-11-17 NOTE — Telephone Encounter (Unsigned)
Copied from [place] [id number]. Topic: Quick Communication - Lab Results >> Nov 17, 2017  4:04 PM Percell Belt A wrote: Pt called in and would like someone to call her with her 4/3 lab results

## 2017-11-18 NOTE — Telephone Encounter (Signed)
Called pt and left a detailed VM advised pt to call back if they wish for the referral to be set up.

## 2017-11-18 NOTE — Telephone Encounter (Signed)
I don't have an answer for her. I see a lot of patients with the same situation. I can refer her to a Nutritionist if she wishes

## 2018-01-15 ENCOUNTER — Other Ambulatory Visit: Payer: Self-pay | Admitting: Family Medicine

## 2018-02-06 ENCOUNTER — Encounter: Payer: Self-pay | Admitting: Family Medicine

## 2018-02-06 ENCOUNTER — Ambulatory Visit (INDEPENDENT_AMBULATORY_CARE_PROVIDER_SITE_OTHER): Payer: 59 | Admitting: Family Medicine

## 2018-02-06 ENCOUNTER — Encounter: Payer: 59 | Admitting: Family Medicine

## 2018-02-06 ENCOUNTER — Telehealth: Payer: Self-pay | Admitting: Family Medicine

## 2018-02-06 VITALS — BP 104/70 | HR 70 | Temp 97.7°F | Ht 60.75 in | Wt 158.8 lb

## 2018-02-06 DIAGNOSIS — Z0000 Encounter for general adult medical examination without abnormal findings: Secondary | ICD-10-CM | POA: Diagnosis not present

## 2018-02-06 DIAGNOSIS — M25552 Pain in left hip: Secondary | ICD-10-CM

## 2018-02-06 DIAGNOSIS — E039 Hypothyroidism, unspecified: Secondary | ICD-10-CM | POA: Diagnosis not present

## 2018-02-06 LAB — CBC WITH DIFFERENTIAL/PLATELET
BASOS PCT: 0.6 % (ref 0.0–3.0)
Basophils Absolute: 0 10*3/uL (ref 0.0–0.1)
EOS PCT: 2 % (ref 0.0–5.0)
Eosinophils Absolute: 0.1 10*3/uL (ref 0.0–0.7)
HCT: 40.7 % (ref 36.0–46.0)
HEMOGLOBIN: 13.7 g/dL (ref 12.0–15.0)
LYMPHS ABS: 2.7 10*3/uL (ref 0.7–4.0)
Lymphocytes Relative: 40.4 % (ref 12.0–46.0)
MCHC: 33.6 g/dL (ref 30.0–36.0)
MCV: 85.2 fl (ref 78.0–100.0)
MONO ABS: 0.5 10*3/uL (ref 0.1–1.0)
Monocytes Relative: 7.4 % (ref 3.0–12.0)
NEUTROS PCT: 49.6 % (ref 43.0–77.0)
Neutro Abs: 3.3 10*3/uL (ref 1.4–7.7)
Platelets: 253 10*3/uL (ref 150.0–400.0)
RBC: 4.78 Mil/uL (ref 3.87–5.11)
RDW: 13.6 % (ref 11.5–15.5)
WBC: 6.7 10*3/uL (ref 4.0–10.5)

## 2018-02-06 LAB — POC URINALSYSI DIPSTICK (AUTOMATED)
Bilirubin, UA: NEGATIVE
Blood, UA: NEGATIVE
GLUCOSE UA: NEGATIVE
KETONES UA: NEGATIVE
Leukocytes, UA: NEGATIVE
Nitrite, UA: NEGATIVE
Protein, UA: NEGATIVE
SPEC GRAV UA: 1.015 (ref 1.010–1.025)
Urobilinogen, UA: 0.2 E.U./dL
pH, UA: 6 (ref 5.0–8.0)

## 2018-02-06 LAB — LIPID PANEL
CHOL/HDL RATIO: 3
Cholesterol: 230 mg/dL — ABNORMAL HIGH (ref 0–200)
HDL: 66.7 mg/dL (ref >39.00)
LDL Cholesterol: 146 mg/dL — ABNORMAL HIGH (ref 0–99)
NONHDL: 163.22
Triglycerides: 86 mg/dL (ref 0.0–149.0)
VLDL: 17.2 mg/dL (ref 0.0–40.0)

## 2018-02-06 LAB — BASIC METABOLIC PANEL
BUN: 15 mg/dL (ref 6–23)
CHLORIDE: 104 meq/L (ref 96–112)
CO2: 26 mEq/L (ref 19–32)
CREATININE: 0.75 mg/dL (ref 0.40–1.20)
Calcium: 9.7 mg/dL (ref 8.4–10.5)
GFR: 84.8 mL/min (ref >60.00)
Glucose, Bld: 84 mg/dL (ref 70–99)
POTASSIUM: 4.2 meq/L (ref 3.5–5.1)
Sodium: 138 mEq/L (ref 135–145)

## 2018-02-06 LAB — HEPATIC FUNCTION PANEL
ALT: 16 U/L (ref 0–35)
AST: 14 U/L (ref 0–37)
Albumin: 4.6 g/dL (ref 3.5–5.2)
Alkaline Phosphatase: 68 U/L (ref 39–117)
BILIRUBIN DIRECT: 0.1 mg/dL (ref 0.0–0.3)
BILIRUBIN TOTAL: 0.4 mg/dL (ref 0.2–1.2)
Total Protein: 7.2 g/dL (ref 6.0–8.3)

## 2018-02-06 LAB — TSH: TSH: 2.43 u[IU]/mL (ref 0.35–4.50)

## 2018-02-06 MED ORDER — SYNTHROID 100 MCG PO TABS: 100.0000 ug | ORAL_TABLET | Freq: Every day | ORAL | 3 refills | Status: DC

## 2018-02-06 NOTE — Telephone Encounter (Signed)
You can send it back to me if you want me to put the orders in for the following;  FreeT3, FreeT4, and TSH

## 2018-02-06 NOTE — Telephone Encounter (Signed)
Patient states Dr [name] told her to come back in 3 mths to get a thyroid panel check for labs.  Lab orders aren't in so she wanted to make sure that Dr [name] was going to put those in.

## 2018-02-06 NOTE — Progress Notes (Signed)
   Subjective:    Patient ID: Tracy Brown, female    DOB: November 11, 1960, 57 y.o.   MRN: [id number]  HPI Here for a well exam. She had a few issues to discuss. She is very frustrated about her inability to lose weight, despite watching a strict diet and exercising. She walks 5 miles every day. We did a full thyroid panel in April showing all values in the normal range. Also she has had pain in the anterior and lateral left hip for about one month. No trauma hx.    Review of Systems  Constitutional: Negative.   HENT: Negative.   Eyes: Negative.   Respiratory: Negative.   Cardiovascular: Negative.   Gastrointestinal: Negative.   Genitourinary: Negative for decreased urine volume, difficulty urinating, dyspareunia, dysuria, enuresis, flank pain, frequency, hematuria, pelvic pain and urgency.  Musculoskeletal: Positive for arthralgias.  Skin: Negative.   Neurological: Negative.   Psychiatric/Behavioral: Negative.        Objective:   Physical Exam  Constitutional: She is oriented to person, place, and time. She appears well-developed and well-nourished. No distress.  HENT:  Head: Normocephalic and atraumatic.  Right Ear: External ear normal.  Left Ear: External ear normal.  Nose: Nose normal.  Mouth/Throat: Oropharynx is clear and moist. No oropharyngeal exudate.  Eyes: Pupils are equal, round, and reactive to light. Conjunctivae and EOM are normal. No scleral icterus.  Neck: Normal range of motion. Neck supple. No JVD present. No thyromegaly present.  Cardiovascular: Normal rate, regular rhythm, normal heart sounds and intact distal pulses. Exam reveals no gallop and no friction rub.  No murmur heard. Pulmonary/Chest: Effort normal and breath sounds normal. No respiratory distress. She has no wheezes. She has no rales. She exhibits no tenderness.  Abdominal: Soft. Bowel sounds are normal. She exhibits no distension and no mass. There is no tenderness. There is no rebound and no  guarding.  Musculoskeletal: Normal range of motion. She exhibits no edema.  Mildly tender in the anterior left thigh area. The flexor tendon is not tender. ROM is full.   Lymphadenopathy:    She has no cervical adenopathy.  Neurological: She is alert and oriented to person, place, and time. She has normal reflexes. She displays normal reflexes. No cranial nerve deficit. She exhibits normal muscle tone. Coordination normal.  Skin: Skin is warm and dry. No rash noted. No erythema.  Psychiatric: She has a normal mood and affect. Her behavior is normal. Judgment and thought content normal.          Assessment & Plan:  Well exam. We discussed diet and exercise. Get fasting labs. We will refer her to Sports Medicine for the left hip pain. We will increase the Synthroid to 100 mcg daily and recheck in 90 days.  Alysia Penna, MD

## 2018-02-06 NOTE — Telephone Encounter (Signed)
I did put in the future orders

## 2018-02-06 NOTE — Telephone Encounter (Signed)
Sent to PCP as an FYI   Pt is scheduled for 05/11/2018 for lab appt

## 2018-03-05 ENCOUNTER — Encounter: Payer: Self-pay | Admitting: Sports Medicine

## 2018-03-05 ENCOUNTER — Ambulatory Visit (INDEPENDENT_AMBULATORY_CARE_PROVIDER_SITE_OTHER): Payer: 59 | Admitting: Sports Medicine

## 2018-03-05 VITALS — BP 118/82 | HR 96 | Ht 60.75 in | Wt 165.4 lb

## 2018-03-05 DIAGNOSIS — M7632 Iliotibial band syndrome, left leg: Secondary | ICD-10-CM | POA: Diagnosis not present

## 2018-03-05 DIAGNOSIS — R29898 Other symptoms and signs involving the musculoskeletal system: Secondary | ICD-10-CM

## 2018-03-05 DIAGNOSIS — M25552 Pain in left hip: Secondary | ICD-10-CM | POA: Diagnosis not present

## 2018-03-05 DIAGNOSIS — M24552 Contracture, left hip: Secondary | ICD-10-CM

## 2018-03-05 NOTE — Progress Notes (Signed)
PROCEDURE NOTE: THERAPEUTIC EXERCISES (97110) 15 minutes spent for Therapeutic exercises as below and as referenced in the AVS.  This included exercises focusing on stretching, strengthening, with significant focus on eccentric aspects.   Proper technique shown and discussed handout in great detail with ATC.  All questions were discussed and answered.   Long term goals include an improvement in range of motion, strength, endurance as well as avoiding reinjury. Frequency of visits is one time as determined during today's  office visit. Frequency of exercises to be performed is as per handout.  EXERCISES REVIEWED: Pelvic recruitment Tracy Brown Exercises IT Band stretching

## 2018-03-05 NOTE — Progress Notes (Signed)
Tracy Brown. Tracy Brown, [name] - 57 y.o. female MRN [id number]  Date of birth: November 25, 1960  Visit Date: 03/05/2018  PCP: Laurey Morale, MD   Referred by: Laurey Morale, MD  Scribe(s) for today's visit: Josepha Pigg, CMA  SUBJECTIVE:  Tracy Brown is here for Initial Assessment (L hip pain) .  Referred by: Dr. Alysia Penna  Her L hip pain symptoms INITIALLY: Began about 2 months ago and MOI is unknown. No past injury to L hip or lower back.  Described as mild-moderate aching, shooting, tenderness, and throbbing radiating to the anterior/lateral thigh.  Worsened with sitting, she works from home and sits all day long.  Improved with walking. Additional associated symptoms include: She denies clicking or popping in the joint. She denies LBP. Pain radiates to the L thigh but noy beyond the knee. She sleeps on her L side which makes the pain worse. She reports swelling in her ankles which comes and goes.     At this time symptoms are worsening compared to onset, pain comes and goes.  She has been taking Advil with no relief. She has tried using a heating pad with no relief.   No recent XR of L-spine or L hip.    REVIEW OF SYSTEMS: Reports night time disturbances. Denies fevers, chills, or night sweats. Denies unexplained weight loss. Denies personal history of cancer. Denies changes in bowel or bladder habits. Denies recent unreported falls. Denies new or worsening dyspnea or wheezing. Denies headaches or dizziness.  Reports weakness in L leg.  Denies dizziness or presyncopal episodes Reports lower extremity edema    HISTORY & PERTINENT PRIOR DATA:  Prior History reviewed and updated per electronic medical record.  Significant/pertinent history, findings, studies include:  reports that she quit smoking about 4 years ago. She has a 37.00 pack-year smoking history. She has never used  smokeless tobacco. No results for input(s): HGBA1C, LABURIC, CREATINE in the last 8760 hours. No specialty comments available. No problems updated.  OBJECTIVE:  VS:  HT:5' 0.75" (154.3 cm)   WT:165 lb 6.4 oz (75 kg)  BMI:31.51    BP:118/82  HR:96bpm  TEMP: ( )  RESP:98 %   PHYSICAL EXAM: Constitutional: WDWN, Non-toxic appearing. Psychiatric: Alert & appropriately interactive.  Not depressed or anxious appearing. Respiratory: No increased work of breathing.  Trachea Midline Eyes: Pupils are equal.  EOM intact without nystagmus.  No scleral icterus  Vascular Exam: warm to touch no edema  lower extremity neuro exam: unremarkable normal strength normal sensation normal reflexes  MSK Exam: Bilateral lower extremities overall well aligned without significant deformity.  Good internal/external rotation of bilateral hips.  Marked TTP over the gluteal musculature as well as over the tensor fascia lata on the left.  Marked weakness with hip abduction on the left with TFL predominant recruitment pattern and poor glute medius recruitment. Negative straight leg raise bilaterally   ASSESSMENT & PLAN:   1. Left hip pain   2. Weakness of left hip   3. Left hip flexor tightness   4. Iliotibial band syndrome of left side     PLAN:   Discussed the foundation of treatment for this condition is physical therapy and/or daily (5-6 days/week) therapeutic exercises, focusing on core strengthening, coordination, neuromuscular control/reeducation.  Therapeutic exercises prescribed per procedure note.  Links to [employer] provided today per Patient Instructions.  These exercises were developed by  Minerva Ends, DC with a strong emphasis on core neuromuscular reducation and postural realignment through body-weight exercises.  Reassurance provided.  Return to activities as tolerated with increasing activities slowly no more than 10 %/week.  Follow-up: Return in about 6 weeks  (around 04/16/2018).      Please see additional documentation for Objective, Assessment and Plan sections. Pertinent additional documentation may be included in corresponding procedure notes, imaging studies, problem based documentation and patient instructions. Please see these sections of the encounter for additional information regarding this visit.  CMA/ATC served as Education administrator during this visit. History, Physical, and Plan performed by medical provider. Documentation and orders reviewed and attested to.      Gerda Diss, [facility] Physician

## 2018-03-05 NOTE — Patient Instructions (Addendum)
Also check out [employer]" which is a program developed by Dr. Minerva Ends.   There are links to a couple of his YouTube Videos below and I would like to see performing one of his videos 5-6 days per week.    A good intro video is: "Independence from Pain 7-minute Video" - [email or web address]   Exercises that focus more on the neck are as below: Dr. Archie Balboa with [employer] teaching neck and shoulder details Part 1 - https://youtu.be/[email or web address] Part 2 Dr. Archie Balboa with [facility] quick routine to practice daily - https://youtu.be/[email or web address]  Do not try to attempt the entire video when first beginning.    Try breaking of each exercise that he goes into shorter segments.  Otherwise if they perform an exercise for 45 seconds, start with 15 seconds and rest and then resume when they begin the new activity.  If you work your way up to being able to do these videos without having to stop, I expect you will see significant improvements in your pain.  If you enjoy his videos and would like to find out more you can look on his website: [email or web address]  He has a workout streaming option as well as a DVD set available for purchase.  Amazon has the best price for his DVDs.     Please perform the exercise program that we have prepared for you and gone over in detail on a daily basis.  In addition to the handout you were provided you can access your program through: [email or web address]   Your unique program code is: [email or web address]

## 2018-04-16 ENCOUNTER — Ambulatory Visit: Payer: 59 | Admitting: Sports Medicine

## 2018-04-27 ENCOUNTER — Encounter: Payer: Self-pay | Admitting: Sports Medicine

## 2018-05-11 ENCOUNTER — Other Ambulatory Visit: Payer: 59

## 2018-05-13 ENCOUNTER — Other Ambulatory Visit (INDEPENDENT_AMBULATORY_CARE_PROVIDER_SITE_OTHER): Payer: 59

## 2018-05-13 DIAGNOSIS — E039 Hypothyroidism, unspecified: Secondary | ICD-10-CM | POA: Diagnosis not present

## 2018-05-14 LAB — T3, FREE: T3, Free: 3.3 pg/mL (ref 2.3–4.2)

## 2018-05-14 LAB — TSH: TSH: 0.27 u[IU]/mL — ABNORMAL LOW (ref 0.35–4.50)

## 2018-05-14 LAB — T4, FREE: FREE T4: 1.05 ng/dL (ref 0.60–1.60)

## 2018-05-20 ENCOUNTER — Encounter: Payer: Self-pay | Admitting: *Deleted

## 2018-06-19 ENCOUNTER — Other Ambulatory Visit: Payer: Self-pay | Admitting: Family Medicine

## 2018-06-19 DIAGNOSIS — Z1231 Encounter for screening mammogram for malignant neoplasm of breast: Secondary | ICD-10-CM

## 2018-07-31 ENCOUNTER — Ambulatory Visit
Admission: RE | Admit: 2018-07-31 | Discharge: 2018-07-31 | Disposition: A | Discharge status: Home / Self Care | Payer: 59 | Source: Ambulatory Visit | Attending: Family Medicine | Admitting: Family Medicine

## 2018-07-31 DIAGNOSIS — Z1231 Encounter for screening mammogram for malignant neoplasm of breast: Secondary | ICD-10-CM

## 2018-10-12 ENCOUNTER — Ambulatory Visit (INDEPENDENT_AMBULATORY_CARE_PROVIDER_SITE_OTHER): Payer: 59 | Admitting: Family Medicine

## 2018-10-12 ENCOUNTER — Encounter: Payer: Self-pay | Admitting: Family Medicine

## 2018-10-12 VITALS — BP 124/82 | HR 73 | Temp 97.7°F | Wt 172.1 lb

## 2018-10-12 DIAGNOSIS — M7711 Lateral epicondylitis, right elbow: Secondary | ICD-10-CM | POA: Diagnosis not present

## 2018-10-12 DIAGNOSIS — R635 Abnormal weight gain: Secondary | ICD-10-CM | POA: Diagnosis not present

## 2018-10-12 NOTE — Progress Notes (Signed)
   Subjective:    Patient ID: Tracy Brown, female    DOB: 1960-09-21, 57 y.o.   MRN: [id number]  HPI Here for several issues. First she struck the right elbow against a door knob at home a week ago. She had an immediate sharp pain at the site for 2 days and then this settled down. Then around the same time she developed a pain in the right anterior elbow that radiates down the forearm. No swelling. It hurts to grip anything. In addition she wants to talk about her inability to lose weight despite a strict diet and exercising 4-5 days a week. In fact she has gained a few pounds over the past month.    Review of Systems  Constitutional: Positive for unexpected weight change. Negative for activity change and appetite change.  Respiratory: Negative.   Cardiovascular: Negative.   Musculoskeletal: Positive for myalgias. Negative for joint swelling.  Neurological: Negative.        Objective:   Physical Exam Constitutional:      Appearance: Normal appearance.  Cardiovascular:     Rate and Rhythm: Normal rate and regular rhythm.     Pulses: Normal pulses.     Heart sounds: Normal heart sounds.  Pulmonary:     Effort: Pulmonary effort is normal.     Breath sounds: Normal breath sounds.  Musculoskeletal:     Comments: She is tender over the right lateral epicondyle. No swelling. Full ROM   Neurological:     Mental Status: She is alert.           Assessment & Plan:  She has lateral epicondylitis which was triggered by trauma. She will rest the arm and apply ice packs. Use Ibuprofen prn. We also discussed her weight gain. We will get some labs today to evaluate further.  Alysia Penna, MD

## 2018-10-13 LAB — VITAMIN D 25 HYDROXY (VIT D DEFICIENCY, FRACTURES): VITD: 35.59 ng/mL (ref 30.00–100.00)

## 2018-10-13 LAB — BASIC METABOLIC PANEL
BUN: 19 mg/dL (ref 6–23)
CO2: 25 mEq/L (ref 19–32)
Calcium: 9.8 mg/dL (ref 8.4–10.5)
Chloride: 105 mEq/L (ref 96–112)
Creatinine, Ser: 0.77 mg/dL (ref 0.40–1.20)
GFR: 77.21 mL/min (ref >60.00)
Glucose, Bld: 89 mg/dL (ref 70–99)
POTASSIUM: 4.1 meq/L (ref 3.5–5.1)
Sodium: 140 mEq/L (ref 135–145)

## 2018-10-13 LAB — CBC WITH DIFFERENTIAL/PLATELET
BASOS PCT: 0.9 % (ref 0.0–3.0)
Basophils Absolute: 0.1 10*3/uL (ref 0.0–0.1)
Eosinophils Absolute: 0.1 10*3/uL (ref 0.0–0.7)
Eosinophils Relative: 1.7 % (ref 0.0–5.0)
HCT: 41.1 % (ref 36.0–46.0)
Hemoglobin: 13.7 g/dL (ref 12.0–15.0)
LYMPHS ABS: 3 10*3/uL (ref 0.7–4.0)
Lymphocytes Relative: 37.9 % (ref 12.0–46.0)
MCHC: 33.4 g/dL (ref 30.0–36.0)
MCV: 85.3 fl (ref 78.0–100.0)
Monocytes Absolute: 0.6 10*3/uL (ref 0.1–1.0)
Monocytes Relative: 7.4 % (ref 3.0–12.0)
NEUTROS PCT: 52.1 % (ref 43.0–77.0)
Neutro Abs: 4.1 10*3/uL (ref 1.4–7.7)
Platelets: 270 10*3/uL (ref 150.0–400.0)
RBC: 4.81 Mil/uL (ref 3.87–5.11)
RDW: 13.8 % (ref 11.5–15.5)
WBC: 7.9 10*3/uL (ref 4.0–10.5)

## 2018-10-13 LAB — T3, FREE: T3, Free: 3.6 pg/mL (ref 2.3–4.2)

## 2018-10-13 LAB — TSH: TSH: 0.75 u[IU]/mL (ref 0.35–4.50)

## 2018-10-13 LAB — VITAMIN B12: Vitamin B-12: 359 pg/mL (ref 211–911)

## 2018-10-13 LAB — HEPATIC FUNCTION PANEL
ALT: 19 U/L (ref 0–35)
AST: 15 U/L (ref 0–37)
Albumin: 4.6 g/dL (ref 3.5–5.2)
Alkaline Phosphatase: 70 U/L (ref 39–117)
BILIRUBIN DIRECT: 0.1 mg/dL (ref 0.0–0.3)
Total Bilirubin: 0.3 mg/dL (ref 0.2–1.2)
Total Protein: 7.1 g/dL (ref 6.0–8.3)

## 2018-10-13 LAB — T4, FREE: Free T4: 0.94 ng/dL (ref 0.60–1.60)

## 2018-10-14 ENCOUNTER — Encounter: Payer: Self-pay | Admitting: *Deleted

## 2018-10-14 DIAGNOSIS — E039 Hypothyroidism, unspecified: Secondary | ICD-10-CM

## 2018-10-14 DIAGNOSIS — M7711 Lateral epicondylitis, right elbow: Secondary | ICD-10-CM

## 2018-10-20 ENCOUNTER — Ambulatory Visit (INDEPENDENT_AMBULATORY_CARE_PROVIDER_SITE_OTHER)
Admission: RE | Admit: 2018-10-20 | Discharge: 2018-10-20 | Disposition: A | Discharge status: Home / Self Care | Payer: 59 | Source: Ambulatory Visit | Attending: Acute Care | Admitting: Acute Care

## 2018-10-20 DIAGNOSIS — Z122 Encounter for screening for malignant neoplasm of respiratory organs: Secondary | ICD-10-CM

## 2018-10-20 DIAGNOSIS — Z87891 Personal history of nicotine dependence: Secondary | ICD-10-CM | POA: Diagnosis not present

## 2018-10-20 NOTE — Telephone Encounter (Signed)
Dr. Fry please advise. Thanks  

## 2018-10-21 NOTE — Telephone Encounter (Signed)
I went ahead and did a referral to Endocrine. We need to keep her dosage the same until they see her so as not to mess up any testing they may want to do

## 2018-10-22 ENCOUNTER — Encounter: Payer: Self-pay | Admitting: *Deleted

## 2018-10-22 ENCOUNTER — Other Ambulatory Visit: Payer: Self-pay | Admitting: Acute Care

## 2018-10-22 DIAGNOSIS — Z87891 Personal history of nicotine dependence: Secondary | ICD-10-CM

## 2018-10-22 DIAGNOSIS — Z122 Encounter for screening for malignant neoplasm of respiratory organs: Secondary | ICD-10-CM

## 2018-10-23 ENCOUNTER — Ambulatory Visit: Payer: Self-pay | Admitting: *Deleted

## 2018-10-23 NOTE — Telephone Encounter (Signed)
Dr. [name] please advise on the tennis elbow and the next step for this   Thanks

## 2018-10-23 NOTE — Telephone Encounter (Signed)
I referred her to Sports Medicine

## 2018-10-23 NOTE — Telephone Encounter (Signed)
Contacted pt regarding her symptoms; she is experiencing sore throat, ear pain, headaches, chest discomfort and sniffling; she says that it started with a sore throat 3 days ago; her greatest complaints are her ear pain and sore throat and bilateral ear pain; the pt says that she has taken sudafed, advil, and saline nasal spray,  recommendations made per nurse triage protocol; the pt states that she will call back to schedule appointment.   Reason for Disposition . Earache  (Exceptions: brief ear pain of < 60 minutes duration, earache occurring during air travel  Answer Assessment - Initial Assessment Questions 1. LOCATION: "Which ear is involved?"     Bilateral  2. ONSET: "When did the ear start hurting"      10/20/2018 3. SEVERITY: "How bad is the pain?"  (Scale 1-10; mild, moderate or severe)   - MILD (1-3): doesn't interfere with normal activities    - MODERATE (4-7): interferes with normal activities or awakens from sleep    - SEVERE (8-10): excruciating pain, unable to do any normal activities      moderate 4. URI SYMPTOMS: " Do you have a runny nose or cough?"     Runny nose, sore throat 5. FEVER: "Do you have a fever?" If so, ask: "What is your temperature, how was it measured, and when did it start?"     pt has not taken 6. CAUSE: "Have you been swimming recently?", "How often do you use Q-TIPS?", "Have you had any recent air travel or scuba diving?"     no 7. OTHER SYMPTOMS: "Do you have any other symptoms?" (e.g., headache, stiff neck, dizziness, vomiting, runny nose, decreased hearing)     Headache, runny nose, chest tightness  8. PREGNANCY: "Is there any chance you are pregnant?" "When was your last menstrual period?"     No, no  Protocols used: EARACHE-A-AH

## 2018-10-28 NOTE — Telephone Encounter (Signed)
Dr. [name] please advise if you feel she should go ahead with these appts or should she hold off until all of this passes with the virus.  Thanks

## 2018-10-28 NOTE — Telephone Encounter (Signed)
There is no rush on the hepatitis C test, we can just add it the next time she has blood drawn. As for the appointments, yes I would advise her to go to them as she normally would

## 2018-11-05 ENCOUNTER — Other Ambulatory Visit: Payer: Self-pay

## 2018-11-06 ENCOUNTER — Other Ambulatory Visit: Payer: Self-pay

## 2018-11-06 ENCOUNTER — Encounter: Payer: Self-pay | Admitting: Family Medicine

## 2018-11-06 ENCOUNTER — Ambulatory Visit (INDEPENDENT_AMBULATORY_CARE_PROVIDER_SITE_OTHER): Payer: 59 | Admitting: Internal Medicine

## 2018-11-06 ENCOUNTER — Encounter: Payer: Self-pay | Admitting: Internal Medicine

## 2018-11-06 DIAGNOSIS — Z6831 Body mass index (BMI) 31.0-31.9, adult: Secondary | ICD-10-CM | POA: Diagnosis not present

## 2018-11-06 DIAGNOSIS — E039 Hypothyroidism, unspecified: Secondary | ICD-10-CM

## 2018-11-06 DIAGNOSIS — E669 Obesity, unspecified: Secondary | ICD-10-CM | POA: Diagnosis not present

## 2018-11-06 MED ORDER — LEVOTHYROXINE SODIUM 100 MCG PO TABS: 100.0000 ug | ORAL_TABLET | Freq: Every day | ORAL | 3 refills | Status: DC

## 2018-11-06 NOTE — Progress Notes (Signed)
Virtual Visit via Video Note  I connected with [facility] on 11/06/18 at 11:10 AM EDT by a video enabled telemedicine application and verified that I am speaking with the correct person using two identifiers.   I discussed the limitations of evaluation and management by telemedicine and the availability of in person appointments. The patient expressed understanding and agreed to proceed.  -Location of the patient :Home -Location of the provider : Office  -The names of all persons participating in the telemedicine service : [place]      Name: Tracy Brown  MRN/ DOB: [id number], 10-Aug-1961    Age/ Sex: 58 y.o., female    PCP: Laurey Morale, MD   Reason for Endocrinology Evaluation: Hypothyroidism     Date of Initial Endocrinology Evaluation: 11/06/2018     HPI: [name] is a 58 y.o. female with a past medical history of Hypothyroidism and allergic rhinitis. The patient presented for initial endocrinology clinic visit on 11/06/2018 for consultative assistance with her Hypothyroidism.   She has been diagnosed with hypothyroidism in 04/2017 after she presented with weight gain and fatigue at the time and found to have a TSH of 12.30 uIU/mL . She was initially started on Levothyroxine 75 mcg daily, this has been subsequently increased to the current dose of 100 mcg daily   Pt did notice transient improvement on LT-4 replacement but she is back to feeling tired again.  She has chronic dry skin and does not have any constipation.   Pt is concerned about her consistent weight gain despite being on a diet.   Pt used to work out at [employer], but she has never used a calorie counting app.   She has had abdominal striae since having her kids but believes there may have been some increase in the amount of striae but no change in color.   She denies biotin intake.    Mother with thyroid disease.    HISTORY:  Past Medical History:  Past Medical History:  Diagnosis  Date  . Allergy   . Anemia   . Chickenpox   . Colon polyps   . Diverticulosis   . Heart murmur   . History of recurrent UTIs   . Hyperlipidemia   . Hypertension   . Nephrolithiasis   . OA (osteoarthritis)   . Serrated adenoma of colon 02/2017    Past Surgical History:  Past Surgical History:  Procedure Laterality Date  . APPENDECTOMY    . BREAST CYST ASPIRATION Right   . COLON SURGERY  August 2012   right colectomy, per Dr. Judeth Horn, for tubular  adenoma   . COLONOSCOPY  02/28/2017   per Dr. Collene Mares, sessile serrated polyps, repeat in 5 years   . HEMORRHOID SURGERY    . HERNIA REPAIR    . 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 TONSILLECTOMY        Social History:  reports that she quit smoking about 2 years ago. She has a 37.00 pack-year smoking history. She has never used smokeless tobacco. She reports current alcohol use. She reports that she does not use drugs.  Family History: family history includes Breast cancer in her cousin and maternal grandfather; Cancer in an other family member; Coronary artery disease in an other family member; Hypertension in an other family member; Leukemia in her father; Stroke in an other family member.   HOME MEDICATIONS: Allergies as of  11/06/2018      Reactions   Hydrocodone    Phentermine Itching   Zithromax [azithromycin Dihydrate]       Medication List       Accurate as of November 06, 2018 11:09 AM. Always use your most recent med list.        aspirin 81 MG chewable tablet Chew 81 mg by mouth daily.   calcium carbonate 1500 (600 Ca) MG Tabs tablet Commonly known as:  OSCAL Take 800 tablets by mouth daily with breakfast.   cetirizine 10 MG tablet Commonly known as:  ZYRTEC Take 10 mg by mouth daily.   ibuprofen 200 MG tablet Commonly known as:  ADVIL,MOTRIN Take 200 mg by mouth every 6 (six) hours as needed.   MULTIPLE VITAMINS PO Take by mouth.   Synthroid 100 MCG tablet  Generic drug:  levothyroxine Take 1 tablet (100 mcg total) by mouth daily before breakfast.   vitamin C 1000 MG tablet Take 1,000 mg by mouth daily.         REVIEW OF SYSTEMS: A comprehensive ROS was conducted with the patient and is negative except as per HPI and below:  Review of Systems  Constitutional: Positive for malaise/fatigue and weight loss.  HENT: Negative for congestion and sore throat.   Eyes: Positive for blurred vision. Negative for pain.  Respiratory: Negative for cough and shortness of breath.   Cardiovascular: Negative for chest pain and palpitations.  Gastrointestinal: Positive for diarrhea. Negative for nausea.  Genitourinary: Negative for frequency.  Skin:       Dry skin  Neurological: Negative for tingling and tremors.  Endo/Heme/Allergies: Negative for polydipsia.  Psychiatric/Behavioral: Negative for depression. The patient is not nervous/anxious.       DATA REVIEWED: Results for AUBRIELLA, PEREZGARCIA (MRN [id number]) as of 11/06/2018 14:31  Ref. Range 10/12/2018 16:14  TSH Latest Ref Range: 0.35 - 4.50 uIU/mL 0.75  Triiodothyronine,Free,Serum Latest Ref Range: 2.3 - 4.2 pg/mL 3.6  T4,Free(Direct) Latest Ref Range: 0.60 - 1.60 ng/dL 0.94      ASSESSMENT/PLAN/RECOMMENDATIONS:   1. Hypothyroidism :  - Pt is bio-chemically euthyroid  - Her weight gain is not related to her thyroid at this time. - I would recommend against increasing her dose.  We have discussed the risks of the presence of too much LT-4 replacement on boar with increasing risk of osteoporosis, bone fractures and cardiac arrhythmia.  - - Pt educated extensively on the correct way to take levothyroxine (first thing in the morning with water, 30 minutes before eating or taking other medications). - Pt encouraged to double dose the following day if she were to miss a dose given long half-life of levothyroxine.   Medications : Levothyroxine 100 mcg daily   2. Obesity (BMI 31.45 kg/m2)  - I  have encouraged her to return to regular exercise again , we discussed the importance of moderate intensity exercise - I have also encouraged her to start using a calorie counting app (e.g myfitnesspal" - Pt may seek weight management with Dr. Leafy Ro  - We also discussed D/D of unexplained weight gain in screening of cushing syndrome with dexamethasone suppression test.   I discussed the assessment and treatment plan with the patient. The patient was provided an opportunity to ask questions and all were answered. The patient agreed with the plan and demonstrated an understanding of the instructions.   The patient was advised to call back or seek an in-person evaluation if the symptoms worsen or if the  condition fails to improve as anticipated.  I provided 30 minutes of non-face-to-face time during this encounter.  F/u in 3 months     Signed electronically by: Mack Guise, MD  [facility]  [address] [place], [place] [address] Phone: [phone] FAX: [phone]   CC: Laurey Morale, [place] [address] Phone: [phone] Fax: [phone]   Return to Endocrinology clinic as below: Future Appointments  Date Time Provider [place]  11/06/2018 11:10 AM Shamleffer, Melanie Crazier, MD [facility] None

## 2018-11-10 ENCOUNTER — Encounter: Payer: Self-pay | Admitting: Family Medicine

## 2018-11-10 DIAGNOSIS — R635 Abnormal weight gain: Secondary | ICD-10-CM

## 2018-11-10 NOTE — Telephone Encounter (Signed)
Dr. [name] please advise on the referral to Dr. Leafy Ro.  Thanks

## 2018-11-10 NOTE — Telephone Encounter (Signed)
Tell her that, yes these could be symptoms of Covid-19, but it is hard to tell. There is no way to test her however. I suggest she self quarantine for 7 days (stay away from her daughter).

## 2018-11-10 NOTE — Telephone Encounter (Signed)
I referred her to Dr. Leafy Ro

## 2018-11-10 NOTE — Telephone Encounter (Signed)
Dr. [name] I was trying to schedule the pt for a virtual visit with you for these issues.  She stated that she has a high deductible and wanted to see if this could be addressed via this message.  Thanks

## 2018-11-27 ENCOUNTER — Telehealth: Payer: Self-pay | Admitting: Family Medicine

## 2018-11-27 NOTE — Telephone Encounter (Signed)
Pt was calling to see if she could get a Covid -19 test. Pt stated she heard [facility] did not have any tests, "even in the ED." Pt frustrated that she has symptoms and wants to know that she is Covid negative so she can watch her grandchildren because her daughter is going to have a baby. Informed pt that if a pt gets severe symptoms they go to the ED. It is up to the provider if the pt gets tested and they do have testing for ED patients who present with severe symptoms.  Advised pt to continue to quarantine and monitor for any worsening symptoms.

## 2018-12-25 ENCOUNTER — Other Ambulatory Visit: Payer: Self-pay | Admitting: Family Medicine

## 2018-12-28 ENCOUNTER — Ambulatory Visit: Payer: Self-pay | Admitting: *Deleted

## 2018-12-28 NOTE — Telephone Encounter (Signed)
Pt had a question regarding the symptoms of the covid-19. She stated that she has a scratchy throat,not bad. She had been in [place] last week at the hospital with her daughter having a baby. The baby had complications and they had to be there for 98 nights. She tried to stay in the room as much as possible but she saw employees walking around without masks or not wearing them properly. Not the nurses or doctors. She denies fever, body aches, shortness of breath, cough or chest tightness. She did have some diarrhea but states that is not new. Advised to monitor her symptoms and if she starts feeling worst to call back. Pt voiced understanding, but would like a call back from Dr. [name] today. She is planning on taking her elderly parents to the beach and wanted to make sure that she is ok. Routing to flow at [facility] for review. No protocol used.

## 2018-12-29 ENCOUNTER — Encounter: Payer: Self-pay | Admitting: *Deleted

## 2018-12-29 NOTE — Telephone Encounter (Signed)
Tracy Brown could you address in Dr. Barbie Banner absence.  Thanks

## 2018-12-30 ENCOUNTER — Encounter (INDEPENDENT_AMBULATORY_CARE_PROVIDER_SITE_OTHER): Payer: Self-pay | Admitting: Family Medicine

## 2018-12-30 ENCOUNTER — Other Ambulatory Visit: Payer: Self-pay

## 2018-12-30 ENCOUNTER — Ambulatory Visit (INDEPENDENT_AMBULATORY_CARE_PROVIDER_SITE_OTHER): Payer: 59 | Admitting: Family Medicine

## 2018-12-30 VITALS — BP 131/85 | HR 72 | Temp 97.9°F | Ht 61.0 in | Wt 168.0 lb

## 2018-12-30 DIAGNOSIS — Z1331 Encounter for screening for depression: Secondary | ICD-10-CM

## 2018-12-30 DIAGNOSIS — R0602 Shortness of breath: Secondary | ICD-10-CM

## 2018-12-30 DIAGNOSIS — E559 Vitamin D deficiency, unspecified: Secondary | ICD-10-CM

## 2018-12-30 DIAGNOSIS — Z0289 Encounter for other administrative examinations: Secondary | ICD-10-CM

## 2018-12-30 DIAGNOSIS — Z9189 Other specified personal risk factors, not elsewhere classified: Secondary | ICD-10-CM | POA: Diagnosis not present

## 2018-12-30 DIAGNOSIS — Z6831 Body mass index (BMI) 31.0-31.9, adult: Secondary | ICD-10-CM

## 2018-12-30 DIAGNOSIS — E038 Other specified hypothyroidism: Secondary | ICD-10-CM | POA: Diagnosis not present

## 2018-12-30 DIAGNOSIS — R5383 Other fatigue: Secondary | ICD-10-CM | POA: Diagnosis not present

## 2018-12-30 DIAGNOSIS — E669 Obesity, unspecified: Secondary | ICD-10-CM

## 2018-12-30 NOTE — Progress Notes (Deleted)
Office: [phone]  /  Fax: [phone]    Date:***   Appointment Start Time:*** Duration:*** Provider: Glennie Isle, Psy.D. Type of Session: Intake for Individual Therapy  Location of Patient: *** Location of Provider: {Location of Service:22491} Type of Contact: Telepsychological Visit via [employer] WebEx***  Informed Consent: Prior to proceeding with today's appointment, two pieces of identifying information were obtained from Fanshawe to verify identity. In addition, Atziry's physical location at the time of this appointment was obtained. Jolyn reported she was at *** and provided the address. In the event of technical difficulties, [name] shared a phone number she could be reached at. Tyrene and this provider participated in today's telepsychological service. Also, Darilyn denied anyone else being present in the room or on the WebEx appointment***.   The provider's role was explained to [employer]. The provider reviewed and discussed issues of confidentiality, privacy, and limits therein. In addition to verbal informed consent, written informed consent for psychological services was obtained from [place]well prior to the initial intake interview. Written consent included information concerning the practice, financial arrangements, and confidentiality and patients' rights. Since the clinic is not a 24/7 crisis center, mental health emergency resources were shared, and the provider explained MyChart, e-mail, voicemail, and/or other messaging systems should be utilized only for non-emergency reasons. Remmie verbally acknowledged understanding of the aforementioned, and agreed to use mental health emergency resources discussed if needed. Moreover, Elvira agreed information may be shared with other [facility] providers as needed for coordination of care. Written consent will be obtained.   Prior to initiating telepsychological services, Velina was provided with an informed consent  document, which included the development of a safety plan (i.e., an emergency contact and emergency resources) in the event of an emergency/crisis. Zyann returned the completed consent form prior to today's appointment. This provider verbally reviewed the consent form during today's appointment prior to proceeding with the appointment. Aliscia verbally acknowledged understanding that she is ultimately responsible for understanding her insurance benefits as it relates to reimbursement of telepsychological services. This provider also reviewed confidentiality, as it relates to telepsychological services, as well as the rationale for telepsychological services. More specifically, this provider's clinic is closed for in-person visits due to COVID-19. Therapeutic services will resume to in-person appointments once the clinic re-opens. Shaleka expressed understanding regarding the rationale for telepsychological services. In addition, this provider explained the telepsychological services informed consent document would be considered an addendum to the initial consent document. She verbally consented to proceed.   Chief Complaint/HPI: Anaelle was referred by {gbproviders:21756} due to {Reason for Referrals:22136}. Per the note for the visit with {gbproviders:21756} on ***, ***. During the initial appointment with {gbproviders:21756} at [facility], Goldie reported experiencing the following: {HPI Sxs:22137}.   During today's appointment, Annett reported *** Furthermore, Crystalle was verbally administered a questionnaire assessing various behaviors related to emotional eating. Johannah endorsed the following: {gbmoodandfood:21755}.  Mental Status Examination:  Appearance: {Appearance:22431} Behavior: {Behavior:22445} Mood: {Teletherapy mood:22435} Affect: {Affect:22436} Speech: {Speech:22432} Eye Contact: {Eye Contact:22433} Psychomotor Activity: {Motor Activity:22434} Thought Process: {thought process:22448}   Content/Perceptual Disturbances: {disturbances:22451} Orientation: {Orientation:22437} Cognition/Sensorium: {gbcognition:22449} Insight: {Insight:22446} Judgment: {Insight:22446}  Family & Psychosocial History: Babe reported she is ***. She indicated she is currently ***. Additionally, Tyrianna shared her highest level of education obtained is ***. Currently, Rosaleen's social support system consists of ***. Moreover, Maneh stated she resides with ***.   Medical History: ***  Mental Health History: ***  Davian denied a trauma history, including {  gbtrauma:22071} abuse, as well as neglect. ***  Kelisha reported experiencing the following: {gbintakesxs:21966}  Landon reported experiencing worry thoughts regarding the following:   Gabryel denied experiencing the following: {gbsxs:21965}  She also denied history of and current suicidal ideation, plan, and intent; history of and current homicidal ideation, plan, and intent; and history of and current engagement in self-harm.  The following strengths were reported by [facility]:*** The following strengths were observed by this provider: {gbstrengths:22223}.  Legal History: Iliyana {gblegal:22371} a history of legal involvement.   Structured Assessment Results: The Patient Health Questionnaire-9 (PHQ-9) is a self-report measure that assesses symptoms and severity of depression over the course of the last two weeks. Niyla obtained a score of *** suggesting {GBPHQ9SEVERITY:21752}. Haely finds the endorsed symptoms to be {gbphq9difficulty:21754}. Decreased interest ***  Down, depressed, hopeless ***  Altered sleeping ***  Tired, decreased energy ***  Change in appetite ***  Feeling bad or failure about yourself ***  Trouble concentrating ***  Moving slowly or fidgety/restless ***  Suicidal thoughts ***  PHQ-9 Score ***    The Generalized Anxiety Disorder-7 (GAD-7) is a brief self-report measure that assesses symptoms of anxiety over the course of the  last two weeks. Lile obtained a score of *** suggesting {gbgad7severity:21753}. Marely finds the endorsed symptoms to be {gbphq9difficulty:21754}. Nervous, anxious, on edge ***  Control/stop worrying ***  Worrying too much- different things ***  Trouble relaxing ***  Restless ***  Easily annoyed or irritable ***  Afraid-awful might happen ***  GAD-7 Score ***   Interventions: A chart review was conducted prior to the clinical intake interview. The PHQ-9, and GAD-7 were verbally *** administered as well as a Mood and Food questionnaire to assess various behaviors related to emotional eating. Throughout session, empathic reflections and validation was provided. Continuing treatment with this provider was discussed and a treatment goal was established. Psychoeducation regarding emotional versus physical hunger was provided. Tiarrah was sent a handout via a MyChart message*** to utilize between now and the next appointment to increase awareness of hunger patterns and subsequent eating. Prior to sending the Green message, this provider explained the message would be visible to all providers, as it would be part of the electronic medical record. Natascha verbally acknowledged understanding, and verbally consented to this provider sending the MyChart message***.  Provisional DSM-5 Diagnosis: ***  Plan: Ayen appears able and willing to participate as evidenced by collaboration on a treatment goal, engagement in reciprocal conversation, and asking questions as needed for clarification. The next appointment will be scheduled in {gbweeks:21758}, which will be via [employer]. The following treatment goal was established: {gbtxgoals:21759}. Once this provider's office resumes in-person appointments, Lynsey will be notified. For the aforementioned goal, Tenelle can benefit from biweekly individual therapy sessions that are brief in duration for approximately four to six sessions. The treatment modality will be  individual therapeutic services, including an eclectic therapeutic approach utilizing techniques from Cognitive Behavioral Therapy, Patient Centered Therapy, Dialectical Behavior Therapy, Acceptance and Commitment Therapy, Interpersonal Therapy, and Cognitive Restructuring. Therapeutic approach will include various interventions as appropriate, such as validation, support, mindfulness, thought defusion, reframing, psychoeducation, values assessment, and role playing. This provider will regularly review the treatment plan and medical chart to keep informed of status changes. Lashanta expressed understanding and agreement with the initial treatment plan of care.

## 2018-12-31 LAB — INSULIN, RANDOM: INSULIN: 16.7 u[IU]/mL (ref 2.6–24.9)

## 2018-12-31 LAB — FOLATE: Folate: 13 ng/mL (ref >3.0)

## 2018-12-31 LAB — LIPID PANEL WITH LDL/HDL RATIO
Cholesterol, Total: 204 mg/dL — ABNORMAL HIGH (ref 100–199)
HDL: 67 mg/dL (ref >39)
LDL Calculated: 121 mg/dL — ABNORMAL HIGH (ref 0–99)
LDl/HDL Ratio: 1.8 ratio (ref 0.0–3.2)
Triglycerides: 80 mg/dL (ref 0–149)
VLDL Cholesterol Cal: 16 mg/dL (ref 5–40)

## 2018-12-31 LAB — VITAMIN B12: Vitamin B-12: 468 pg/mL (ref 232–1245)

## 2018-12-31 LAB — HEMOGLOBIN A1C
Est. average glucose Bld gHb Est-mCnc: 108 mg/dL
Hgb A1c MFr Bld: 5.4 % (ref 4.8–5.6)

## 2018-12-31 NOTE — Progress Notes (Signed)
Office: [phone]  /  Fax: [phone]   Dear Dr. Sarajane Brown,   Thank you for referring Tracy Brown to our clinic. The following note includes my evaluation and treatment recommendations.  HPI:   Chief Complaint: OBESITY    Tracy Brown has been referred by Tracy Brown. Tracy Jews, MD for consultation regarding her obesity and obesity related comorbidities.    Tracy Brown (MR# [id number]) is a 58 y.o. female who presents on 12/30/2018 for obesity evaluation and treatment. Current BMI is Body mass index is 31.74 kg/m. Tracy Brown has been struggling with her weight for many years and has been unsuccessful in either losing weight, maintaining weight loss, or reaching her healthy weight goal.     Tracy Brown has a history of R colectomy in 2012, and she notes occasional diarrhea. She is eating breakfast later around 10 am, snacking starting around 2 pm until the end of her work day, then dinner is meat, veggies and occasional starch.     Tracy Brown attended our information session and states she is currently in the action stage of change and ready to dedicate time achieving and maintaining a healthier weight. Tracy Brown is interested in becoming our Tracy Brown and working on intensive lifestyle modifications including (but not limited to) diet, exercise and weight loss.    Tracy Brown states her family eats meals together she thinks her family will eat healthier with  her her desired weight loss is 48-53 lbs she started gaining weight after thyroid issues her heaviest weight ever was 170 lbs she has significant food cravings issues  she snacks frequently in the evenings she skips meals frequently she is frequently drinking liquids with calories she frequently makes poor food choices she has problems with excessive hunger  she struggles with emotional eating    Tracy Brown feels her energy is lower than it should be. This has worsened with weight gain and has not worsened recently. Tracy Brown admits to daytime somnolence and   admits to waking up still tired. Tracy Brown is at risk for obstructive sleep apnea. Patent has a history of symptoms of daytime Tracy and morning headache. Tracy Brown generally gets 6 hours of sleep per night, and states they generally have nightime awakenings. Snoring is present. Apneic episodes are not present. Epworth Sleepiness Score is 7.  Dyspnea on exertion Tracy Brown notes increasing shortness of breath with exercising and seems to be worsening over time with weight gain. She notes getting out of breath sooner with activity than she used to. This has not gotten worse recently. EKG-normal sinus rhythm at 76 BPM. Tracy Brown denies orthopnea.  Vitamin D Deficiency Tracy Brown has a diagnosis of vitamin D deficiency. She is on calcium and Vit D supplementation. Her recent Vit D level was 35.59. She notes Tracy and denies nausea, vomiting or muscle weakness.  At risk for osteopenia and osteoporosis Tracy Brown is at higher risk of osteopenia and osteoporosis due to vitamin D deficiency.   Hypothyroidism Tracy Brown has a diagnosis of hypothyroidism. She is currently on Synthroid. Her recent thyroid panel was within normal limits. She denies hot or cold intolerance or palpitations, but does admit to ongoing Tracy.  Depression Screen Tracy Brown's Food and Mood (modified PHQ-9) score was No flowsheet data found.  ASSESSMENT AND PLAN:  Other Tracy - Plan: EKG 12-Lead, Hemoglobin A1c, Insulin, random, Vitamin B12, Folate  Shortness of breath on exertion - Plan: Lipid Panel With LDL/HDL Ratio  Vitamin D deficiency  Other specified hypothyroidism  Depression screening  At risk for osteoporosis  Class  1 obesity with serious comorbidity and body mass index (BMI) of 31.0 to 31.9 in adult, unspecified obesity type  PLAN:  Tracy Brown was informed that her Tracy may be related to obesity, depression or many other causes. Labs will be ordered, and in the meanwhile Tracy Brown has agreed to work on diet, exercise and weight  loss to help with Tracy. Proper sleep hygiene was discussed including the need for 7-8 hours of quality sleep each night. A sleep study was not ordered based on symptoms and Epworth score.  Dyspnea on exertion Tracy Brown's shortness of breath appears to be obesity related and exercise induced. She has agreed to work on weight loss and gradually increase exercise to treat her exercise induced shortness of breath. If Tracy Brown follows our instructions and loses weight without improvement of her shortness of breath, we will plan to refer to pulmonology. We will monitor this condition regularly. Tracy Brown agrees to this plan.  Vitamin D Deficiency Tracy Brown was informed that low vitamin D levels contributes to Tracy and are associated with obesity, breast, and colon cancer. Tracy Brown agrees to continue taking OTC Vit D and will follow up for routine testing of vitamin D, at least 2-3 times per year. She was informed of the risk of over-replacement of vitamin D and agrees to not increase her dose unless she discusses this with [name] first. Tracy Brown agrees to follow up with our clinic in 2 weeks.  At risk for osteopenia and osteoporosis Tracy Brown was given extended (15 minutes) osteoporosis prevention counseling today. Tracy Brown is at risk for osteopenia and osteoporsis due to her vitamin D deficiency. She was encouraged to take her vitamin D and follow her higher calcium diet and increase strengthening exercise to help strengthen her bones and decrease her risk of osteopenia and osteoporosis.  Hypothyroidism Tracy Brown was informed of the importance of good thyroid control to help with weight loss efforts. She was also informed that supertheraputic thyroid levels are dangerous and will not improve weight loss results. Tracy Brown agrees to continue taking Synthroid, and she agrees to follow up with our clinic in 2 weeks.  Depression Screen Tracy Brown had a mildly positive depression screening. Depression is commonly associated with obesity and often  results in emotional eating behaviors. We will monitor this closely and work on CBT to help improve the non-hunger eating patterns. Referral to Psychology may be required if no improvement is seen as she continues in our clinic.  Obesity Tracy Brown is currently in the action stage of change and her goal is to continue with weight loss efforts. I recommend Tracy Brown begin the structured treatment plan as follows:  She has agreed to follow the Category 1 plan + 100 calories Tracy Brown has been instructed to eventually work up to a goal of 150 minutes of combined cardio and strengthening exercise per week for weight loss and overall health benefits. We discussed the following Behavioral Modification Strategies today: increasing lean protein intake, increasing vegetables and work on meal planning and easy cooking plans, keeping healthy foods in the home, and planning for success   She was informed of the importance of frequent follow up visits to maximize her success with intensive lifestyle modifications for her multiple health conditions. She was informed we would discuss her lab results at her next visit unless there is a critical issue that needs to be addressed sooner. Tracy Brown agreed to keep her next visit at the agreed upon time to discuss these results.  ALLERGIES: Allergies  Allergen Reactions  . Hydrocodone   .  Phentermine Itching  . Zithromax [Azithromycin Dihydrate]     MEDICATIONS: Current Outpatient Medications on File Prior to Visit  Medication Sig Dispense Refill  . Ascorbic Acid (VITAMIN C) 1000 MG tablet Take 1,000 mg by mouth daily.    Marland Kitchen aspirin 81 MG chewable tablet Chew 81 mg by mouth daily.    . calcium carbonate (OSCAL) 1500 (600 Ca) MG TABS tablet Take 800 tablets by mouth daily with breakfast.     . cetirizine (ZYRTEC) 10 MG tablet Take 10 mg by mouth daily.    Marland Kitchen ibuprofen (ADVIL,MOTRIN) 200 MG tablet Take 200 mg by mouth every 6 (six) hours as needed.    Marland Kitchen levothyroxine (SYNTHROID,  LEVOTHROID) 100 MCG tablet Take 1 tablet (100 mcg total) by mouth daily. 90 tablet 3  . MULTIPLE VITAMINS PO Take by mouth.     No current facility-administered medications on file prior to visit.     PAST MEDICAL HISTORY: Past Medical History:  Diagnosis Date  . Allergy   . Anemia   . Chickenpox   . Colon polyps   . Diverticulosis   . Heart murmur   . History of recurrent UTIs   . Hyperlipidemia   . Hypertension   . Hypothyroid   . Nephrolithiasis   . OA (osteoarthritis)   . Serrated adenoma of colon 02/2017    PAST SURGICAL HISTORY: Past Surgical History:  Procedure Laterality Date  . APPENDECTOMY    . BREAST CYST ASPIRATION Right   . COLON SURGERY  August 2012   right colectomy, per Dr. Judeth Horn, for tubular  adenoma   . COLONOSCOPY  02/28/2017   per Dr. Collene Mares, sessile serrated polyps, repeat in 5 years   . HEMORRHOID SURGERY    . HERNIA REPAIR    . 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 TONSILLECTOMY      SOCIAL HISTORY: Social History   Tobacco Use  . Smoking status: Former Smoker    Packs/day: 1.00    Years: 37.00    Pack years: 37.00    Last attempt to quit: 10/10/2016    Years since quitting: 2.2  . Smokeless tobacco: Never Used  Substance Use Topics  . Alcohol use: Yes    Alcohol/week: 0.0 standard drinks    Comment: once a month  . Drug use: No    FAMILY HISTORY: Family History  Problem Relation Age of Onset  . Cancer Other        breast,colon,ovarian  . Coronary artery disease Other   . Hypertension Other   . Stroke Other   . Leukemia Father   . Cancer Father   . Breast cancer Maternal Grandfather   . Breast cancer Cousin   . Thyroid disease Mother   . Hypertension Mother   . Stroke Mother     ROS: Review of Systems  Constitutional: Positive for malaise/Tracy. Negative for weight loss.       + Trouble sleeping  HENT:       + Decreased hearing  Eyes:       + Vision changes   Respiratory: Positive for shortness of breath (with exertion).   Cardiovascular: Negative for palpitations and orthopnea.  Gastrointestinal: Positive for diarrhea. Negative for nausea and vomiting.  Musculoskeletal:       Negative muscle weakness + Muscle or joint pain  Skin: Positive for rash.  Neurological: Positive for headaches.  Endo/Heme/Allergies:       Negative hot/cold intolerance  PHYSICAL EXAM: Blood pressure 131/85, pulse 72, temperature 97.9 F (36.6 C), temperature source Oral, height 5\' 1"  (1.549 m), weight 168 lb (76.2 kg), SpO2 99 %. Body mass index is 31.74 kg/m. Physical Exam Vitals signs reviewed.  Constitutional:      Appearance: Normal appearance. She is obese.  HENT:     Head: Normocephalic and atraumatic.  Eyes:     General: No scleral icterus.    Extraocular Movements: Extraocular movements intact.  Neck:     Musculoskeletal: Normal range of motion and neck supple.     Comments: No thyromegaly present Cardiovascular:     Rate and Rhythm: Normal rate and regular rhythm.     Pulses: Normal pulses.     Heart sounds: Normal heart sounds.  Pulmonary:     Effort: Pulmonary effort is normal. No respiratory distress.     Breath sounds: Normal breath sounds.  Abdominal:     Palpations: Abdomen is soft.     Tenderness: There is no abdominal tenderness.     Comments: + Obesity  Musculoskeletal: Normal range of motion.     Right lower leg: No edema.     Left lower leg: No edema.  Skin:    General: Skin is warm and dry.  Neurological:     Mental Status: She is alert and oriented to person, place, and time.     Coordination: Coordination normal.  Psychiatric:        Mood and Affect: Mood normal.        Behavior: Behavior normal.     RECENT LABS AND TESTS: BMET    Component Value Date/Time   NA 140 10/12/2018 1614   K 4.1 10/12/2018 1614   CL 105 10/12/2018 1614   CO2 25 10/12/2018 1614   GLUCOSE 89 10/12/2018 1614   BUN 19 10/12/2018 1614    CREATININE 0.77 10/12/2018 1614   CALCIUM 9.8 10/12/2018 1614   GFRNONAA >60 03/09/2011 0600   GFRAA >60 03/09/2011 0600   No results found for: HGBA1C No results found for: INSULIN CBC    Component Value Date/Time   WBC 7.9 10/12/2018 1614   RBC 4.81 10/12/2018 1614   HGB 13.7 10/12/2018 1614   HCT 41.1 10/12/2018 1614   PLT 270.0 10/12/2018 1614   MCV 85.3 10/12/2018 1614   MCH 29.4 03/09/2011 0600   MCHC 33.4 10/12/2018 1614   RDW 13.8 10/12/2018 1614   LYMPHSABS 3.0 10/12/2018 1614   MONOABS 0.6 10/12/2018 1614   EOSABS 0.1 10/12/2018 1614   BASOSABS 0.1 10/12/2018 1614   Iron/TIBC/Ferritin/ %Sat No results found for: IRON, TIBC, FERRITIN, IRONPCTSAT Lipid Panel     Component Value Date/Time   CHOL 230 (H) 02/06/2018 1056   TRIG 86.0 02/06/2018 1056   HDL 66.70 02/06/2018 1056   CHOLHDL 3 02/06/2018 1056   VLDL 17.2 02/06/2018 1056   LDLCALC 146 (H) 02/06/2018 1056   Hepatic Function Panel     Component Value Date/Time   PROT 7.1 10/12/2018 1614   ALBUMIN 4.6 10/12/2018 1614   AST 15 10/12/2018 1614   ALT 19 10/12/2018 1614   ALKPHOS 70 10/12/2018 1614   BILITOT 0.3 10/12/2018 1614   BILIDIR 0.1 10/12/2018 1614      Component Value Date/Time   TSH 0.75 10/12/2018 1614   TSH 0.27 (L) 05/13/2018 1550   TSH 2.43 02/06/2018 1056    ECG  shows NSR with a rate of 76 BPM INDIRECT CALORIMETER done today shows a VO2 of 175  and a REE of 1218.  Her calculated basal metabolic rate is 7858 thus her basal metabolic rate is worse than expected.       OBESITY BEHAVIORAL INTERVENTION VISIT  Today's visit was # 1   Starting weight: 168 lbs Starting date: 12/30/2018 Today's weight : 168 lbs  Today's date: 12/30/2018 Total lbs lost to date: 0    ASK: We discussed the diagnosis of obesity with Tracy Brown today and Tracy Brown agreed to give [name] permission to discuss obesity behavioral modification therapy today.  ASSESS: Ronette has the diagnosis of obesity and  her BMI today is 31.76 Hasna is in the action stage of change   ADVISE: Mariangel was educated on the multiple health risks of obesity as well as the benefit of weight loss to improve her health. She was advised of the need for long term treatment and the importance of lifestyle modifications to improve her current health and to decrease her risk of future health problems.  AGREE: Multiple dietary modification options and treatment options were discussed and  Michole agreed to follow the recommendations documented in the above note.  ARRANGE: Kynisha was educated on the importance of frequent visits to treat obesity as outlined per CMS and USPSTF guidelines and agreed to schedule her next follow up appointment today.  I, Trixie Dredge, am acting as transcriptionist for Ilene Qua, MD    I have reviewed the above documentation for accuracy and completeness, and I agree with the above. - Ilene Qua, MD

## 2019-01-01 ENCOUNTER — Ambulatory Visit: Payer: 59 | Admitting: Internal Medicine

## 2019-01-07 NOTE — Telephone Encounter (Signed)
As long as she still has no symptoms (except her baseline diarrhea) I think it would be fine to take her parents to the beach

## 2019-01-07 NOTE — Telephone Encounter (Signed)
Dr. Fry please advise. Thanks  

## 2019-01-12 NOTE — Progress Notes (Signed)
Office: [phone]  /  Fax: [phone]    Date: January 13, 2019 Appointment Start Time: 3:11pm Duration: 35 minutes Provider: Glennie Isle, Psy.D. Type of Session: Intake for Individual Therapy  Location of Patient: [facility] Location of Provider: Provider's Home Type of Contact: Telepsychological Visit via [employer] WebEx  Informed Consent: This provider called Kaidan at 3:00pm for today's Webex appointment. A HIPAA compliant voicemail requesting a call back was left. This provider also reached out to April Moore, CMA at the clinic, as Allea was to arrive to the clinic for today's [facility] appointment, as her appointment with Dr. Adair Patter is scheduled as in-person at 4:00pm. At 3:08pm, April indicated Tiwana arrived and was waiting for this provider.  As such, the e-mail with the link was re-sent. She connected at 3:11pm by calling into the [facility] call; therefore, this provider requested she download the app. While the app was being downloaded, the appointment continued and at 3:21pm, Tamieka was observed joining with video capabilities; however, audio did not work. Thus, a regular call was utilized for the audio portion and Webex was used for video capabilities.   Prior to proceeding with today's appointment, two pieces of identifying information were obtained from [place] to verify identity. In addition, Jennessy's physical location at the time of this appointment was obtained. Tyleigh reported she was at the [facility]. In the event of technical difficulties, Psalm shared a phone number she could be reached at. Kellyjo and this provider participated in today's telepsychological service. Also, Inez denied anyone else being present in the room or on the WebEx appointment.   The provider's role was explained to [employer]. The provider reviewed and discussed issues of confidentiality, privacy, and limits therein (e.g., reporting obligations). In addition to verbal  informed consent, written informed consent for psychological services was obtained from [place] prior to the initial intake interview. Written consent included information concerning the practice, financial arrangements, and confidentiality and patients' rights. Since the clinic is not a 24/7 crisis center, mental health emergency resources were shared, and the provider explained MyChart, e-mail, voicemail, and/or other messaging systems should be utilized only for non-emergency reasons. This provider also explained that information obtained during appointments will be placed in [place] record in a confidential manner and relevant information will be shared with other providers at [facility] that she meets with for coordination of care. Ausha verbally acknowledged understanding of the aforementioned, and agreed to use mental health emergency resources discussed if needed. Moreover, Jessah agreed information may be shared with other [facility] providers as needed for coordination of care. By signing the service agreement document, Jai provided written consent for coordination of care.   Prior to initiating telepsychological services, Kniyah was provided with an informed consent document, which included the development of a safety plan (i.e., an emergency contact and emergency resources) in the event of an emergency/crisis. Shawniece expressed understanding of the rationale of the safety plan and provided consent for this provider to reach out to her emergency contact in the event of an emergency/crisis.Melodi returned the completed consent form prior to today's appointment. This provider verbally reviewed the consent form during today's appointment prior to proceeding with the appointment. Elysse verbally acknowledged understanding that she is ultimately responsible for understanding her insurance benefits as it relates to reimbursement of telepsychological services. This provider also  reviewed confidentiality, as it relates to telepsychological services, as well as the rationale for telepsychological services.  More specifically, this provider's clinic is limiting in-person visits due to COVID-19. Therapeutic services will resume to in-person appointments once deemed appropriate. Connie expressed understanding regarding the rationale for telepsychological services. In addition, this provider explained the telepsychological services informed consent document would be considered an addendum to the initial consent document/service agreement. Keliyah verbally consented to proceed.   Chief Complaint/HPI: Tracy Brown was referred by Dr. Ilene Qua on Dec 30, 2018. Per the note for that initial appointment, "Tracy Brown has a history of R colectomy in 2012, and she notes occasional diarrhea. She is eating breakfast later around 10 am, snacking starting around 2 pm until the end of her work day, then dinner is meat, veggies and occasional starch." During the initial appointment with Dr. Ilene Qua,  [name] reported experiencing the following: significant food cravings issues , snacking frequently in the evenings, frequently drinking liquids with calories, frequently making poor food choices, struggling with emotional eating, skipping meals frequently and having problems with excessive hunger.   During today's appointment, Tracy Brown reported, "I started working at home and I eat out of boredom." She indicated the onset of emotional eating was likely a couple years ago when she began working from home, and she noted, "That's when I started putting this all [referring to weight] on." Furthermore, Tracy Brown was verbally administered a questionnaire assessing various behaviors related to emotional eating. Tracy Brown endorsed the following: overeat frequently when you are bored or lonely and eat as a reward. In regard to boredom eating, she noted the frequency as once a day and noted, "Someitmes I just grab carrots or  sugar snap peas." She also described grazing throughout the day. When she realizes she has engaged in emotional eating, she reported thinking, "I need to walk a mile longer" and described experiencing guilt.   Nashaly denied a history of restricting food intake, purging and engagement in other compensatory strategies, and has never been diagnosed with an eating disorder. She also has never received treatment for emotional eating and denied a history of binge eating. She shared her cravings vary, but added, "I like cheese." At this time, she denied any other problems of concern. While working from home makes her eating habits worse, Melinna explained not looking at her kitchen while working or physically being away from the kitchen helps. Overall, she believes her thyroid condition contributed to her weight gain, which has been a "struggle" and she described it as being frustrating. During the last two weeks, she acknowledged she did not follow the prescribed structured meal plan as she was on vacation.   Mental Status Examination:  Appearance: neat Behavior: cooperative Mood: euthymic Affect: mood congruent Speech: normal in rate, volume, and tone Eye Contact: appropriate Psychomotor Activity: appropriate Thought Process: linear, logical, and goal directed  Content/Perceptual Disturbances: denies suicidal and homicidal ideation, plan, and intent and no hallucinations, delusions, bizarre thinking or behavior reported or observed Orientation: time, person, place and purpose of appointment Cognition/Sensorium: memory, attention, language, and fund of knowledge intact  Insight: good Judgment: good  Family & Psychosocial History: Tracy Brown reported she is in a relationship for the past 42 years and she has one daughter (age 91). 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 Additionally, Tynisa shared her highest level of education obtained is some college. Currently,  Carlei's social support system consists of boyfriend and their mutual friends. Moreover, Kianna stated she resides with her boyfriend.   Medical History:  Past Medical History:  Diagnosis Date  Allergy    Anemia    Chickenpox    Colon polyps    Diverticulosis    Heart murmur    History of recurrent UTIs    Hyperlipidemia    Hypertension    Hypothyroid    Nephrolithiasis    OA (osteoarthritis)    Serrated adenoma of colon 02/2017   Past Surgical History:  Procedure Laterality Date   APPENDECTOMY     BREAST CYST ASPIRATION Right    COLON SURGERY  August 2012   right colectomy, per Dr. Judeth Horn, for tubular  adenoma    COLONOSCOPY  02/28/2017   per Dr. Collene Mares, sessile serrated polyps, repeat in 5 years    [place]  03-08-11   per Dr. Judeth Horn, for tubulovillous adenoma   TONSILLECTOMY     Current Outpatient Medications on File Prior to Visit  Medication Sig Dispense Refill   Ascorbic Acid (VITAMIN C) 1000 MG tablet Take 1,000 mg by mouth daily.     aspirin 81 MG chewable tablet Chew 81 mg by mouth daily.     calcium carbonate (OSCAL) 1500 (600 Ca) MG TABS tablet Take 800 tablets by mouth daily with breakfast.      cetirizine (ZYRTEC) 10 MG tablet Take 10 mg by mouth daily.     ibuprofen (ADVIL,MOTRIN) 200 MG tablet Take 200 mg by mouth every 6 (six) hours as needed.     levothyroxine (SYNTHROID, LEVOTHROID) 100 MCG tablet Take 1 tablet (100 mcg total) by mouth daily. 90 tablet 3   MULTIPLE VITAMINS PO Take by mouth.     No current facility-administered medications on file prior to visit.   Rahcel denied a history of head injuries and loss of consciousness.   Mental Health History: Juniper indicated she has never received mental health related treatment, including therapeutic services and meeting with a psychiatrist. She denied ever being prescribed psychotropic medications. In addition, Hajra denied  a history of hospitalizations for psychiatric concerns and denied family history of mental health related concerns. Moreover, Sheika denied a trauma history, including psychological, physical  and sexual abuse, as well as neglect.  Geraldean described her typical mood as "normal."  She indicated she "occasionally" consumes alcohol. This was explored and she indicated she consumes alcohol 1-2 times a month in the form of 2-3 standard drinks. Currently, she denied tobacco use and noted she used to smoke in the past. Winona denied illicit/recreational substance use. Her caffeine intake is in the form of 2 to 3 cups of coffee a day. Emmajane denied experiencing the following: hopelessness, memory concerns, obsessions and compulsions, hallucinations and delusions, paranoia, mania, worry thoughts and decreased motivation. She also denied history of and current suicidal ideation, plan, and intent; history of and current homicidal ideation, plan, and intent; and history of and current engagement in self-harm.  The following strengths were reported by Shannin: pretty outgoing, easy going, and very detailed oriented. The following strengths were observed by this provider: ability to express thoughts and feelings during the therapeutic session, ability to establish and benefit from a therapeutic relationship, ability to learn and practice coping skills, willingness to work toward established goal(s) with the clinic and ability to engage in reciprocal conversation.  Legal History: Michalene denied a history of legal involvement.   Structured Assessment Results: The Patient Health Questionnaire-9 (PHQ-9) is a self-report measure that assesses symptoms and severity of depression over the course of the last two  weeks. Jahyra obtained a score of 0. Decreased interest 0  Down, depressed, hopeless 0  Altered sleeping 0  Tired, decreased energy 0  Change in appetite 0  Feeling bad or failure about yourself 0  Trouble concentrating 0    Moving slowly or fidgety/restless 0  Suicidal thoughts 0  PHQ-9 Score 0    The Generalized Anxiety Disorder-7 (GAD-7) is a brief self-report measure that assesses symptoms of anxiety over the course of the last two weeks. Debbi obtained a score of 0. Nervous, anxious, on edge 0  Control/stop worrying 0  Worrying too much- different things 0  Trouble relaxing 0  Restless 0  Easily annoyed or irritable 0  Afraid-awful might happen 0  GAD-7 Score 0   Interventions: A chart review was conducted prior to the clinical intake interview. The PHQ-9, and GAD-7 were verbally administered as well as a Mood and Food questionnaire to assess various behaviors related to emotional eating. Throughout session, empathic reflections and validation was provided. Continuing treatment with this provider was discussed; however, Lauren declined services at this time. Nonetheless, psychoeducation regarding emotional versus physical hunger was provided. Chennel provided verbal consent during today's appointment for this provider to send the handout on emotional versus physical hunger via e-mail.   Provisional DSM-5 Diagnosis: 311 (F32.8) Other Specified Depressive Disorder, Emotional Eating Behaviors  Plan: At this time, Lauralye declined future appointments with this provider as she would like to focus on the meal plan and give it a chance, but she expressed understanding that she may call this provider's office in the future and schedule a follow-up appointment.

## 2019-01-13 ENCOUNTER — Ambulatory Visit (INDEPENDENT_AMBULATORY_CARE_PROVIDER_SITE_OTHER): Payer: 59 | Admitting: Psychology

## 2019-01-13 ENCOUNTER — Ambulatory Visit (INDEPENDENT_AMBULATORY_CARE_PROVIDER_SITE_OTHER): Payer: 59 | Admitting: Family Medicine

## 2019-01-13 ENCOUNTER — Other Ambulatory Visit: Payer: Self-pay

## 2019-01-13 ENCOUNTER — Encounter (INDEPENDENT_AMBULATORY_CARE_PROVIDER_SITE_OTHER): Payer: Self-pay | Admitting: Family Medicine

## 2019-01-13 VITALS — BP 118/82 | HR 73 | Temp 97.8°F | Ht 61.0 in | Wt 168.0 lb

## 2019-01-13 DIAGNOSIS — E8881 Metabolic syndrome: Secondary | ICD-10-CM

## 2019-01-13 DIAGNOSIS — E7849 Other hyperlipidemia: Secondary | ICD-10-CM

## 2019-01-13 DIAGNOSIS — E559 Vitamin D deficiency, unspecified: Secondary | ICD-10-CM

## 2019-01-13 DIAGNOSIS — Z9189 Other specified personal risk factors, not elsewhere classified: Secondary | ICD-10-CM

## 2019-01-13 DIAGNOSIS — E039 Hypothyroidism, unspecified: Secondary | ICD-10-CM

## 2019-01-13 DIAGNOSIS — E669 Obesity, unspecified: Secondary | ICD-10-CM

## 2019-01-13 DIAGNOSIS — Z6831 Body mass index (BMI) 31.0-31.9, adult: Secondary | ICD-10-CM

## 2019-01-13 DIAGNOSIS — F3289 Other specified depressive episodes: Secondary | ICD-10-CM

## 2019-01-14 MED ORDER — VITAMIN D (ERGOCALCIFEROL) 1.25 MG (50000 UNIT) PO CAPS: 50000.0000 [IU] | ORAL_CAPSULE | ORAL | 0 refills | Status: DC

## 2019-01-14 NOTE — Progress Notes (Signed)
Office: [phone]  /  Fax: [phone]   HPI:   Chief Complaint: OBESITY Shalina is here to discuss her progress with her obesity treatment plan. She is on the Category 1 plan + 100 calories and is not following her eating plan much. She states she is exercising 0 minutes 0 times per week. Ellise has not done the meal plan secondary to being on vacation. She does not have all of the food on the plan currently at home. she plans to go to the grocery store today.  Her weight is 168 lb (76.2 kg) today and has not lost weight since her last visit. She has lost 0 lbs since starting treatment with [name].  Hyperlipidemia Allessandra has hyperlipidemia and has been trying to improve her cholesterol levels with intensive lifestyle modification including a low saturated fat diet, exercise and weight loss. Last LDL was of 120 and HDL of 67. She denies any chest pain, claudication or myalgias.  Vitamin D Deficiency Jayli has a diagnosis of vitamin D deficiency. She is not currently taking OTC Vit D. She notes fatigue and denies nausea, vomiting or muscle weakness.  At risk for osteopenia and osteoporosis Lular is at higher risk of osteopenia and osteoporosis due to vitamin D deficiency.   Insulin Resistance Lillar has a diagnosis of insulin resistance based on her elevated fasting insulin level >5. Last Hgb A1c was of 5.4 and insulin of 16.7. Although Shahd's blood glucose readings are still under good control, insulin resistance puts her at greater risk of metabolic syndrome and diabetes. She is taking metformin currently and continues to work on diet and exercise to decrease risk of diabetes.  Hypothyroidism Zykeria has a diagnosis of hypothyroidism. She is currently on levothyroxine 100 mcg. TSH previously within normal limits. She denies hot or cold intolerance or palpitations, but does admit to ongoing fatigue.  ASSESSMENT AND PLAN:  Other hyperlipidemia  Vitamin D deficiency - Plan: Vitamin D,  Ergocalciferol, (DRISDOL) 1.25 MG (50000 UT) CAPS capsule  Insulin resistance  Hypothyroidism, unspecified type  At risk for osteoporosis  Class 1 obesity with serious comorbidity and body mass index (BMI) of 31.0 to 31.9 in adult, unspecified obesity type  PLAN:  Hyperlipidemia Jaida was informed of the American Heart Association Guidelines emphasizing intensive lifestyle modifications as the first line treatment for hyperlipidemia. We discussed many lifestyle modifications today in depth, and Camyla will continue to work on decreasing saturated fats such as fatty red meat, butter and many fried foods. She will also increase vegetables and lean protein in her diet and continue to work on exercise and weight loss efforts. We will repeat FLP and Etienne agrees to follow up with our clinic in 2 weeks.  Vitamin D Deficiency Jodell was informed that low vitamin D levels contributes to fatigue and are associated with obesity, breast, and colon cancer. Loukisha agrees to start prescription Vit D @50 ,000 IU every week #4 with no refills. She will follow up for routine testing of vitamin D, at least 2-3 times per year. She was informed of the risk of over-replacement of vitamin D and agrees to not increase her dose unless she discusses this with [name] first. Heli agrees to follow up with our clinic in 2 weeks.  At risk for osteopenia and osteoporosis Laini was given extended (30 minutes) osteoporosis prevention counseling today. Bethanny is at risk for osteopenia and osteoporsis due to her vitamin D deficiency. She was encouraged to take her vitamin D and follow her higher calcium  diet and increase strengthening exercise to help strengthen her bones and decrease her risk of osteopenia and osteoporosis.  Insulin Resistance Nikeria will continue to work on weight loss, exercise, and decreasing simple carbohydrates in her diet to help decrease the risk of diabetes. We dicussed metformin including benefits and risks.  She was informed that eating too many simple carbohydrates or too many calories at one sitting increases the likelihood of GI side effects. Emrie declined metformin for now and prescription was not written today. We will repeat labs in 3 months. Vennessa agrees to follow up with our clinic in 2 weeks as directed to monitor her progress.  Hypothyroidism Adeja was informed of the importance of good thyroid control to help with weight loss efforts. She was also informed that supertheraputic thyroid levels are dangerous and will not improve weight loss results. We will repeat thyroid panel in 3 months. Jeannetta agrees to follow up with our clinic in 2 weeks.  Obesity Rieley is currently in the action stage of change. As such, her goal is to continue with weight loss efforts She has agreed to follow the Category 1 plan + 100 calories Euline has been instructed to work up to a goal of 150 minutes of combined cardio and strengthening exercise per week for weight loss and overall health benefits. We discussed the following Behavioral Modification Strategies today: increasing lean protein intake, increasing vegetables and work on meal planning and easy cooking plans, better snacking choices, and planning for success   Lennix has agreed to follow up with our clinic in 2 weeks. She was informed of the importance of frequent follow up visits to maximize her success with intensive lifestyle modifications for her multiple health conditions.  ALLERGIES: Allergies  Allergen Reactions  . Hydrocodone   . Phentermine Itching  . Zithromax [Azithromycin Dihydrate]     MEDICATIONS: Current Outpatient Medications on File Prior to Visit  Medication Sig Dispense Refill  . Ascorbic Acid (VITAMIN C) 1000 MG tablet Take 1,000 mg by mouth daily.    Marland Kitchen aspirin 81 MG chewable tablet Chew 81 mg by mouth daily.    . calcium carbonate (OSCAL) 1500 (600 Ca) MG TABS tablet Take 800 tablets by mouth daily with breakfast.     .  cetirizine (ZYRTEC) 10 MG tablet Take 10 mg by mouth daily.    Marland Kitchen ibuprofen (ADVIL,MOTRIN) 200 MG tablet Take 200 mg by mouth every 6 (six) hours as needed.    Marland Kitchen levothyroxine (SYNTHROID, LEVOTHROID) 100 MCG tablet Take 1 tablet (100 mcg total) by mouth daily. 90 tablet 3  . MULTIPLE VITAMINS PO Take by mouth.     No current facility-administered medications on file prior to visit.     PAST MEDICAL HISTORY: Past Medical History:  Diagnosis Date  . Allergy   . Anemia   . Chickenpox   . Colon polyps   . Diverticulosis   . Heart murmur   . History of recurrent UTIs   . Hyperlipidemia   . Hypertension   . Hypothyroid   . Nephrolithiasis   . OA (osteoarthritis)   . Serrated adenoma of colon 02/2017    PAST SURGICAL HISTORY: Past Surgical History:  Procedure Laterality Date  . APPENDECTOMY    . BREAST CYST ASPIRATION Right   . COLON SURGERY  August 2012   right colectomy, per Dr. Judeth Horn, for tubular  adenoma   . COLONOSCOPY  02/28/2017   per Dr. Collene Mares, sessile serrated polyps, repeat in 5 years   .  HEMORRHOID SURGERY    . HERNIA REPAIR    . 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 TONSILLECTOMY      SOCIAL HISTORY: Social History   Tobacco Use  . Smoking status: Former Smoker    Packs/day: 1.00    Years: 37.00    Pack years: 37.00    Last attempt to quit: 10/10/2016    Years since quitting: 2.2  . Smokeless tobacco: Never Used  Substance Use Topics  . Alcohol use: Yes    Alcohol/week: 0.0 standard drinks    Comment: once a month  . Drug use: No    FAMILY HISTORY: Family History  Problem Relation Age of Onset  . Cancer Other        breast,colon,ovarian  . Coronary artery disease Other   . Hypertension Other   . Stroke Other   . Leukemia Father   . Cancer Father   . Breast cancer Maternal Grandfather   . Breast cancer Cousin   . Thyroid disease Mother   . Hypertension Mother   . Stroke Mother     ROS: Review of  Systems  Constitutional: Positive for malaise/fatigue. Negative for weight loss.  Cardiovascular: Negative for chest pain, palpitations and claudication.  Gastrointestinal: Negative for nausea and vomiting.  Musculoskeletal: Negative for myalgias.       Negative muscle weakness  Endo/Heme/Allergies:       Negative hot/cold intolerance    PHYSICAL EXAM: Blood pressure 118/82, pulse 73, temperature 97.8 F (36.6 C), temperature source Oral, height 5\' 1"  (1.549 m), weight 168 lb (76.2 kg), SpO2 97 %. Body mass index is 31.74 kg/m. Physical Exam Vitals signs reviewed.  Constitutional:      Appearance: Normal appearance. She is obese.  Cardiovascular:     Rate and Rhythm: Normal rate.     Pulses: Normal pulses.  Pulmonary:     Effort: Pulmonary effort is normal.     Breath sounds: Normal breath sounds.  Musculoskeletal: Normal range of motion.  Skin:    General: Skin is warm and dry.  Neurological:     Mental Status: She is alert and oriented to person, place, and time.  Psychiatric:        Mood and Affect: Mood normal.        Behavior: Behavior normal.     RECENT LABS AND TESTS: BMET    Component Value Date/Time   NA 140 10/12/2018 1614   K 4.1 10/12/2018 1614   CL 105 10/12/2018 1614   CO2 25 10/12/2018 1614   GLUCOSE 89 10/12/2018 1614   BUN 19 10/12/2018 1614   CREATININE 0.77 10/12/2018 1614   CALCIUM 9.8 10/12/2018 1614   GFRNONAA >60 03/09/2011 0600   GFRAA >60 03/09/2011 0600   Lab Results  Component Value Date   HGBA1C 5.4 12/30/2018   Lab Results  Component Value Date   INSULIN 16.7 12/30/2018   CBC    Component Value Date/Time   WBC 7.9 10/12/2018 1614   RBC 4.81 10/12/2018 1614   HGB 13.7 10/12/2018 1614   HCT 41.1 10/12/2018 1614   PLT 270.0 10/12/2018 1614   MCV 85.3 10/12/2018 1614   MCH 29.4 03/09/2011 0600   MCHC 33.4 10/12/2018 1614   RDW 13.8 10/12/2018 1614   LYMPHSABS 3.0 10/12/2018 1614   MONOABS 0.6 10/12/2018 1614   EOSABS  0.1 10/12/2018 1614   BASOSABS 0.1 10/12/2018 1614   Iron/TIBC/Ferritin/ %Sat No results found for:  IRON, TIBC, FERRITIN, IRONPCTSAT Lipid Panel     Component Value Date/Time   CHOL 204 (H) 12/30/2018 1210   TRIG 80 12/30/2018 1210   HDL 67 12/30/2018 1210   CHOLHDL 3 02/06/2018 1056   VLDL 17.2 02/06/2018 1056   LDLCALC 121 (H) 12/30/2018 1210   Hepatic Function Panel     Component Value Date/Time   PROT 7.1 10/12/2018 1614   ALBUMIN 4.6 10/12/2018 1614   AST 15 10/12/2018 1614   ALT 19 10/12/2018 1614   ALKPHOS 70 10/12/2018 1614   BILITOT 0.3 10/12/2018 1614   BILIDIR 0.1 10/12/2018 1614      Component Value Date/Time   TSH 0.75 10/12/2018 1614   TSH 0.27 (L) 05/13/2018 1550   TSH 2.43 02/06/2018 1056      OBESITY BEHAVIORAL INTERVENTION VISIT  Today's visit was # 2   Starting weight: 168 lbs Starting date: 12/30/2018 Today's weight : 168 lbs Today's date: 01/13/2019 Total lbs lost to date: 0    ASK: We discussed the diagnosis of obesity with Di Kindle Goeser today and Takeya agreed to give [name] permission to discuss obesity behavioral modification therapy today.  ASSESS: Landen has the diagnosis of obesity and her BMI today is 31.76 Aldina is in the action stage of change   ADVISE: Irene was educated on the multiple health risks of obesity as well as the benefit of weight loss to improve her health. She was advised of the need for long term treatment and the importance of lifestyle modifications to improve her current health and to decrease her risk of future health problems.  AGREE: Multiple dietary modification options and treatment options were discussed and  Ricquel agreed to follow the recommendations documented in the above note.  ARRANGE: Jordanne was educated on the importance of frequent visits to treat obesity as outlined per CMS and USPSTF guidelines and agreed to schedule her next follow up appointment today.  I, Trixie Dredge, am acting as  transcriptionist for Ilene Qua, MD  I have reviewed the above documentation for accuracy and completeness, and I agree with the above. - Ilene Qua, MD

## 2019-01-26 ENCOUNTER — Ambulatory Visit (INDEPENDENT_AMBULATORY_CARE_PROVIDER_SITE_OTHER): Payer: 59 | Admitting: Family Medicine

## 2019-01-28 ENCOUNTER — Other Ambulatory Visit: Payer: Self-pay

## 2019-01-28 ENCOUNTER — Ambulatory Visit (INDEPENDENT_AMBULATORY_CARE_PROVIDER_SITE_OTHER): Payer: 59 | Admitting: Family Medicine

## 2019-01-28 DIAGNOSIS — E8881 Metabolic syndrome: Secondary | ICD-10-CM | POA: Diagnosis not present

## 2019-01-28 DIAGNOSIS — E7849 Other hyperlipidemia: Secondary | ICD-10-CM | POA: Diagnosis not present

## 2019-01-28 DIAGNOSIS — Z6831 Body mass index (BMI) 31.0-31.9, adult: Secondary | ICD-10-CM

## 2019-01-28 DIAGNOSIS — E669 Obesity, unspecified: Secondary | ICD-10-CM

## 2019-01-29 ENCOUNTER — Other Ambulatory Visit: Payer: Self-pay | Admitting: Family Medicine

## 2019-02-01 NOTE — Progress Notes (Signed)
Office: [phone]  /  Fax: [phone] TeleHealth Visit:  Tracy Brown has verbally consented to this TeleHealth visit today. The patient is located at home, the provider is located at the [facility] office. The participants in this visit include the listed provider and patient. The visit was conducted today via face time.  HPI:   Chief Complaint: OBESITY Tracy Brown is here to discuss her progress with her obesity treatment plan. She is on the Category 1 plan + 100 calories and is following her eating plan approximately 95 % of the time. She states she is exercising 0 minutes 0 times per week. Tracy Brown doing well starting meal plan. She has gone out to dinner 2 times. She denies hunger. She is doing crackers and laughing cow cheese for snacks. She has not been able to find whipped peanut butter. She couldn't find [employer] tortillas. She is weighing herself at home and states she has maintained at 168 lbs.  We were unable to weigh the patient today for this TeleHealth visit. She feels as if she has maintained her weight since her last visit. She has lost 0 lbs since starting treatment with [name].  Insulin Resistance Tracy Brown has a diagnosis of insulin resistance based on her elevated fasting insulin level >5. Although Tracy Brown's blood glucose readings are still under good control, insulin resistance puts her at greater risk of metabolic syndrome and diabetes. She notes only occasional carbohydrate cravings. She is not taking metformin currently and denies feelings of hypoglycemia. She continues to work on diet and exercise to decrease risk of diabetes.  Hyperlipidemia Tracy Brown has hyperlipidemia and has been trying to improve her cholesterol levels with intensive lifestyle modification including a low saturated fat diet, exercise and weight loss. Last LDL was of 121 and HDL of 67. She is not on statin and denies any chest pain, claudication or myalgias.  ASSESSMENT AND  PLAN:  Insulin resistance  Other hyperlipidemia  Class 1 obesity with serious comorbidity and body mass index (BMI) of 31.0 to 31.9 in adult, unspecified obesity type  PLAN:  Insulin Resistance Tracy Brown will continue to work on weight loss, exercise, and decreasing simple carbohydrates in her diet to help decrease the risk of diabetes. We dicussed metformin including benefits and risks. She was informed that eating too many simple carbohydrates or too many calories at one sitting increases the likelihood of GI side effects. Tracy Brown declined metformin for now and prescription was not written today. We will repeat labs in early September. Tracy Brown agrees to follow up with our clinic in 2 weeks as directed to monitor her progress.  Hyperlipidemia Tracy Brown was informed of the American Heart Association Guidelines emphasizing intensive lifestyle modifications as the first line treatment for hyperlipidemia. We discussed many lifestyle modifications today in depth, and Tracy Brown will continue to work on decreasing saturated fats such as fatty red meat, butter and many fried foods. She will also increase vegetables and lean protein in her diet and continue to work on exercise and weight loss efforts. We will repeat labs in early September. Tracy Brown agrees to follow up with our clinic in 2 weeks.  Obesity Tracy Brown is currently in the action stage of change. As such, her goal is to continue with weight loss efforts She has agreed to follow the Category 1 plan + 100 calories Tracy Brown has been instructed to work up to a goal of 150 minutes of combined cardio and strengthening exercise per week for weight loss and overall health benefits.  We discussed the following Behavioral Modification Strategies today: increasing lean protein intake, increasing vegetables, decrease eating out, work on meal planning and easy cooking plans, keeping healthy foods in the home.   Tracy Brown has agreed to follow up with our clinic in 2 weeks. She was  informed of the importance of frequent follow up visits to maximize her success with intensive lifestyle modifications for her multiple health conditions.  ALLERGIES: Allergies  Allergen Reactions  . Hydrocodone   . Phentermine Itching  . Zithromax [Azithromycin Dihydrate]     MEDICATIONS: Current Outpatient Medications on File Prior to Visit  Medication Sig Dispense Refill  . Ascorbic Acid (VITAMIN C) 1000 MG tablet Take 1,000 mg by mouth daily.    Marland Kitchen aspirin 81 MG chewable tablet Chew 81 mg by mouth daily.    . calcium carbonate (OSCAL) 1500 (600 Ca) MG TABS tablet Take 800 tablets by mouth daily with breakfast.     . cetirizine (ZYRTEC) 10 MG tablet Take 10 mg by mouth daily.    Marland Kitchen ibuprofen (ADVIL,MOTRIN) 200 MG tablet Take 200 mg by mouth every 6 (six) hours as needed.    Marland Kitchen levothyroxine (SYNTHROID, LEVOTHROID) 100 MCG tablet Take 1 tablet (100 mcg total) by mouth daily. 90 tablet 3  . MULTIPLE VITAMINS PO Take by mouth.    . Vitamin D, Ergocalciferol, (DRISDOL) 1.25 MG (50000 UT) CAPS capsule Take 1 capsule (50,000 Units total) by mouth every 7 (seven) days. 4 capsule 0   No current facility-administered medications on file prior to visit.     PAST MEDICAL HISTORY: Past Medical History:  Diagnosis Date  . Allergy   . Anemia   . Chickenpox   . Colon polyps   . Diverticulosis   . Heart murmur   . History of recurrent UTIs   . Hyperlipidemia   . Hypertension   . Hypothyroid   . Nephrolithiasis   . OA (osteoarthritis)   . Serrated adenoma of colon 02/2017    PAST SURGICAL HISTORY: Past Surgical History:  Procedure Laterality Date  . APPENDECTOMY    . BREAST CYST ASPIRATION Right   . COLON SURGERY  August 2012   right colectomy, per Dr. Judeth Horn, for tubular  adenoma   . COLONOSCOPY  02/28/2017   per Dr. Collene Mares, sessile serrated polyps, repeat in 5 years   . HEMORRHOID SURGERY    . HERNIA REPAIR    . RIGHT COLECTOMY  03-08-11   per Dr. Judeth Horn, for  tubulovillous adenoma  . TONSILLECTOMY      SOCIAL HISTORY: Social History   Tobacco Use  . Smoking status: Former Smoker    Packs/day: 1.00    Years: 37.00    Pack years: 37.00    Quit date: 10/10/2016    Years since quitting: 2.3  . Smokeless tobacco: Never Used  Substance Use Topics  . Alcohol use: Yes    Alcohol/week: 0.0 standard drinks    Comment: once a month  . Drug use: No    FAMILY HISTORY: Family History  Problem Relation Age of Onset  . Cancer Other        breast,colon,ovarian  . Coronary artery disease Other   . Hypertension Other   . Stroke Other   . Leukemia Father   . Cancer Father   . Breast cancer Maternal Grandfather   . Breast cancer Cousin   . Thyroid disease Mother   . Hypertension Mother   . Stroke Mother     ROS: Review  of Systems  Constitutional: Negative for weight loss.  Cardiovascular: Negative for chest pain and claudication.  Musculoskeletal: Negative for myalgias.  Endo/Heme/Allergies:       Negative hypoglycemia    PHYSICAL EXAM: Pt in no acute distress  RECENT LABS AND TESTS: BMET    Component Value Date/Time   NA 140 10/12/2018 1614   K 4.1 10/12/2018 1614   CL 105 10/12/2018 1614   CO2 25 10/12/2018 1614   GLUCOSE 89 10/12/2018 1614   BUN 19 10/12/2018 1614   CREATININE 0.77 10/12/2018 1614   CALCIUM 9.8 10/12/2018 1614   GFRNONAA >60 03/09/2011 0600   GFRAA >60 03/09/2011 0600   Lab Results  Component Value Date   HGBA1C 5.4 12/30/2018   Lab Results  Component Value Date   INSULIN 16.7 12/30/2018   CBC    Component Value Date/Time   WBC 7.9 10/12/2018 1614   RBC 4.81 10/12/2018 1614   HGB 13.7 10/12/2018 1614   HCT 41.1 10/12/2018 1614   PLT 270.0 10/12/2018 1614   MCV 85.3 10/12/2018 1614   MCH 29.4 03/09/2011 0600   MCHC 33.4 10/12/2018 1614   RDW 13.8 10/12/2018 1614   LYMPHSABS 3.0 10/12/2018 1614   MONOABS 0.6 10/12/2018 1614   EOSABS 0.1 10/12/2018 1614   BASOSABS 0.1 10/12/2018 1614    Iron/TIBC/Ferritin/ %Sat No results found for: IRON, TIBC, FERRITIN, IRONPCTSAT Lipid Panel     Component Value Date/Time   CHOL 204 (H) 12/30/2018 1210   TRIG 80 12/30/2018 1210   HDL 67 12/30/2018 1210   CHOLHDL 3 02/06/2018 1056   VLDL 17.2 02/06/2018 1056   LDLCALC 121 (H) 12/30/2018 1210   Hepatic Function Panel     Component Value Date/Time   PROT 7.1 10/12/2018 1614   ALBUMIN 4.6 10/12/2018 1614   AST 15 10/12/2018 1614   ALT 19 10/12/2018 1614   ALKPHOS 70 10/12/2018 1614   BILITOT 0.3 10/12/2018 1614   BILIDIR 0.1 10/12/2018 1614      Component Value Date/Time   TSH 0.75 10/12/2018 1614   TSH 0.27 (L) 05/13/2018 1550   TSH 2.43 02/06/2018 1056      I, Trixie Dredge, am acting as Location manager for Ilene Qua, MD  I have reviewed the above documentation for accuracy and completeness, and I agree with the above. - Ilene Qua, MD

## 2019-02-11 ENCOUNTER — Other Ambulatory Visit: Payer: Self-pay

## 2019-02-11 ENCOUNTER — Encounter (INDEPENDENT_AMBULATORY_CARE_PROVIDER_SITE_OTHER): Payer: Self-pay | Admitting: Family Medicine

## 2019-02-11 ENCOUNTER — Telehealth (INDEPENDENT_AMBULATORY_CARE_PROVIDER_SITE_OTHER): Payer: 59 | Admitting: Family Medicine

## 2019-02-11 DIAGNOSIS — Z6831 Body mass index (BMI) 31.0-31.9, adult: Secondary | ICD-10-CM

## 2019-02-11 DIAGNOSIS — E669 Obesity, unspecified: Secondary | ICD-10-CM

## 2019-02-11 DIAGNOSIS — E7849 Other hyperlipidemia: Secondary | ICD-10-CM | POA: Diagnosis not present

## 2019-02-11 DIAGNOSIS — E559 Vitamin D deficiency, unspecified: Secondary | ICD-10-CM

## 2019-02-11 MED ORDER — VITAMIN D (ERGOCALCIFEROL) 1.25 MG (50000 UNIT) PO CAPS: 50000.0000 [IU] | ORAL_CAPSULE | ORAL | 0 refills | Status: DC

## 2019-02-16 NOTE — Progress Notes (Signed)
Office: [phone]  /  Fax: [phone] TeleHealth Visit:  Tracy Brown has verbally consented to this TeleHealth visit today. The patient is located at home, the provider is located at the [facility] office. The participants in this visit include the listed provider and patient. The visit was conducted today via face time.  HPI:   Chief Complaint: OBESITY Tracy Brown is here to discuss her progress with her obesity treatment plan. She is on the Category 1 plan + 100 calories and is following her eating plan approximately 90 % of the time. She states she is walking 2-2.5 miles 3-4 times per week. Tracy Brown has plans to lay low this weekend for the 4th of July. She may go to see her daughter in [place]. She has followed the plan most of the time. She notes cravings for sweets and is doing hot cocoa ice cream bars to satisfy that sweet tooth.  We were unable to weigh the patient today for this TeleHealth visit. She feels as if she has lost 1-2 lbs since her last visit. She has lost 0 lbs since starting treatment with [name].  Vitamin D Deficiency Tracy Brown has a diagnosis of vitamin D deficiency. She is currently taking prescription Vit D. She notes fatigue and denies nausea, vomiting or muscle weakness.  Hyperlipidemia Tracy Brown has hyperlipidemia and has been trying to improve her cholesterol levels with intensive lifestyle modification including a low saturated fat diet, exercise and weight loss. She is not on statin and last LDL was of 121. She denies any chest pain, claudication or myalgias.  ASSESSMENT AND PLAN:  Other hyperlipidemia  Vitamin D deficiency - Plan: Vitamin D, Ergocalciferol, (DRISDOL) 1.25 MG (50000 UT) CAPS capsule  Class 1 obesity with serious comorbidity and body mass index (BMI) of 31.0 to 31.9 in adult, unspecified obesity type  PLAN:  Vitamin D Deficiency Tracy Brown was informed that low vitamin D levels contributes to fatigue and are associated with obesity,  breast, and colon cancer. Tracy Brown agrees to continue taking prescription Vit D 50,000 IU every week #4 and we will refill for 1 month. She will follow up for routine testing of vitamin D, at least 2-3 times per year. She was informed of the risk of over-replacement of vitamin D and agrees to not increase her dose unless she discusses this with [name] first. Tracy Brown agrees to follow up with our clinic in 4 weeks.  Hyperlipidemia Tracy Brown was informed of the American Heart Association Guidelines emphasizing intensive lifestyle modifications as the first line treatment for hyperlipidemia. We discussed many lifestyle modifications today in depth, and Tracy Brown will continue to work on decreasing saturated fats such as fatty red meat, butter and many fried foods. She will also increase vegetables and lean protein in her diet and continue to work on exercise and weight loss efforts. We will repeat FLP in 2 months. Willer agrees to follow up with our clinic in 4 weeks.  Obesity Tracy Brown is currently in the action stage of change. As such, her goal is to continue with weight loss efforts She has agreed to follow the Category 1 plan Tracy Brown has been instructed to work up to a goal of 150 minutes of combined cardio and strengthening exercise per week for weight loss and overall health benefits. We discussed the following Behavioral Modification Strategies today: increasing lean protein intake, increasing vegetables and work on meal planning and easy cooking plans, keeping healthy foods in the home, and planning for success   Tracy Brown has agreed  to follow up with our clinic in 4 weeks. She was informed of the importance of frequent follow up visits to maximize her success with intensive lifestyle modifications for her multiple health conditions.  ALLERGIES: Allergies  Allergen Reactions  . Hydrocodone   . Phentermine Itching  . Zithromax [Azithromycin Dihydrate]     MEDICATIONS: Current Outpatient Medications on File Prior to  Visit  Medication Sig Dispense Refill  . Ascorbic Acid (VITAMIN C) 1000 MG tablet Take 1,000 mg by mouth daily.    Marland Kitchen aspirin 81 MG chewable tablet Chew 81 mg by mouth daily.    . cetirizine (ZYRTEC) 10 MG tablet Take 10 mg by mouth daily.    Marland Kitchen ibuprofen (ADVIL,MOTRIN) 200 MG tablet Take 200 mg by mouth every 6 (six) hours as needed.    . MULTIPLE VITAMINS PO Take by mouth.    . SYNTHROID 100 MCG tablet Take 1 tablet (100 mcg total) by mouth daily before breakfast. 90 tablet 3  . calcium carbonate (OSCAL) 1500 (600 Ca) MG TABS tablet Take 800 tablets by mouth daily with breakfast.      No current facility-administered medications on file prior to visit.     PAST MEDICAL HISTORY: Past Medical History:  Diagnosis Date  . Allergy   . Anemia   . Chickenpox   . Colon polyps   . Diverticulosis   . Heart murmur   . History of recurrent UTIs   . Hyperlipidemia   . Hypertension   . Hypothyroid   . Nephrolithiasis   . OA (osteoarthritis)   . Serrated adenoma of colon 02/2017    PAST SURGICAL HISTORY: Past Surgical History:  Procedure Laterality Date  . APPENDECTOMY    . BREAST CYST ASPIRATION Right   . COLON SURGERY  August 2012   right colectomy, per Dr. Judeth Horn, for tubular  adenoma   . COLONOSCOPY  02/28/2017   per Dr. Collene Mares, sessile serrated polyps, repeat in 5 years   . HEMORRHOID SURGERY    . HERNIA REPAIR    . 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 TONSILLECTOMY      SOCIAL HISTORY: Social History   Tobacco Use  . Smoking status: Former Smoker    Packs/day: 1.00    Years: 37.00    Pack years: 37.00    Quit date: 10/10/2016    Years since quitting: 2.3  . Smokeless tobacco: Never Used  Substance Use Topics  . Alcohol use: Yes    Alcohol/week: 0.0 standard drinks    Comment: once a month  . Drug use: No    FAMILY HISTORY: Family History  Problem Relation Age of Onset  . Cancer Other        breast,colon,ovarian  .  Coronary artery disease Other   . Hypertension Other   . Stroke Other   . Leukemia Father   . Cancer Father   . Breast cancer Maternal Grandfather   . Breast cancer Cousin   . Thyroid disease Mother   . Hypertension Mother   . Stroke Mother     ROS: Review of Systems  Constitutional: Positive for malaise/fatigue and weight loss.  Cardiovascular: Negative for chest pain and claudication.  Gastrointestinal: Negative for nausea and vomiting.  Musculoskeletal: Negative for myalgias.       Negative muscle weakness    PHYSICAL EXAM: Pt in no acute distress  RECENT LABS AND TESTS: BMET    Component Value Date/Time  NA 140 10/12/2018 1614   K 4.1 10/12/2018 1614   CL 105 10/12/2018 1614   CO2 25 10/12/2018 1614   GLUCOSE 89 10/12/2018 1614   BUN 19 10/12/2018 1614   CREATININE 0.77 10/12/2018 1614   CALCIUM 9.8 10/12/2018 1614   GFRNONAA >60 03/09/2011 0600   GFRAA >60 03/09/2011 0600   Lab Results  Component Value Date   HGBA1C 5.4 12/30/2018   Lab Results  Component Value Date   INSULIN 16.7 12/30/2018   CBC    Component Value Date/Time   WBC 7.9 10/12/2018 1614   RBC 4.81 10/12/2018 1614   HGB 13.7 10/12/2018 1614   HCT 41.1 10/12/2018 1614   PLT 270.0 10/12/2018 1614   MCV 85.3 10/12/2018 1614   MCH 29.4 03/09/2011 0600   MCHC 33.4 10/12/2018 1614   RDW 13.8 10/12/2018 1614   LYMPHSABS 3.0 10/12/2018 1614   MONOABS 0.6 10/12/2018 1614   EOSABS 0.1 10/12/2018 1614   BASOSABS 0.1 10/12/2018 1614   Iron/TIBC/Ferritin/ %Sat No results found for: IRON, TIBC, FERRITIN, IRONPCTSAT Lipid Panel     Component Value Date/Time   CHOL 204 (H) 12/30/2018 1210   TRIG 80 12/30/2018 1210   HDL 67 12/30/2018 1210   CHOLHDL 3 02/06/2018 1056   VLDL 17.2 02/06/2018 1056   LDLCALC 121 (H) 12/30/2018 1210   Hepatic Function Panel     Component Value Date/Time   PROT 7.1 10/12/2018 1614   ALBUMIN 4.6 10/12/2018 1614   AST 15 10/12/2018 1614   ALT 19 10/12/2018  1614   ALKPHOS 70 10/12/2018 1614   BILITOT 0.3 10/12/2018 1614   BILIDIR 0.1 10/12/2018 1614      Component Value Date/Time   TSH 0.75 10/12/2018 1614   TSH 0.27 (L) 05/13/2018 1550   TSH 2.43 02/06/2018 1056      I, Trixie Dredge, am acting as Location manager for Ilene Qua, MD  I have reviewed the above documentation for accuracy and completeness, and I agree with the above. - Ilene Qua, MD

## 2019-03-11 ENCOUNTER — Encounter (INDEPENDENT_AMBULATORY_CARE_PROVIDER_SITE_OTHER): Payer: Self-pay | Admitting: Family Medicine

## 2019-03-11 ENCOUNTER — Other Ambulatory Visit: Payer: Self-pay

## 2019-03-11 ENCOUNTER — Telehealth (INDEPENDENT_AMBULATORY_CARE_PROVIDER_SITE_OTHER): Payer: 59 | Admitting: Family Medicine

## 2019-03-11 DIAGNOSIS — E559 Vitamin D deficiency, unspecified: Secondary | ICD-10-CM | POA: Diagnosis not present

## 2019-03-11 DIAGNOSIS — Z6831 Body mass index (BMI) 31.0-31.9, adult: Secondary | ICD-10-CM

## 2019-03-11 DIAGNOSIS — E7849 Other hyperlipidemia: Secondary | ICD-10-CM | POA: Diagnosis not present

## 2019-03-11 DIAGNOSIS — E669 Obesity, unspecified: Secondary | ICD-10-CM

## 2019-03-11 MED ORDER — VITAMIN D (ERGOCALCIFEROL) 1.25 MG (50000 UNIT) PO CAPS: 50000.0000 [IU] | ORAL_CAPSULE | ORAL | 0 refills | Status: DC

## 2019-03-14 NOTE — Progress Notes (Signed)
Office: [phone]  /  Fax: [phone] TeleHealth Visit:  Tracy Brown has verbally consented to this TeleHealth visit today. The patient is located at home, the provider is located at the [facility] office. The participants in this visit include the listed provider and patient. The visit was conducted today via face time.  HPI:   Chief Complaint: OBESITY Tracy Brown is here to discuss her progress with her obesity treatment plan. She is on the Category 1 plan and is following her eating plan approximately 100 % of the time. She states she is walking some 2-3 times per week. Tracy Brown voices she has stuck to the meal plan pretty religiously, and states she has only lost 2.5 lbs. She notes occasional hunger in mid afternoon, buy she thinks it is because of habit not true hunger. Her weight is of 166 lbs today. We were unable to weigh the patient today for this TeleHealth visit. She feels as if she has lost 2 lbs since her last visit. She has lost 0 lbs since starting treatment with [name].  Hyperlipidemia Tracy Brown has hyperlipidemia and has been trying to improve her cholesterol levels with intensive lifestyle modification including a low saturated fat diet, exercise and weight loss. Last LDL was of 121 and HDL of 67. She is not on statin and denies any chest pain, claudication or myalgias.  Vitamin D Deficiency Tracy Brown has a diagnosis of vitamin D deficiency. She is currently taking prescription Vit D. She notes fatigue and denies nausea, vomiting or muscle weakness.  ASSESSMENT AND PLAN:  Other hyperlipidemia  Vitamin D deficiency - Plan: Vitamin D, Ergocalciferol, (DRISDOL) 1.25 MG (50000 UT) CAPS capsule  Class 1 obesity with serious comorbidity and body mass index (BMI) of 31.0 to 31.9 in adult, unspecified obesity type  PLAN:  Hyperlipidemia Tracy Brown was informed of the American Heart Association Guidelines emphasizing intensive lifestyle modifications as the first line treatment  for hyperlipidemia. We discussed many lifestyle modifications today in depth, and Tracy Brown will continue to work on decreasing saturated fats such as fatty red meat, butter and many fried foods. She will also increase vegetables and lean protein in her diet and continue to work on exercise and weight loss efforts. We will repeat FLP in early September. Tracy Brown agrees to follow up with our clinic in 4 weeks.  Vitamin D Deficiency Tracy Brown was informed that low vitamin D levels contributes to fatigue and are associated with obesity, breast, and colon cancer. T[place]iece agrees to continue taking prescription Vit D 50,000 IU every week #4 and we will refill for 1 month. She will follow up for routine testing of vitamin D, at least 2-3 times per year. She was informed of the risk of over-replacement of vitamin D and agrees to not increase her dose unless she discusses this with [name] first. Tracy Brown agrees to follow up with our clinic in 4 weeks.  Obesity Tracy Brown is currently in the action stage of change. As such, her goal is to continue with weight loss efforts She has agreed to follow the Category 1 plan Tracy Brown has been instructed to work up to a goal of 150 minutes of combined cardio and strengthening exercise per week for weight loss and overall health benefits. We discussed the following Behavioral Modification Strategies today: increasing lean protein intake, increasing vegetables and work on meal planning and easy cooking plans, keeping healthy foods in the home, and planning for success   Tracy Brown has agreed to follow up with our clinic in  4 weeks. She was informed of the importance of frequent follow up visits to maximize her success with intensive lifestyle modifications for her multiple health conditions.  ALLERGIES: Allergies  Allergen Reactions  . Hydrocodone   . Phentermine Itching  . Zithromax [Azithromycin Dihydrate]     MEDICATIONS: Current Outpatient Medications on File Prior to Visit  Medication Sig  Dispense Refill  . Ascorbic Acid (VITAMIN C) 1000 MG tablet Take 1,000 mg by mouth daily.    Marland Kitchen aspirin 81 MG chewable tablet Chew 81 mg by mouth daily.    . calcium carbonate (OSCAL) 1500 (600 Ca) MG TABS tablet Take 800 tablets by mouth daily with breakfast.     . cetirizine (ZYRTEC) 10 MG tablet Take 10 mg by mouth daily.    Marland Kitchen ibuprofen (ADVIL,MOTRIN) 200 MG tablet Take 200 mg by mouth every 6 (six) hours as needed.    . MULTIPLE VITAMINS PO Take by mouth.    . SYNTHROID 100 MCG tablet Take 1 tablet (100 mcg total) by mouth daily before breakfast. 90 tablet 3   No current facility-administered medications on file prior to visit.     PAST MEDICAL HISTORY: Past Medical History:  Diagnosis Date  . Allergy   . Anemia   . Chickenpox   . Colon polyps   . Diverticulosis   . Heart murmur   . History of recurrent UTIs   . Hyperlipidemia   . Hypertension   . Hypothyroid   . Nephrolithiasis   . OA (osteoarthritis)   . Serrated adenoma of colon 02/2017    PAST SURGICAL HISTORY: Past Surgical History:  Procedure Laterality Date  . APPENDECTOMY    . BREAST CYST ASPIRATION Right   . COLON SURGERY  August 2012   right colectomy, per Dr. Judeth Horn, for tubular  adenoma   . COLONOSCOPY  02/28/2017   per Dr. Collene Mares, sessile serrated polyps, repeat in 5 years   . HEMORRHOID SURGERY    . HERNIA REPAIR    . 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 TONSILLECTOMY      SOCIAL HISTORY: Social History   Tobacco Use  . Smoking status: Former Smoker    Packs/day: 1.00    Years: 37.00    Pack years: 37.00    Quit date: 10/10/2016    Years since quitting: 2.4  . Smokeless tobacco: Never Used  Substance Use Topics  . Alcohol use: Yes    Alcohol/week: 0.0 standard drinks    Comment: once a month  . Drug use: No    FAMILY HISTORY: Family History  Problem Relation Age of Onset  . Cancer Other        breast,colon,ovarian  . Coronary artery disease  Other   . Hypertension Other   . Stroke Other   . Leukemia Father   . Cancer Father   . Breast cancer Maternal Grandfather   . Breast cancer Cousin   . Thyroid disease Mother   . Hypertension Mother   . Stroke Mother     ROS: Review of Systems  Constitutional: Positive for malaise/fatigue and weight loss.  Cardiovascular: Negative for chest pain and claudication.  Gastrointestinal: Negative for nausea and vomiting.  Musculoskeletal: Negative for myalgias.       Negative muscle weakness    PHYSICAL EXAM: Pt in no acute distress  RECENT LABS AND TESTS: BMET    Component Value Date/Time   NA 140 10/12/2018 1614   K  4.1 10/12/2018 1614   CL 105 10/12/2018 1614   CO2 25 10/12/2018 1614   GLUCOSE 89 10/12/2018 1614   BUN 19 10/12/2018 1614   CREATININE 0.77 10/12/2018 1614   CALCIUM 9.8 10/12/2018 1614   GFRNONAA >60 03/09/2011 0600   GFRAA >60 03/09/2011 0600   Lab Results  Component Value Date   HGBA1C 5.4 12/30/2018   Lab Results  Component Value Date   INSULIN 16.7 12/30/2018   CBC    Component Value Date/Time   WBC 7.9 10/12/2018 1614   RBC 4.81 10/12/2018 1614   HGB 13.7 10/12/2018 1614   HCT 41.1 10/12/2018 1614   PLT 270.0 10/12/2018 1614   MCV 85.3 10/12/2018 1614   MCH 29.4 03/09/2011 0600   MCHC 33.4 10/12/2018 1614   RDW 13.8 10/12/2018 1614   LYMPHSABS 3.0 10/12/2018 1614   MONOABS 0.6 10/12/2018 1614   EOSABS 0.1 10/12/2018 1614   BASOSABS 0.1 10/12/2018 1614   Iron/TIBC/Ferritin/ %Sat No results found for: IRON, TIBC, FERRITIN, IRONPCTSAT Lipid Panel     Component Value Date/Time   CHOL 204 (H) 12/30/2018 1210   TRIG 80 12/30/2018 1210   HDL 67 12/30/2018 1210   CHOLHDL 3 02/06/2018 1056   VLDL 17.2 02/06/2018 1056   LDLCALC 121 (H) 12/30/2018 1210   Hepatic Function Panel     Component Value Date/Time   PROT 7.1 10/12/2018 1614   ALBUMIN 4.6 10/12/2018 1614   AST 15 10/12/2018 1614   ALT 19 10/12/2018 1614   ALKPHOS 70  10/12/2018 1614   BILITOT 0.3 10/12/2018 1614   BILIDIR 0.1 10/12/2018 1614      Component Value Date/Time   TSH 0.75 10/12/2018 1614   TSH 0.27 (L) 05/13/2018 1550   TSH 2.43 02/06/2018 1056      I, Trixie Dredge, am acting as Location manager for Ilene Qua, MD   I have reviewed the above documentation for accuracy and completeness, and I agree with the above. - Ilene Qua, MD

## 2019-03-29 ENCOUNTER — Encounter (INDEPENDENT_AMBULATORY_CARE_PROVIDER_SITE_OTHER): Payer: Self-pay | Admitting: Family Medicine

## 2019-03-30 NOTE — Telephone Encounter (Signed)
Please review

## 2019-04-02 ENCOUNTER — Other Ambulatory Visit: Payer: Self-pay

## 2019-04-02 ENCOUNTER — Ambulatory Visit (INDEPENDENT_AMBULATORY_CARE_PROVIDER_SITE_OTHER): Payer: 59 | Admitting: Family Medicine

## 2019-04-02 ENCOUNTER — Encounter: Payer: Self-pay | Admitting: Family Medicine

## 2019-04-02 VITALS — BP 120/62 | HR 74 | Temp 98.2°F | Wt 163.0 lb

## 2019-04-02 DIAGNOSIS — E038 Other specified hypothyroidism: Secondary | ICD-10-CM

## 2019-04-02 DIAGNOSIS — Z0000 Encounter for general adult medical examination without abnormal findings: Secondary | ICD-10-CM

## 2019-04-02 LAB — POC URINALSYSI DIPSTICK (AUTOMATED)
Bilirubin, UA: NEGATIVE
Blood, UA: NEGATIVE
Glucose, UA: NEGATIVE
Ketones, UA: NEGATIVE
Leukocytes, UA: NEGATIVE
Nitrite, UA: NEGATIVE
Protein, UA: NEGATIVE
Spec Grav, UA: 1.025 (ref 1.010–1.025)
Urobilinogen, UA: 0.2 E.U./dL
pH, UA: 6 (ref 5.0–8.0)

## 2019-04-02 LAB — HEPATIC FUNCTION PANEL
ALT: 38 U/L — ABNORMAL HIGH (ref 0–35)
AST: 24 U/L (ref 0–37)
Albumin: 4.8 g/dL (ref 3.5–5.2)
Alkaline Phosphatase: 64 U/L (ref 39–117)
Bilirubin, Direct: 0.1 mg/dL (ref 0.0–0.3)
Total Bilirubin: 0.3 mg/dL (ref 0.2–1.2)
Total Protein: 7.1 g/dL (ref 6.0–8.3)

## 2019-04-02 LAB — LIPID PANEL
Cholesterol: 193 mg/dL (ref 0–200)
HDL: 52 mg/dL (ref >39.00)
LDL Cholesterol: 125 mg/dL — ABNORMAL HIGH (ref 0–99)
NonHDL: 141.32
Total CHOL/HDL Ratio: 4
Triglycerides: 81 mg/dL (ref 0.0–149.0)
VLDL: 16.2 mg/dL (ref 0.0–40.0)

## 2019-04-02 LAB — CBC WITH DIFFERENTIAL/PLATELET
Basophils Absolute: 0 10*3/uL (ref 0.0–0.1)
Basophils Relative: 0.8 % (ref 0.0–3.0)
Eosinophils Absolute: 0.1 10*3/uL (ref 0.0–0.7)
Eosinophils Relative: 1.6 % (ref 0.0–5.0)
HCT: 38.7 % (ref 36.0–46.0)
Hemoglobin: 13.2 g/dL (ref 12.0–15.0)
Lymphocytes Relative: 45.3 % (ref 12.0–46.0)
Lymphs Abs: 2.8 10*3/uL (ref 0.7–4.0)
MCHC: 34 g/dL (ref 30.0–36.0)
MCV: 84.3 fl (ref 78.0–100.0)
Monocytes Absolute: 0.4 10*3/uL (ref 0.1–1.0)
Monocytes Relative: 7.1 % (ref 3.0–12.0)
Neutro Abs: 2.8 10*3/uL (ref 1.4–7.7)
Neutrophils Relative %: 45.2 % (ref 43.0–77.0)
Platelets: 209 10*3/uL (ref 150.0–400.0)
RBC: 4.59 Mil/uL (ref 3.87–5.11)
RDW: 13.7 % (ref 11.5–15.5)
WBC: 6.1 10*3/uL (ref 4.0–10.5)

## 2019-04-02 LAB — T3, FREE: T3, Free: 2.9 pg/mL (ref 2.3–4.2)

## 2019-04-02 LAB — BASIC METABOLIC PANEL
BUN: 17 mg/dL (ref 6–23)
CO2: 27 mEq/L (ref 19–32)
Calcium: 10 mg/dL (ref 8.4–10.5)
Chloride: 105 mEq/L (ref 96–112)
Creatinine, Ser: 0.77 mg/dL (ref 0.40–1.20)
GFR: 77.08 mL/min (ref >60.00)
Glucose, Bld: 88 mg/dL (ref 70–99)
Potassium: 4.2 mEq/L (ref 3.5–5.1)
Sodium: 139 mEq/L (ref 135–145)

## 2019-04-02 LAB — T4, FREE: Free T4: 1.13 ng/dL (ref 0.60–1.60)

## 2019-04-02 LAB — TSH: TSH: 0.37 u[IU]/mL (ref 0.35–4.50)

## 2019-04-02 MED ORDER — TERBINAFINE HCL 250 MG PO TABS: 250.0000 mg | ORAL_TABLET | Freq: Every day | ORAL | 1 refills | Status: DC

## 2019-04-02 MED ORDER — LEVOTHYROXINE SODIUM 100 MCG PO TABS: 100.0000 ug | ORAL_TABLET | Freq: Every day | ORAL | 3 refills | Status: DC

## 2019-04-02 NOTE — Progress Notes (Signed)
   Subjective:    Patient ID: Tracy Brown, female    DOB: [birth year], 58 y.o.   MRN: [id number]  HPI Here for a well exam. She feels well but she asks about color changes on her toenails. She is trying to lose weight and she walks every day.    Review of Systems  Constitutional: Negative.   HENT: Negative.   Eyes: Negative.   Respiratory: Negative.   Cardiovascular: Negative.   Gastrointestinal: Negative.   Genitourinary: Negative for decreased urine volume, difficulty urinating, dyspareunia, dysuria, enuresis, flank pain, frequency, hematuria, pelvic pain and urgency.  Musculoskeletal: Negative.   Skin: Negative.   Neurological: Negative.   Psychiatric/Behavioral: Negative.        Objective:   Physical Exam Constitutional:      General: She is not in acute distress.    Appearance: She is well-developed.  HENT:     Head: Normocephalic and atraumatic.     Right Ear: External ear normal.     Left Ear: External ear normal.     Nose: Nose normal.     Mouth/Throat:     Pharynx: No oropharyngeal exudate.  Eyes:     General: No scleral icterus.    Conjunctiva/sclera: Conjunctivae normal.     Pupils: Pupils are equal, round, and reactive to light.  Neck:     Musculoskeletal: Normal range of motion and neck supple.     Thyroid: No thyromegaly.     Vascular: No JVD.  Cardiovascular:     Rate and Rhythm: Normal rate and regular rhythm.     Heart sounds: Normal heart sounds. No murmur. No friction rub. No gallop.   Pulmonary:     Effort: Pulmonary effort is normal. No respiratory distress.     Breath sounds: Normal breath sounds. No wheezing or rales.  Chest:     Chest wall: No tenderness.  Abdominal:     General: Bowel sounds are normal. There is no distension.     Palpations: Abdomen is soft. There is no mass.     Tenderness: There is no abdominal tenderness. There is no guarding or rebound.  Musculoskeletal: Normal range of motion.        General: No tenderness.   Lymphadenopathy:     Cervical: No cervical adenopathy.  Skin:    General: Skin is warm and dry.     Findings: No erythema or rash.     Comments: 8 of 10 toenails are thick and yellowish white   Neurological:     Mental Status: She is alert and oriented to person, place, and time.     Cranial Nerves: No cranial nerve deficit.     Motor: No abnormal muscle tone.     Coordination: Coordination normal.     Deep Tendon Reflexes: Reflexes are normal and symmetric. Reflexes normal.  Psychiatric:        Behavior: Behavior normal.        Thought Content: Thought content normal.        Judgment: Judgment normal.           Assessment & Plan:  Well exam. We discussed diet and exercise. Get fasting labs. Treat the onychomycosis with Terbinafine.  Alysia Penna, MD

## 2019-04-07 ENCOUNTER — Other Ambulatory Visit: Payer: Self-pay

## 2019-04-07 ENCOUNTER — Encounter (INDEPENDENT_AMBULATORY_CARE_PROVIDER_SITE_OTHER): Payer: Self-pay | Admitting: Family Medicine

## 2019-04-07 ENCOUNTER — Ambulatory Visit (INDEPENDENT_AMBULATORY_CARE_PROVIDER_SITE_OTHER): Payer: 59 | Admitting: Family Medicine

## 2019-04-07 DIAGNOSIS — E559 Vitamin D deficiency, unspecified: Secondary | ICD-10-CM

## 2019-04-07 DIAGNOSIS — Z6831 Body mass index (BMI) 31.0-31.9, adult: Secondary | ICD-10-CM | POA: Diagnosis not present

## 2019-04-07 DIAGNOSIS — E8881 Metabolic syndrome: Secondary | ICD-10-CM

## 2019-04-07 DIAGNOSIS — E669 Obesity, unspecified: Secondary | ICD-10-CM

## 2019-04-07 MED ORDER — VITAMIN D (ERGOCALCIFEROL) 1.25 MG (50000 UNIT) PO CAPS: 50000.0000 [IU] | ORAL_CAPSULE | ORAL | 0 refills | Status: DC

## 2019-04-08 NOTE — Progress Notes (Signed)
Office: [phone]  /  Fax: [phone] TeleHealth Visit:  Tracy Brown has verbally consented to this TeleHealth visit today. The patient is located at home, the provider is located at the [facility] office. The participants in this visit include the listed provider and patient. The visit was conducted today via FaceTime.  HPI:   Chief Complaint: OBESITY Tracy Brown is here to discuss her progress with her obesity treatment plan. She is on the Category 1 plan and is following her eating plan approximately 100% of the time. She states she is walking 2 hours.  Sema is occasionally hungry and, therefore, still doing 2 snacks. She states she is getting 6 ounces at dinner. We were unable to weigh the patient today for this TeleHealth visit. She feels as if she has lost weight since her last visit. She has lost 0 lbs since starting treatment with [name].  Vitamin D deficiency Tracy Brown has a diagnosis of Vitamin D deficiency. She is currently taking prescription Vit D and denies nausea, vomiting or muscle weakness, but does admit to fatigue.  Insulin Resistance Tracy Brown has a diagnosis of insulin resistance based on her elevated fasting insulin level >5. Although Tracy Brown's blood glucose readings are still under good control, insulin resistance puts her at greater risk of metabolic syndrome and diabetes. She is not taking metformin currently and continues to work on diet and exercise to decrease risk of diabetes. Tracy Brown reports occasional carb cravings and hunger.  ASSESSMENT AND PLAN:  Insulin resistance  Vitamin D deficiency - Plan: Vitamin D, Ergocalciferol, (DRISDOL) 1.25 MG (50000 UT) CAPS capsule  Class 1 obesity with serious comorbidity and body mass index (BMI) of 31.0 to 31.9 in adult, unspecified obesity type  PLAN:  Vitamin D Deficiency Tracy Brown was informed that low Vitamin D levels contributes to fatigue and are associated with obesity, breast, and colon cancer. She agrees to  continue to take prescription Vit D @ 50,000 IU every week #4 with 0 refills and will follow-up for routine testing of Vitamin D, at least 2-3 times per year. She was informed of the risk of over-replacement of Vitamin D and agrees to not increase her dose unless she discusses this with [name] first. Tracy Brown agrees to follow-up with our clinic in 2 weeks.  Insulin Resistance Tracy Brown will continue to work on weight loss, exercise, and decreasing simple carbohydrates in her diet to help decrease the risk of diabetes. We dicussed metformin including benefits and risks. She was informed that eating too many simple carbohydrates or too many calories at one sitting increases the likelihood of GI side effects. Tracy Brown will have Hgb A1c retested at her first in-office appointment. She will follow-up with [name] as directed to monitor her progress.  Obesity Tracy Brown is currently in the action stage of change. As such, her goal is to continue with weight loss efforts. She has agreed to follow the Category 1 plan + 1 cup of vegetables. Tracy Brown will have repeat IC in October. Tracy Brown has been instructed to work up to a goal of 150 minutes of combined cardio and strengthening exercise per week for weight loss and overall health benefits. We discussed the following Behavioral Modification Strategies today: increasing lean protein intake, increasing vegetables, work on meal planning and easy cooking plans, keeping healthy foods in the home, and planning for success.  Tracy Brown has agreed to follow-up with our clinic in 2 weeks. She was informed of the importance of frequent follow-up visits to maximize her success with intensive  lifestyle modifications for her multiple health conditions.  ALLERGIES: Allergies  Allergen Reactions  . Hydrocodone   . Phentermine Itching  . Zithromax [Azithromycin Dihydrate]     MEDICATIONS: Current Outpatient Medications on File Prior to Visit  Medication Sig Dispense Refill  . Ascorbic Acid  (VITAMIN C) 1000 MG tablet Take 1,000 mg by mouth daily.    Marland Kitchen aspirin 81 MG chewable tablet Chew 81 mg by mouth daily.    . cetirizine (ZYRTEC) 10 MG tablet Take 10 mg by mouth daily.    Marland Kitchen ibuprofen (ADVIL,MOTRIN) 200 MG tablet Take 200 mg by mouth every 6 (six) hours as needed.    Marland Kitchen levothyroxine (SYNTHROID) 100 MCG tablet Take 1 tablet (100 mcg total) by mouth daily. 90 tablet 3  . MULTIPLE VITAMINS PO Take by mouth.    . terbinafine (LAMISIL) 250 MG tablet Take 1 tablet (250 mg total) by mouth daily. 90 tablet 1  . calcium carbonate (OSCAL) 1500 (600 Ca) MG TABS tablet Take 800 tablets by mouth daily with breakfast.      No current facility-administered medications on file prior to visit.     PAST MEDICAL HISTORY: Past Medical History:  Diagnosis Date  . Allergy   . Anemia   . Chickenpox   . Colon polyps   . Diverticulosis   . Heart murmur   . History of recurrent UTIs   . Hyperlipidemia   . Hypertension   . Hypothyroid   . Nephrolithiasis   . OA (osteoarthritis)   . Serrated adenoma of colon 02/2017    PAST SURGICAL HISTORY: Past Surgical History:  Procedure Laterality Date  . APPENDECTOMY    . BREAST CYST ASPIRATION Right   . COLON SURGERY  August 2012   right colectomy, per Dr. Judeth Horn, for tubular  adenoma   . COLONOSCOPY  02/28/2017   per Dr. Collene Mares, sessile serrated polyps, repeat in 5 years   . HEMORRHOID SURGERY    . HERNIA REPAIR    . 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 TONSILLECTOMY      SOCIAL HISTORY: Social History   Tobacco Use  . Smoking status: Former Smoker    Packs/day: 1.00    Years: 37.00    Pack years: 37.00    Quit date: 10/10/2016    Years since quitting: 2.4  . Smokeless tobacco: Never Used  Substance Use Topics  . Alcohol use: Yes    Alcohol/week: 0.0 standard drinks    Comment: once a month  . Drug use: No    FAMILY HISTORY: Family History  Problem Relation Age of Onset  . Cancer  Other        breast,colon,ovarian  . Coronary artery disease Other   . Hypertension Other   . Stroke Other   . Leukemia Father   . Cancer Father   . Breast cancer Maternal Grandfather   . Breast cancer Cousin   . Thyroid disease Mother   . Hypertension Mother   . Stroke Mother    ROS: Review of Systems  Constitutional: Positive for malaise/fatigue.  Gastrointestinal: Negative for nausea and vomiting.  Musculoskeletal:       Negative for muscle weakness.   PHYSICAL EXAM: Pt in no acute distress  RECENT LABS AND TESTS: BMET    Component Value Date/Time   NA 139 04/02/2019 1431   K 4.2 04/02/2019 1431   CL 105 04/02/2019 1431   CO2 27 04/02/2019 1431  GLUCOSE 88 04/02/2019 1431   BUN 17 04/02/2019 1431   CREATININE 0.77 04/02/2019 1431   CALCIUM 10.0 04/02/2019 1431   GFRNONAA >60 03/09/2011 0600   GFRAA >60 03/09/2011 0600   Lab Results  Component Value Date   HGBA1C 5.4 12/30/2018   Lab Results  Component Value Date   INSULIN 16.7 12/30/2018   CBC    Component Value Date/Time   WBC 6.1 04/02/2019 1431   RBC 4.59 04/02/2019 1431   HGB 13.2 04/02/2019 1431   HCT 38.7 04/02/2019 1431   PLT 209.0 04/02/2019 1431   MCV 84.3 04/02/2019 1431   MCH 29.4 03/09/2011 0600   MCHC 34.0 04/02/2019 1431   RDW 13.7 04/02/2019 1431   LYMPHSABS 2.8 04/02/2019 1431   MONOABS 0.4 04/02/2019 1431   EOSABS 0.1 04/02/2019 1431   BASOSABS 0.0 04/02/2019 1431   Iron/TIBC/Ferritin/ %Sat No results found for: IRON, TIBC, FERRITIN, IRONPCTSAT Lipid Panel     Component Value Date/Time   CHOL 193 04/02/2019 1431   CHOL 204 (H) 12/30/2018 1210   TRIG 81.0 04/02/2019 1431   HDL 52.00 04/02/2019 1431   HDL 67 12/30/2018 1210   CHOLHDL 4 04/02/2019 1431   VLDL 16.2 04/02/2019 1431   LDLCALC 125 (H) 04/02/2019 1431   LDLCALC 121 (H) 12/30/2018 1210   Hepatic Function Panel     Component Value Date/Time   PROT 7.1 04/02/2019 1431   ALBUMIN 4.8 04/02/2019 1431   AST 24  04/02/2019 1431   ALT 38 (H) 04/02/2019 1431   ALKPHOS 64 04/02/2019 1431   BILITOT 0.3 04/02/2019 1431   BILIDIR 0.1 04/02/2019 1431      Component Value Date/Time   TSH 0.37 04/02/2019 1431   TSH 0.75 10/12/2018 1614   TSH 0.27 (L) 05/13/2018 1550   Results for EVANEE, SQUARE (MRN [id number]) as of 04/08/2019 08:53  Ref. Range 10/12/2018 16:14  VITD Latest Ref Range: 30.00 - 100.00 ng/mL 35.59   I, Michaelene Song, am acting as Location manager for Ilene Qua, MD  I have reviewed the above documentation for accuracy and completeness, and I agree with the above. - Ilene Qua, MD

## 2019-05-04 ENCOUNTER — Other Ambulatory Visit: Payer: Self-pay

## 2019-05-04 ENCOUNTER — Encounter (INDEPENDENT_AMBULATORY_CARE_PROVIDER_SITE_OTHER): Payer: Self-pay | Admitting: Family Medicine

## 2019-05-04 ENCOUNTER — Ambulatory Visit (INDEPENDENT_AMBULATORY_CARE_PROVIDER_SITE_OTHER): Payer: 59 | Admitting: Family Medicine

## 2019-05-04 DIAGNOSIS — E8881 Metabolic syndrome: Secondary | ICD-10-CM

## 2019-05-04 DIAGNOSIS — E559 Vitamin D deficiency, unspecified: Secondary | ICD-10-CM

## 2019-05-04 DIAGNOSIS — E669 Obesity, unspecified: Secondary | ICD-10-CM | POA: Diagnosis not present

## 2019-05-04 DIAGNOSIS — Z6831 Body mass index (BMI) 31.0-31.9, adult: Secondary | ICD-10-CM

## 2019-05-04 MED ORDER — VITAMIN D (ERGOCALCIFEROL) 1.25 MG (50000 UNIT) PO CAPS: 50000.0000 [IU] | ORAL_CAPSULE | ORAL | 0 refills | Status: DC

## 2019-05-04 MED ORDER — METFORMIN HCL 500 MG PO TABS: 500.0000 mg | ORAL_TABLET | Freq: Every day | ORAL | 0 refills | Status: DC

## 2019-05-06 NOTE — Progress Notes (Signed)
Office: [phone]  /  Fax: [phone] TeleHealth Visit:  Tracy Brown has verbally consented to this TeleHealth visit today. The patient is located at home, the provider is located at the [facility] office. The participants in this visit include the listed provider and patient. The visit was conducted today via face time.  HPI:   Chief Complaint: OBESITY Tracy Brown is here to discuss her progress with her obesity treatment plan. She is on the Category 1 plan with 1 cup of vegetables and is following her eating plan approximately 98 % of the time. She states she is walking for 45 minutes 3 times per week. Tracy Brown is not getting hungry. She skipped the sandwich the past 2 days secondary to abdominal discomfort. She reports she can't recall her weight from last appointment but she thinks she lost 3 lbs. She believes she is down 12 lbs. She knows her weight is under 160 lbs.  We were unable to weigh the patient today for this TeleHealth visit. She feels as if she has lost 3 lbs since her last visit. She has lost 0 lbs since starting treatment with [name].  Vitamin D Deficiency Tracy Brown has a diagnosis of vitamin D deficiency. She is currently taking prescription Vit D. She notes fatigue and denies nausea, vomiting or muscle weakness.  Insulin Resistance Tracy Brown has a diagnosis of insulin resistance based on her elevated fasting insulin level >5. Although Tracy Brown's blood glucose readings are still under good control, insulin resistance puts her at greater risk of metabolic syndrome and diabetes. She is not having much carbohydrate cravings and no treatment prior with metformin. She continues to work on diet and exercise to decrease risk of diabetes.  ASSESSMENT AND PLAN:  Insulin resistance  Vitamin D deficiency - Plan: Vitamin D, Ergocalciferol, (DRISDOL) 1.25 MG (50000 UT) CAPS capsule  Class 1 obesity with serious comorbidity and body mass index (BMI) of 31.0 to 31.9 in adult,  unspecified obesity type  PLAN:  Vitamin D Deficiency Tracy Brown was informed that low vitamin D levels contributes to fatigue and are associated with obesity, breast, and colon cancer. Tracy Brown agrees to continue taking prescription Vit D 50,000 IU every week #4 and we will refill for 1 month. She will follow up for routine testing of vitamin D, at least 2-3 times per year. She was informed of the risk of over-replacement of vitamin D and agrees to not increase her dose unless she discusses this with [name] first. Tracy Brown agrees to follow up with our clinic in 4 weeks.  Insulin Resistance Tracy Brown will continue to work on weight loss, exercise, and decreasing simple carbohydrates in her diet to help decrease the risk of diabetes. We dicussed metformin including benefits and risks. She was informed that eating too many simple carbohydrates or too many calories at one sitting increases the likelihood of GI side effects. Tracy Brown agrees to start metformin 500 mg PO daily #30 with no refills. Tracy Brown agrees to follow up with our clinic in 4 weeks as directed to monitor her progress.  Obesity Tracy Brown is currently in the action stage of change. As such, her goal is to continue with weight loss efforts She has agreed to follow the Category 1 plan Tracy Brown has been instructed to work up to a goal of 150 minutes of combined cardio and strengthening exercise per week for weight loss and overall health benefits. We discussed the following Behavioral Modification Strategies today: increasing lean protein intake, increasing vegetables and work on meal planning  and easy cooking plans, keeping healthy foods in the home, and planning for success   Tracy Brown has agreed to follow up with our clinic in 4 weeks. She was informed of the importance of frequent follow up visits to maximize her success with intensive lifestyle modifications for her multiple health conditions.  ALLERGIES: Allergies  Allergen Reactions  . Hydrocodone   .  Phentermine Itching  . Zithromax [Azithromycin Dihydrate]     MEDICATIONS: Current Outpatient Medications on File Prior to Visit  Medication Sig Dispense Refill  . Ascorbic Acid (VITAMIN C) 1000 MG tablet Take 1,000 mg by mouth daily.    Marland Kitchen aspirin 81 MG chewable tablet Chew 81 mg by mouth daily.    . calcium carbonate (OSCAL) 1500 (600 Ca) MG TABS tablet Take 800 tablets by mouth daily with breakfast.     . cetirizine (ZYRTEC) 10 MG tablet Take 10 mg by mouth daily.    Marland Kitchen ibuprofen (ADVIL,MOTRIN) 200 MG tablet Take 200 mg by mouth every 6 (six) hours as needed.    Marland Kitchen levothyroxine (SYNTHROID) 100 MCG tablet Take 1 tablet (100 mcg total) by mouth daily. 90 tablet 3  . MULTIPLE VITAMINS PO Take by mouth.    . terbinafine (LAMISIL) 250 MG tablet Take 1 tablet (250 mg total) by mouth daily. 90 tablet 1   No current facility-administered medications on file prior to visit.     PAST MEDICAL HISTORY: Past Medical History:  Diagnosis Date  . Allergy   . Anemia   . Chickenpox   . Colon polyps   . Diverticulosis   . Heart murmur   . History of recurrent UTIs   . Hyperlipidemia   . Hypertension   . Hypothyroid   . Nephrolithiasis   . OA (osteoarthritis)   . Serrated adenoma of colon 02/2017    PAST SURGICAL HISTORY: Past Surgical History:  Procedure Laterality Date  . APPENDECTOMY    . BREAST CYST ASPIRATION Right   . COLON SURGERY  August 2012   right colectomy, per Dr. Judeth Horn, for tubular  adenoma   . COLONOSCOPY  02/28/2017   per Dr. Collene Mares, sessile serrated polyps, repeat in 5 years   . HEMORRHOID SURGERY    . HERNIA REPAIR    . 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 TONSILLECTOMY      SOCIAL HISTORY: Social History   Tobacco Use  . Smoking status: Former Smoker    Packs/day: 1.00    Years: 37.00    Pack years: 37.00    Quit date: 10/10/2016    Years since quitting: 2.5  . Smokeless tobacco: Never Used  Substance Use Topics   . Alcohol use: Yes    Alcohol/week: 0.0 standard drinks    Comment: once a month  . Drug use: No    FAMILY HISTORY: Family History  Problem Relation Age of Onset  . Cancer Other        breast,colon,ovarian  . Coronary artery disease Other   . Hypertension Other   . Stroke Other   . Leukemia Father   . Cancer Father   . Breast cancer Maternal Grandfather   . Breast cancer Cousin   . Thyroid disease Mother   . Hypertension Mother   . Stroke Mother     ROS: Review of Systems  Constitutional: Positive for malaise/fatigue and weight loss.  Gastrointestinal: Negative for nausea and vomiting.  Musculoskeletal:       Negative  muscle weakness    PHYSICAL EXAM: Pt in no acute distress  RECENT LABS AND TESTS: BMET    Component Value Date/Time   NA 139 04/02/2019 1431   K 4.2 04/02/2019 1431   CL 105 04/02/2019 1431   CO2 27 04/02/2019 1431   GLUCOSE 88 04/02/2019 1431   BUN 17 04/02/2019 1431   CREATININE 0.77 04/02/2019 1431   CALCIUM 10.0 04/02/2019 1431   GFRNONAA >60 03/09/2011 0600   GFRAA >60 03/09/2011 0600   Lab Results  Component Value Date   HGBA1C 5.4 12/30/2018   Lab Results  Component Value Date   INSULIN 16.7 12/30/2018   CBC    Component Value Date/Time   WBC 6.1 04/02/2019 1431   RBC 4.59 04/02/2019 1431   HGB 13.2 04/02/2019 1431   HCT 38.7 04/02/2019 1431   PLT 209.0 04/02/2019 1431   MCV 84.3 04/02/2019 1431   MCH 29.4 03/09/2011 0600   MCHC 34.0 04/02/2019 1431   RDW 13.7 04/02/2019 1431   LYMPHSABS 2.8 04/02/2019 1431   MONOABS 0.4 04/02/2019 1431   EOSABS 0.1 04/02/2019 1431   BASOSABS 0.0 04/02/2019 1431   Iron/TIBC/Ferritin/ %Sat No results found for: IRON, TIBC, FERRITIN, IRONPCTSAT Lipid Panel     Component Value Date/Time   CHOL 193 04/02/2019 1431   CHOL 204 (H) 12/30/2018 1210   TRIG 81.0 04/02/2019 1431   HDL 52.00 04/02/2019 1431   HDL 67 12/30/2018 1210   CHOLHDL 4 04/02/2019 1431   VLDL 16.2 04/02/2019 1431    LDLCALC 125 (H) 04/02/2019 1431   LDLCALC 121 (H) 12/30/2018 1210   Hepatic Function Panel     Component Value Date/Time   PROT 7.1 04/02/2019 1431   ALBUMIN 4.8 04/02/2019 1431   AST 24 04/02/2019 1431   ALT 38 (H) 04/02/2019 1431   ALKPHOS 64 04/02/2019 1431   BILITOT 0.3 04/02/2019 1431   BILIDIR 0.1 04/02/2019 1431      Component Value Date/Time   TSH 0.37 04/02/2019 1431   TSH 0.75 10/12/2018 1614   TSH 0.27 (L) 05/13/2018 1550      I, Trixie Dredge, am acting as transcriptionist for Ilene Qua, MD  I have reviewed the above documentation for accuracy and completeness, and I agree with the above. - Ilene Qua, MD

## 2019-06-02 ENCOUNTER — Other Ambulatory Visit: Payer: Self-pay

## 2019-06-02 ENCOUNTER — Ambulatory Visit (INDEPENDENT_AMBULATORY_CARE_PROVIDER_SITE_OTHER): Payer: 59 | Admitting: Bariatrics

## 2019-06-02 ENCOUNTER — Encounter: Payer: Self-pay | Admitting: Bariatrics

## 2019-06-02 VITALS — BP 98/66 | HR 78 | Temp 97.9°F | Ht 61.0 in | Wt 149.0 lb

## 2019-06-02 DIAGNOSIS — E559 Vitamin D deficiency, unspecified: Secondary | ICD-10-CM

## 2019-06-02 DIAGNOSIS — R5383 Other fatigue: Secondary | ICD-10-CM | POA: Diagnosis not present

## 2019-06-02 DIAGNOSIS — Z6830 Body mass index (BMI) 30.0-30.9, adult: Secondary | ICD-10-CM

## 2019-06-02 DIAGNOSIS — Z9189 Other specified personal risk factors, not elsewhere classified: Secondary | ICD-10-CM | POA: Diagnosis not present

## 2019-06-02 DIAGNOSIS — E8881 Metabolic syndrome: Secondary | ICD-10-CM

## 2019-06-02 DIAGNOSIS — R0602 Shortness of breath: Secondary | ICD-10-CM

## 2019-06-02 DIAGNOSIS — E669 Obesity, unspecified: Secondary | ICD-10-CM

## 2019-06-02 MED ORDER — METFORMIN HCL 500 MG PO TABS: 500.0000 mg | ORAL_TABLET | Freq: Every day | ORAL | 0 refills | Status: DC

## 2019-06-02 MED ORDER — VITAMIN D (ERGOCALCIFEROL) 1.25 MG (50000 UNIT) PO CAPS: 50000.0000 [IU] | ORAL_CAPSULE | ORAL | 0 refills | Status: DC

## 2019-06-03 ENCOUNTER — Encounter (INDEPENDENT_AMBULATORY_CARE_PROVIDER_SITE_OTHER): Payer: Self-pay | Admitting: Bariatrics

## 2019-06-03 NOTE — Progress Notes (Signed)
Office: [phone]  /  Fax: [phone]   HPI:   Chief Complaint: OBESITY Tracy Brown is here to discuss her progress with her obesity treatment plan. She is on the Category 1 plan and is following her eating plan approximately 95% of the time. She states she is walking 45 minutes 3 times per week and riding pedal bike 30 minutes 5 times per week. Tracy Brown is down 19 lbs from her last in-office visit in June. Her weight is 149 lb (67.6 kg) today and has had a weight loss of 19 pounds over a period of 4 months since her last in-office visit. She has lost 19 lbs since starting treatment with Tracy Brown.  Vitamin D deficiency Tracy Brown has a diagnosis of Vitamin D deficiency. She is currently taking prescription Vit D and denies nausea, vomiting or muscle weakness.  At risk for osteopenia and osteoporosis Tracy Brown is at higher risk of osteopenia and osteoporosis due to Vitamin D deficiency.   Insulin Resistance Tracy Brown has a diagnosis of insulin resistance based on her elevated fasting insulin level >5. Last A1c 5.4 on 12/30/2018 with an insulin of 16.7. Although Tracy Brown's blood glucose readings are still under good control, insulin resistance puts her at greater risk of metabolic syndrome and diabetes. She is taking metformin currently and continues to work on diet and exercise to decrease risk of diabetes. No polyphagia.  Fatigue and Shortness of Breath Tracy Brown notes increasing fatigue and shortness of breath with certain activities. Tracy Brown denies shortness of breath at rest or orthopnea.  ASSESSMENT AND PLAN:  Vitamin D deficiency - Plan: Vitamin D, Ergocalciferol, (DRISDOL) 1.25 MG (50000 UT) CAPS capsule  Other fatigue  Shortness of breath on exertion  Insulin resistance - Plan: metFORMIN (GLUCOPHAGE) 500 MG tablet  At risk for osteoporosis  Class 1 obesity with serious comorbidity and body mass index (BMI) of 30.0 to 30.9 in adult, unspecified obesity type  PLAN:  Vitamin D Deficiency Tracy Brown was  informed that low Vitamin D levels contributes to fatigue and are associated with obesity, breast, and colon cancer. She agrees to continue to take prescription Vit D @ 50,000 IU every week #4 with 0 refills and will follow-up for routine testing of Vitamin D, at least 2-3 times per year. She was informed of the risk of over-replacement of Vitamin D and agrees to not increase her dose unless she discusses this with Tracy Brown first. Tracy Brown agrees to follow-up with our clinic in 4 weeks.  At risk for osteopenia and osteoporosis Tracy Brown was given extended  (15 minutes) osteoporosis prevention counseling today. Tracy Brown is at risk for osteopenia and osteoporosis due to her Vitamin D deficiency. She was encouraged to take her Vitamin D and follow her higher calcium diet and increase strengthening exercise to help strengthen her bones and decrease her risk of osteopenia and osteoporosis.  Insulin Resistance Tracy Brown will continue to work on weight loss, exercise, and decreasing simple carbohydrates in her diet to help decrease the risk of diabetes. We dicussed metformin including benefits and risks. She was informed that eating too many simple carbohydrates or too many calories at one sitting increases the likelihood of GI side effects. Tracy Brown was given a prescription for metformin 500 mg 1 PO daily #30 with 0 refills and agrees to follow-up with our clinic in 4 weeks. She will try to eat earlier.  Fatigue and Shortness of Breath Tracy Brown's shortness of breath appears to be obesity related and exercise induced. The indirect calorimeter results showed VO2 of 203 and a  REE of 1415 (previous IC 1218). She has agreed to work on weight loss and gradually increase exercise to treat her exercise induced shortness of breath. If Tracy Brown follows our instructions and loses weight without improvement of her shortness of breath, we will plan to refer to pulmonology. Tracy Brown agrees to this plan.  Obesity Tracy Brown is currently in the action stage of  change. As such, her goal is to continue with weight loss efforts. She has agreed to follow the Category 1 plan + 100 calories. Tracy Brown will work on meal planning and intentional eating. Tracy Brown has been instructed to work up to a goal of 150 minutes of combined cardio and strengthening exercise per week for weight loss and overall health benefits. We discussed the following Behavioral Modification Strategies today: increasing lean protein intake, decreasing simple carbohydrates, increasing vegetables, increase H20 intake, decrease eating out, no skipping meals, work on meal planning and easy cooking plans, keeping healthy foods in the home, and planning for success.  Tracy Brown has agreed to follow-up with our clinic in 4 weeks. She was informed of the importance of frequent follow-up visits to maximize her success with intensive lifestyle modifications for her multiple health conditions.  ALLERGIES: Allergies  Allergen Reactions   Hydrocodone    Phentermine Itching   Zithromax [Azithromycin Dihydrate]     MEDICATIONS: Current Outpatient Medications on File Prior to Visit  Medication Sig Dispense Refill   Ascorbic Acid (VITAMIN C) 1000 MG tablet Take 1,000 mg by mouth daily.     aspirin 81 MG chewable tablet Chew 81 mg by mouth daily.     cetirizine (ZYRTEC) 10 MG tablet Take 10 mg by mouth daily.     ibuprofen (ADVIL,MOTRIN) 200 MG tablet Take 200 mg by mouth every 6 (six) hours as needed.     levothyroxine (SYNTHROID) 100 MCG tablet Take 1 tablet (100 mcg total) by mouth daily. 90 tablet 3   MULTIPLE VITAMINS PO Take by mouth.     terbinafine (LAMISIL) 250 MG tablet Take 1 tablet (250 mg total) by mouth daily. 90 tablet 1   calcium carbonate (OSCAL) 1500 (600 Ca) MG TABS tablet Take 800 tablets by mouth daily with breakfast.      No current facility-administered medications on file prior to visit.     PAST MEDICAL HISTORY: Past Medical History:  Diagnosis Date   Allergy     Anemia    Chickenpox    Colon polyps    Diverticulosis    Heart murmur    History of recurrent UTIs    Hyperlipidemia    Hypertension    Hypothyroid    Nephrolithiasis    OA (osteoarthritis)    Serrated adenoma of colon 02/2017    PAST SURGICAL HISTORY: Past Surgical History:  Procedure Laterality Date   APPENDECTOMY     BREAST CYST ASPIRATION Right    COLON SURGERY  August 2012   right colectomy, per Dr. Judeth Horn, for tubular  adenoma    COLONOSCOPY  02/28/2017   per Dr. Collene Mares, sessile serrated polyps, repeat in 5 years    [place]  03-08-11   per Dr. Judeth Horn, for tubulovillous adenoma   TONSILLECTOMY      SOCIAL HISTORY: Social History   Tobacco Use   Smoking status: Former Smoker    Packs/day: 1.00    Years: 37.00    Pack years: 37.00    Quit date: 10/10/2016  Years since quitting: 2.6   Smokeless tobacco: Never Used  Substance Use Topics   Alcohol use: Yes    Alcohol/week: 0.0 standard drinks    Comment: once a month   Drug use: No    FAMILY HISTORY: Family History  Problem Relation Age of Onset   Cancer Other        breast,colon,ovarian   Coronary artery disease Other    Hypertension Other    Stroke Other    Leukemia Father    Cancer Father    Breast cancer Maternal Grandfather    Breast cancer Cousin    Thyroid disease Mother    Hypertension Mother    Stroke Mother    ROS: Review of Systems  Constitutional: Positive for malaise/fatigue.  Respiratory: Positive for shortness of breath.   Gastrointestinal: Negative for nausea and vomiting.  Musculoskeletal:       Negative for muscle weakness.  Endo/Heme/Allergies:       Negative for polyphagia.   PHYSICAL EXAM: Blood pressure 98/66, pulse 78, temperature 97.9 F (36.6 C), temperature source Oral, height 5\' 1"  (1.549 m), weight 149 lb (67.6 kg), SpO2 97 %. Body mass index is 28.15 kg/m. Physical  Exam Vitals signs reviewed.  Constitutional:      Appearance: Normal appearance. She is obese.  Cardiovascular:     Rate and Rhythm: Normal rate.     Pulses: Normal pulses.  Pulmonary:     Effort: Pulmonary effort is normal.     Breath sounds: Normal breath sounds.  Musculoskeletal: Normal range of motion.  Skin:    General: Skin is warm and dry.  Neurological:     Mental Status: She is alert and oriented to person, place, and time.  Psychiatric:        Behavior: Behavior normal.   RECENT LABS AND TESTS: BMET    Component Value Date/Time   NA 139 04/02/2019 1431   K 4.2 04/02/2019 1431   CL 105 04/02/2019 1431   CO2 27 04/02/2019 1431   GLUCOSE 88 04/02/2019 1431   BUN 17 04/02/2019 1431   CREATININE 0.77 04/02/2019 1431   CALCIUM 10.0 04/02/2019 1431   GFRNONAA >60 03/09/2011 0600   GFRAA >60 03/09/2011 0600   Lab Results  Component Value Date   HGBA1C 5.4 12/30/2018   Lab Results  Component Value Date   INSULIN 16.7 12/30/2018   CBC    Component Value Date/Time   WBC 6.1 04/02/2019 1431   RBC 4.59 04/02/2019 1431   HGB 13.2 04/02/2019 1431   HCT 38.7 04/02/2019 1431   PLT 209.0 04/02/2019 1431   MCV 84.3 04/02/2019 1431   MCH 29.4 03/09/2011 0600   MCHC 34.0 04/02/2019 1431   RDW 13.7 04/02/2019 1431   LYMPHSABS 2.8 04/02/2019 1431   MONOABS 0.4 04/02/2019 1431   EOSABS 0.1 04/02/2019 1431   BASOSABS 0.0 04/02/2019 1431   Iron/TIBC/Ferritin/ %Sat No results found for: IRON, TIBC, FERRITIN, IRONPCTSAT Lipid Panel     Component Value Date/Time   CHOL 193 04/02/2019 1431   CHOL 204 (H) 12/30/2018 1210   TRIG 81.0 04/02/2019 1431   HDL 52.00 04/02/2019 1431   HDL 67 12/30/2018 1210   CHOLHDL 4 04/02/2019 1431   VLDL 16.2 04/02/2019 1431   LDLCALC 125 (H) 04/02/2019 1431   LDLCALC 121 (H) 12/30/2018 1210   Hepatic Function Panel     Component Value Date/Time   PROT 7.1 04/02/2019 1431   ALBUMIN 4.8 04/02/2019 1431   AST 24  04/02/2019 1431    ALT 38 (H) 04/02/2019 1431   ALKPHOS 64 04/02/2019 1431   BILITOT 0.3 04/02/2019 1431   BILIDIR 0.1 04/02/2019 1431      Component Value Date/Time   TSH 0.37 04/02/2019 1431   TSH 0.75 10/12/2018 1614   TSH 0.27 (L) 05/13/2018 1550   Results for KARISSMA, CATHER (MRN [id number]) as of 06/03/2019 10:30  Ref. Range 10/12/2018 16:14  VITD Latest Ref Range: 30.00 - 100.00 ng/mL 35.59   OBESITY BEHAVIORAL INTERVENTION VISIT  Today's visit was #8  Starting weight: 168 lbs Starting date: 12/30/2018 Today's weight: 149 lbs  Today's date: 06/02/2019 Total lbs lost to date: 19    06/02/2019  Height 5\' 1"  (1.549 m)  Weight 149 lb (67.6 kg)  BMI (Calculated) 28.17  BLOOD PRESSURE - SYSTOLIC 98  BLOOD PRESSURE - DIASTOLIC 66   Body Fat % 39 %  Total Body Water (lbs) 65 lbs  RMR 1415   ASK: We discussed the diagnosis of obesity with Tracy Brown today and Yasha agreed to give Tracy Brown permission to discuss obesity behavioral modification therapy today.  ASSESS: Tanish has the diagnosis of obesity and her BMI today is 28.2. Babita is in the action stage of change.   ADVISE: Chelise was educated on the multiple health risks of obesity as well as the benefit of weight loss to improve her health. She was advised of the need for long term treatment and the importance of lifestyle modifications to improve her current health and to decrease her risk of future health problems.  AGREE: Multiple dietary modification options and treatment options were discussed and  Cira agreed to follow the recommendations documented in the above note.  ARRANGE: Tyrianna was educated on the importance of frequent visits to treat obesity as outlined per CMS and USPSTF guidelines and agreed to schedule her next follow up appointment today.  Migdalia Dk, am acting as Location manager for [employer], DO  I have reviewed the above documentation for accuracy and completeness, and I agree with the above. -Jearld Lesch,  DO

## 2019-06-28 ENCOUNTER — Encounter (INDEPENDENT_AMBULATORY_CARE_PROVIDER_SITE_OTHER): Payer: Self-pay | Admitting: Bariatrics

## 2019-06-29 ENCOUNTER — Telehealth (INDEPENDENT_AMBULATORY_CARE_PROVIDER_SITE_OTHER): Payer: 59 | Admitting: Bariatrics

## 2019-06-29 ENCOUNTER — Other Ambulatory Visit: Payer: Self-pay

## 2019-06-29 ENCOUNTER — Encounter (INDEPENDENT_AMBULATORY_CARE_PROVIDER_SITE_OTHER): Payer: Self-pay | Admitting: Bariatrics

## 2019-06-29 DIAGNOSIS — E669 Obesity, unspecified: Secondary | ICD-10-CM | POA: Diagnosis not present

## 2019-06-29 DIAGNOSIS — Z6834 Body mass index (BMI) 34.0-34.9, adult: Secondary | ICD-10-CM

## 2019-06-29 DIAGNOSIS — I10 Essential (primary) hypertension: Secondary | ICD-10-CM

## 2019-06-29 DIAGNOSIS — E559 Vitamin D deficiency, unspecified: Secondary | ICD-10-CM

## 2019-06-29 MED ORDER — VITAMIN D (ERGOCALCIFEROL) 1.25 MG (50000 UNIT) PO CAPS: 50000.0000 [IU] | ORAL_CAPSULE | ORAL | 0 refills | Status: DC

## 2019-06-29 NOTE — Progress Notes (Signed)
Office: [phone]  /  Fax: [phone] TeleHealth Visit:  Tracy Brown has verbally consented to this TeleHealth visit today. The patient is located at home, the provider is located at the [facility] office. The participants in this visit include the listed provider and patient. The visit was conducted today via FaceTime.  HPI:   Chief Complaint: OBESITY Tracy Brown is here to discuss her progress with her obesity treatment plan. She is on the Category 1 plan and is following her eating plan approximately 98% of the time. She states she is exercising 0 minutes 0 times per week. Tracy Brown has lost 1 lb (weight 148). Her goal is 130-135 lbs. She is using some substitutions for meat.  We were unable to weigh the patient today for this TeleHealth visit. She feels as if she has lost 1 lb since her last visit. She has lost 19 lbs since starting treatment with [name].  Hypertension Tracy Brown is a 58 y.o. female with hypertension, which is well controlled on no medications.  Tracy Brown denies chest pain or shortness of breath on exertion. She is working weight loss to help control her blood pressure with the goal of decreasing her risk of heart attack and stroke.  Vitamin D deficiency Tracy Brown has a diagnosis of Vitamin D deficiency. She is currently taking prescription Vit D and denies nausea, vomiting or muscle weakness.  ASSESSMENT AND PLAN:  Vitamin D deficiency - Plan: Vitamin D, Ergocalciferol, (DRISDOL) 1.25 MG (50000 UT) CAPS capsule  Essential hypertension  Class 1 obesity with serious comorbidity and body mass index (BMI) of 34.0 to 34.9 in adult, unspecified obesity type  PLAN:  Hypertension We discussed sodium restriction, working on healthy weight loss, and a regular exercise program as the means to achieve improved blood pressure control. Tracy Brown agreed with this plan and agreed to follow up as directed. We will continue to monitor her blood pressure as well as her  progress with the above lifestyle modifications. She will continue her medications as prescribed and will watch for signs of hypotension as she continues her lifestyle modifications. Tracy Brown will continue to work on weight and increase activity. Will continue to monitor blood pressure.  Vitamin D Deficiency Tracy Brown was informed that low Vitamin D levels contributes to fatigue and are associated with obesity, breast, and colon cancer. She agrees to continue to take prescription Vit D @ 50,000 IU every week #4 with 0 refillsand will follow-up for routine testing of Vitamin D, at least 2-3 times per year. She was informed of the risk of over-replacement of Vitamin D and agrees to not increase her dose unless she discusses this with [name] first. Tracy Brown agrees to follow-up with our clinic in 2-3 weeks.  Obesity Tracy Brown is currently in the action stage of change. As such, her goal is to continue with weight loss efforts. She has agreed to follow the Category 1 plan. Tracy Brown will work on meal planning and intentional eating. Tracy Brown has been instructed to use her Cubii and will begin small weights for weight loss and overall health benefits. We discussed the following Behavioral Modification Strategies today: increasing lean protein intake, decreasing simple carbohydrates, increasing vegetables, increase H20 intake, decrease eating out, no skipping meals, work on meal planning and easy cooking plans, and keeping healthy foods in the home.  Tracy Brown has agreed to follow-up with our clinic in 2-3 weeks. She was informed of the importance of frequent follow-up visits to maximize her success with intensive lifestyle  modifications for her multiple health conditions.  ALLERGIES: Allergies  Allergen Reactions  . Hydrocodone   . Phentermine Itching  . Zithromax [Azithromycin Dihydrate]     MEDICATIONS: Current Outpatient Medications on File Prior to Visit  Medication Sig Dispense Refill  . Ascorbic Acid (VITAMIN C) 1000  MG tablet Take 1,000 mg by mouth daily.    Marland Kitchen aspirin 81 MG chewable tablet Chew 81 mg by mouth daily.    . calcium carbonate (OSCAL) 1500 (600 Ca) MG TABS tablet Take 800 tablets by mouth daily with breakfast.     . cetirizine (ZYRTEC) 10 MG tablet Take 10 mg by mouth daily.    Marland Kitchen ibuprofen (ADVIL,MOTRIN) 200 MG tablet Take 200 mg by mouth every 6 (six) hours as needed.    Marland Kitchen levothyroxine (SYNTHROID) 100 MCG tablet Take 1 tablet (100 mcg total) by mouth daily. 90 tablet 3  . metFORMIN (GLUCOPHAGE) 500 MG tablet Take 1 tablet (500 mg total) by mouth daily with breakfast. 30 tablet 0  . MULTIPLE VITAMINS PO Take by mouth.    . terbinafine (LAMISIL) 250 MG tablet Take 1 tablet (250 mg total) by mouth daily. 90 tablet 1   No current facility-administered medications on file prior to visit.     PAST MEDICAL HISTORY: Past Medical History:  Diagnosis Date  . Allergy   . Anemia   . Chickenpox   . Colon polyps   . Diverticulosis   . Heart murmur   . History of recurrent UTIs   . Hyperlipidemia   . Hypertension   . Hypothyroid   . Nephrolithiasis   . OA (osteoarthritis)   . Serrated adenoma of colon 02/2017    PAST SURGICAL HISTORY: Past Surgical History:  Procedure Laterality Date  . APPENDECTOMY    . BREAST CYST ASPIRATION Right   . COLON SURGERY  August 2012   right colectomy, per Dr. Judeth Horn, for tubular  adenoma   . COLONOSCOPY  02/28/2017   per Dr. Collene Mares, sessile serrated polyps, repeat in 5 years   . HEMORRHOID SURGERY    . HERNIA REPAIR    . 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 TONSILLECTOMY      SOCIAL HISTORY: Social History   Tobacco Use  . Smoking status: Former Smoker    Packs/day: 1.00    Years: 37.00    Pack years: 37.00    Quit date: 10/10/2016    Years since quitting: 2.7  . Smokeless tobacco: Never Used  Substance Use Topics  . Alcohol use: Yes    Alcohol/week: 0.0 standard drinks    Comment: once a month  .  Drug use: No    FAMILY HISTORY: Family History  Problem Relation Age of Onset  . Cancer Other        breast,colon,ovarian  . Coronary artery disease Other   . Hypertension Other   . Stroke Other   . Leukemia Father   . Cancer Father   . Breast cancer Maternal Grandfather   . Breast cancer Cousin   . Thyroid disease Mother   . Hypertension Mother   . Stroke Mother    ROS: Review of Systems  Respiratory: Negative for shortness of breath.   Cardiovascular: Negative for chest pain.  Gastrointestinal: Negative for nausea and vomiting.  Musculoskeletal:       Negative for muscle weakness.   PHYSICAL EXAM: Pt in no acute distress  RECENT LABS AND TESTS: BMET  Component Value Date/Time   NA 139 04/02/2019 1431   K 4.2 04/02/2019 1431   CL 105 04/02/2019 1431   CO2 27 04/02/2019 1431   GLUCOSE 88 04/02/2019 1431   BUN 17 04/02/2019 1431   CREATININE 0.77 04/02/2019 1431   CALCIUM 10.0 04/02/2019 1431   GFRNONAA >60 03/09/2011 0600   GFRAA >60 03/09/2011 0600   Lab Results  Component Value Date   HGBA1C 5.4 12/30/2018   Lab Results  Component Value Date   INSULIN 16.7 12/30/2018   CBC    Component Value Date/Time   WBC 6.1 04/02/2019 1431   RBC 4.59 04/02/2019 1431   HGB 13.2 04/02/2019 1431   HCT 38.7 04/02/2019 1431   PLT 209.0 04/02/2019 1431   MCV 84.3 04/02/2019 1431   MCH 29.4 03/09/2011 0600   MCHC 34.0 04/02/2019 1431   RDW 13.7 04/02/2019 1431   LYMPHSABS 2.8 04/02/2019 1431   MONOABS 0.4 04/02/2019 1431   EOSABS 0.1 04/02/2019 1431   BASOSABS 0.0 04/02/2019 1431   Iron/TIBC/Ferritin/ %Sat No results found for: IRON, TIBC, FERRITIN, IRONPCTSAT Lipid Panel     Component Value Date/Time   CHOL 193 04/02/2019 1431   CHOL 204 (H) 12/30/2018 1210   TRIG 81.0 04/02/2019 1431   HDL 52.00 04/02/2019 1431   HDL 67 12/30/2018 1210   CHOLHDL 4 04/02/2019 1431   VLDL 16.2 04/02/2019 1431   LDLCALC 125 (H) 04/02/2019 1431   LDLCALC 121 (H)  12/30/2018 1210   Hepatic Function Panel     Component Value Date/Time   PROT 7.1 04/02/2019 1431   ALBUMIN 4.8 04/02/2019 1431   AST 24 04/02/2019 1431   ALT 38 (H) 04/02/2019 1431   ALKPHOS 64 04/02/2019 1431   BILITOT 0.3 04/02/2019 1431   BILIDIR 0.1 04/02/2019 1431      Component Value Date/Time   TSH 0.37 04/02/2019 1431   TSH 0.75 10/12/2018 1614   TSH 0.27 (L) 05/13/2018 1550   Results for PEYTEN, DEANGELO (MRN [id number]) as of 06/29/2019 13:41  Ref. Range 10/12/2018 16:14  VITD Latest Ref Range: 30.00 - 100.00 ng/mL 35.59   I, Michaelene Song, am acting as Location manager for [employer], DO  I have reviewed the above documentation for accuracy and completeness, and I agree with the above. -Jearld Lesch, DO

## 2019-07-19 ENCOUNTER — Other Ambulatory Visit (INDEPENDENT_AMBULATORY_CARE_PROVIDER_SITE_OTHER): Payer: Self-pay | Admitting: Bariatrics

## 2019-07-19 DIAGNOSIS — E8881 Metabolic syndrome: Secondary | ICD-10-CM

## 2019-07-20 ENCOUNTER — Telehealth (INDEPENDENT_AMBULATORY_CARE_PROVIDER_SITE_OTHER): Payer: 59 | Admitting: Bariatrics

## 2019-07-20 ENCOUNTER — Encounter (INDEPENDENT_AMBULATORY_CARE_PROVIDER_SITE_OTHER): Payer: Self-pay | Admitting: Bariatrics

## 2019-07-20 ENCOUNTER — Other Ambulatory Visit: Payer: Self-pay

## 2019-07-20 DIAGNOSIS — Z6831 Body mass index (BMI) 31.0-31.9, adult: Secondary | ICD-10-CM | POA: Diagnosis not present

## 2019-07-20 DIAGNOSIS — E8881 Metabolic syndrome: Secondary | ICD-10-CM

## 2019-07-20 DIAGNOSIS — E669 Obesity, unspecified: Secondary | ICD-10-CM

## 2019-07-20 DIAGNOSIS — E038 Other specified hypothyroidism: Secondary | ICD-10-CM | POA: Diagnosis not present

## 2019-07-20 NOTE — Progress Notes (Signed)
Office: [phone]  /  Fax: [phone] TeleHealth Visit:  Tracy Brown has verbally consented to this TeleHealth visit today. The patient is located at home, the provider is located at the [facility] office. The participants in this visit include the listed provider and patient. The visit was conducted today via FaceTime.  HPI:   Chief Complaint: OBESITY Tracy Brown is here to discuss her progress with her obesity treatment plan. She is on the Category 1 plan and is following her eating plan approximately 80% of the time. She states she is walking 30 minutes 4 times per week. Jaisha is up 2 lbs since her last visit. She was out-of-town and did not stick to her diet. We were unable to weigh the patient today for this TeleHealth visit. She feels as if she has gained 2 lbs since her last visit. She has lost 19 lbs since starting treatment with [name].  Hypothyroidism Tracy Brown has a diagnosis of hypothyroidism. She is on Synthroid. She denies hot or cold intolerance or palpitations, but does admit to ongoing fatigue.  Insulin Resistance (Mild) Tracy Brown has a diagnosis of insulin resistance based on her elevated fasting insulin level >5. Last A1c 5.4 on 12/30/2018 with an insulin of 16.7. Although Resha's blood glucose readings are still under good control, insulin resistance puts her at greater risk of metabolic syndrome and diabetes. She continues to work on diet and exercise to decrease risk of diabetes.  ASSESSMENT AND PLAN:  Other specified hypothyroidism  Insulin resistance  Class 1 obesity without serious comorbidity with body mass index (BMI) of 31.0 to 31.9 in adult, unspecified obesity type  PLAN:  Hypothyroidism Tracy Brown was informed of the importance of good thyroid control for overall health and that supertheraputic thyroid levels are dangerous and will not improve weight loss results. Diara will continue her medications. She is aware that keeping her TSH within normal limits  is best for weight loss.  Insulin Resistance (Mild) Tracy Brown will continue to work on weight loss, exercise, and decreasing simple carbohydrates to help decrease the risk of diabetes. Tracy Brown will increase exercise/activity. She will decrease simple carbohydrates and replace with complex carbohydrates and healthy proteins.  Obesity Tracy Brown is currently in the action stage of change. As such, her goal is to continue with weight loss efforts. She has agreed to follow the Category 1 plan. Tracy Brown will work on meal planning, increasing her water and protein intake, and will go to the store. Tracy Brown has been instructed to continue waking and will come up with a plan (treadmill) for weight loss and overall health benefits. We discussed the following Behavioral Modification Strategies today: increasing lean protein intake, decreasing simple carbohydrates, increasing vegetables, increase H20 intake, decrease eating out, no skipping meals, work on meal planning and easy cooking plans, and keeping healthy foods in the home.  Tracy Brown has agreed to follow up with our clinic in 2-4 weeks. She was informed of the importance of frequent follow up visits to maximize her success with intensive lifestyle modifications for her multiple health conditions.  ALLERGIES: Allergies  Allergen Reactions  . Hydrocodone   . Phentermine Itching  . Zithromax [Azithromycin Dihydrate]     MEDICATIONS: Current Outpatient Medications on File Prior to Visit  Medication Sig Dispense Refill  . Ascorbic Acid (VITAMIN C) 1000 MG tablet Take 1,000 mg by mouth daily.    Marland Kitchen aspirin 81 MG chewable tablet Chew 81 mg by mouth daily.    . calcium carbonate (OSCAL) 1500 (600 Ca)  MG TABS tablet Take 800 tablets by mouth daily with breakfast.     . cetirizine (ZYRTEC) 10 MG tablet Take 10 mg by mouth daily.    Marland Kitchen ibuprofen (ADVIL,MOTRIN) 200 MG tablet Take 200 mg by mouth every 6 (six) hours as needed.    Marland Kitchen levothyroxine (SYNTHROID) 100 MCG tablet  Take 1 tablet (100 mcg total) by mouth daily. 90 tablet 3  . metFORMIN (GLUCOPHAGE) 500 MG tablet TAKE 1 TABLET BY MOUTH EVERY DAY WITH BREAKFAST 30 tablet 0  . MULTIPLE VITAMINS PO Take by mouth.    . terbinafine (LAMISIL) 250 MG tablet Take 1 tablet (250 mg total) by mouth daily. 90 tablet 1  . Vitamin D, Ergocalciferol, (DRISDOL) 1.25 MG (50000 UT) CAPS capsule Take 1 capsule (50,000 Units total) by mouth every 7 (seven) days. 4 capsule 0   No current facility-administered medications on file prior to visit.     PAST MEDICAL HISTORY: Past Medical History:  Diagnosis Date  . Allergy   . Anemia   . Chickenpox   . Colon polyps   . Diverticulosis   . Heart murmur   . History of recurrent UTIs   . Hyperlipidemia   . Hypertension   . Hypothyroid   . Nephrolithiasis   . OA (osteoarthritis)   . Serrated adenoma of colon 02/2017    PAST SURGICAL HISTORY: Past Surgical History:  Procedure Laterality Date  . APPENDECTOMY    . BREAST CYST ASPIRATION Right   . COLON SURGERY  August 2012   right colectomy, per Dr. Judeth Horn, for tubular  adenoma   . COLONOSCOPY  02/28/2017   per Dr. Collene Mares, sessile serrated polyps, repeat in 5 years   . HEMORRHOID SURGERY    . HERNIA REPAIR    . 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 TONSILLECTOMY      SOCIAL HISTORY: Social History   Tobacco Use  . Smoking status: Former Smoker    Packs/day: 1.00    Years: 37.00    Pack years: 37.00    Quit date: 10/10/2016    Years since quitting: 2.7  . Smokeless tobacco: Never Used  Substance Use Topics  . Alcohol use: Yes    Alcohol/week: 0.0 standard drinks    Comment: once a month  . Drug use: No    FAMILY HISTORY: Family History  Problem Relation Age of Onset  . Cancer Other        breast,colon,ovarian  . Coronary artery disease Other   . Hypertension Other   . Stroke Other   . Leukemia Father   . Cancer Father   . Breast cancer Maternal  Grandfather   . Breast cancer Cousin   . Thyroid disease Mother   . Hypertension Mother   . Stroke Mother    ROS: Review of Systems  Constitutional: Positive for malaise/fatigue.  Cardiovascular: Negative for palpitations.  Endo/Heme/Allergies:       Negative for hot/cold intolerance.   PHYSICAL EXAM: Pt in no acute distress  RECENT LABS AND TESTS: BMET    Component Value Date/Time   NA 139 04/02/2019 1431   K 4.2 04/02/2019 1431   CL 105 04/02/2019 1431   CO2 27 04/02/2019 1431   GLUCOSE 88 04/02/2019 1431   BUN 17 04/02/2019 1431   CREATININE 0.77 04/02/2019 1431   CALCIUM 10.0 04/02/2019 1431   GFRNONAA >60 03/09/2011 0600   GFRAA >60 03/09/2011 0600   Lab Results  Component Value Date   HGBA1C 5.4 12/30/2018   Lab Results  Component Value Date   INSULIN 16.7 12/30/2018   CBC    Component Value Date/Time   WBC 6.1 04/02/2019 1431   RBC 4.59 04/02/2019 1431   HGB 13.2 04/02/2019 1431   HCT 38.7 04/02/2019 1431   PLT 209.0 04/02/2019 1431   MCV 84.3 04/02/2019 1431   MCH 29.4 03/09/2011 0600   MCHC 34.0 04/02/2019 1431   RDW 13.7 04/02/2019 1431   LYMPHSABS 2.8 04/02/2019 1431   MONOABS 0.4 04/02/2019 1431   EOSABS 0.1 04/02/2019 1431   BASOSABS 0.0 04/02/2019 1431   Iron/TIBC/Ferritin/ %Sat No results found for: IRON, TIBC, FERRITIN, IRONPCTSAT Lipid Panel     Component Value Date/Time   CHOL 193 04/02/2019 1431   CHOL 204 (H) 12/30/2018 1210   TRIG 81.0 04/02/2019 1431   HDL 52.00 04/02/2019 1431   HDL 67 12/30/2018 1210   CHOLHDL 4 04/02/2019 1431   VLDL 16.2 04/02/2019 1431   LDLCALC 125 (H) 04/02/2019 1431   LDLCALC 121 (H) 12/30/2018 1210   Hepatic Function Panel     Component Value Date/Time   PROT 7.1 04/02/2019 1431   ALBUMIN 4.8 04/02/2019 1431   AST 24 04/02/2019 1431   ALT 38 (H) 04/02/2019 1431   ALKPHOS 64 04/02/2019 1431   BILITOT 0.3 04/02/2019 1431   BILIDIR 0.1 04/02/2019 1431      Component Value Date/Time   TSH  0.37 04/02/2019 1431   TSH 0.75 10/12/2018 1614   TSH 0.27 (L) 05/13/2018 1550   Results for REYAH, BRULE (MRN [id number]) as of 07/20/2019 16:48  Ref. Range 10/12/2018 16:14  VITD Latest Ref Range: 30.00 - 100.00 ng/mL 35.59   I, Michaelene Song, am acting as Location manager for [employer], DO  I have reviewed the above documentation for accuracy and completeness, and I agree with the above. -Jearld Lesch, DO

## 2019-07-23 ENCOUNTER — Encounter (INDEPENDENT_AMBULATORY_CARE_PROVIDER_SITE_OTHER): Payer: Self-pay | Admitting: Bariatrics

## 2019-07-28 ENCOUNTER — Telehealth (INDEPENDENT_AMBULATORY_CARE_PROVIDER_SITE_OTHER): Payer: 59 | Admitting: Bariatrics

## 2019-08-02 ENCOUNTER — Other Ambulatory Visit (INDEPENDENT_AMBULATORY_CARE_PROVIDER_SITE_OTHER): Payer: Self-pay | Admitting: Bariatrics

## 2019-08-02 DIAGNOSIS — E559 Vitamin D deficiency, unspecified: Secondary | ICD-10-CM

## 2019-08-03 ENCOUNTER — Encounter (INDEPENDENT_AMBULATORY_CARE_PROVIDER_SITE_OTHER): Payer: Self-pay | Admitting: Bariatrics

## 2019-08-03 ENCOUNTER — Telehealth (INDEPENDENT_AMBULATORY_CARE_PROVIDER_SITE_OTHER): Payer: 59 | Admitting: Bariatrics

## 2019-08-03 ENCOUNTER — Other Ambulatory Visit: Payer: Self-pay

## 2019-08-03 DIAGNOSIS — E88819 Insulin resistance, unspecified: Secondary | ICD-10-CM

## 2019-08-03 DIAGNOSIS — E669 Obesity, unspecified: Secondary | ICD-10-CM | POA: Diagnosis not present

## 2019-08-03 DIAGNOSIS — E66811 Obesity, class 1: Secondary | ICD-10-CM

## 2019-08-03 DIAGNOSIS — E8881 Metabolic syndrome: Secondary | ICD-10-CM

## 2019-08-03 DIAGNOSIS — Z6830 Body mass index (BMI) 30.0-30.9, adult: Secondary | ICD-10-CM

## 2019-08-03 DIAGNOSIS — E559 Vitamin D deficiency, unspecified: Secondary | ICD-10-CM | POA: Diagnosis not present

## 2019-08-03 MED ORDER — METFORMIN HCL 500 MG PO TABS: 500.0000 mg | ORAL_TABLET | Freq: Every day | ORAL | 0 refills | Status: DC

## 2019-08-03 MED ORDER — VITAMIN D (ERGOCALCIFEROL) 1.25 MG (50000 UNIT) PO CAPS: 50000.0000 [IU] | ORAL_CAPSULE | ORAL | 0 refills | Status: DC

## 2019-08-04 NOTE — Progress Notes (Signed)
Office: [phone]  /  Fax: [phone] TeleHealth Visit:  Tracy Brown has verbally consented to this TeleHealth visit today. The patient is located at home, the provider is located at the [facility] office. The participants in this visit include the listed provider and patient. The visit was conducted today via FaceTime.  HPI:  Chief Complaint: OBESITY Tracy Brown is here to discuss her progress with her obesity treatment plan. She is on the Category 1 plan and states she is following her eating plan approximately 80% of the time. She states she is walking 30 minutes 4 times per week.  Tracy Brown states that she had the new shingles vaccine (Shingrix) and feels bad. She states that she is down a couple of lbs.  Today's visit was #11 Starting weight: 168 lbs Starting date: 12/30/2018  Vitamin D deficiency Tracy Brown has a diagnosis of Vitamin D deficiency and is taking prescription Vitamin D. No nausea, vomiting, or muscle weakness.  Insulin Resistance Tracy Brown has a diagnosis of insulin resistance and is taking metformin. Last A1c 5.4 on 12/30/18 with an insulin of 16.7.  ASSESSMENT AND PLAN:  Class 1 obesity with serious comorbidity and body mass index (BMI) of 30.0 to 30.9 in adult, unspecified obesity type  Vitamin D deficiency - Plan: Vitamin D, Ergocalciferol, (DRISDOL) 1.25 MG (50000 UT) CAPS capsule  Insulin resistance  PLAN:  Vitamin D Deficiency Tracy Brown was informed that low Vitamin D levels contributes to fatigue and are associated with obesity, breast, and colon cancer. She agrees to continue to take prescription Vit D @ 50,000 IU every week #4 with 0 refills and will follow-up for routine testing of Vitamin D, at least 2-3 times per year. She was informed of the risk of over-replacement of Vitamin D and agrees to not increase her dose unless she discusses this with [name] first. Mai agrees to follow-up with our clinic in 2-3 weeks.  Insulin Resistance Tracy Brown will  continue to work on weight loss, exercise, and decreasing simple carbohydrates to help decrease the risk of diabetes. Tracy Brown was given a prescription for metformin 500 mg 1 PO daily with breakfast #30 with 0 refills. She agrees to follow-up with our clinic in 2-3 weeks.  Obesity Tracy Brown is currently in the action stage of change. As such, her goal is to continue with weight loss efforts. She has agreed to follow the Category 1 plan. Tracy Brown will work on meal planning, intentional eating, and will get candy out of the house. Tracy Brown has been instructed to continue walking 30 minutes 4 times per week for weight loss and overall health benefits. We discussed the following Behavioral Modification Strategies today: increasing lean protein intake, decreasing simple carbohydrates, increasing vegetables, increase H20 intake, no skipping meals, work on meal planning and easy cooking plans, ways to avoid boredom eating, ways to avoid nighttime snacking, and planning for success.  Tracy Brown has agreed to follow-up with our clinic in 2-3 weeks. She was informed of the importance of frequent follow-up visits to maximize her success with intensive lifestyle modifications for her multiple health conditions.  ALLERGIES: Allergies  Allergen Reactions  . Hydrocodone   . Phentermine Itching  . Zithromax [Azithromycin Dihydrate]     MEDICATIONS: Current Outpatient Medications on File Prior to Visit  Medication Sig Dispense Refill  . Ascorbic Acid (VITAMIN C) 1000 MG tablet Take 1,000 mg by mouth daily.    Tracy Brown aspirin 81 MG chewable tablet Chew 81 mg by mouth daily.    . calcium carbonate (  OSCAL) 1500 (600 Ca) MG TABS tablet Take 800 tablets by mouth daily with breakfast.     . cetirizine (ZYRTEC) 10 MG tablet Take 10 mg by mouth daily.    Tracy Brown ibuprofen (ADVIL,MOTRIN) 200 MG tablet Take 200 mg by mouth every 6 (six) hours as needed.    Tracy Brown levothyroxine (SYNTHROID) 100 MCG tablet Take 1 tablet (100 mcg total) by mouth daily.  90 tablet 3  . MULTIPLE VITAMINS PO Take by mouth.    . terbinafine (LAMISIL) 250 MG tablet Take 1 tablet (250 mg total) by mouth daily. 90 tablet 1   No current facility-administered medications on file prior to visit.    PAST MEDICAL HISTORY: Past Medical History:  Diagnosis Date  . Allergy   . Anemia   . Chickenpox   . Colon polyps   . Diverticulosis   . Heart murmur   . History of recurrent UTIs   . Hyperlipidemia   . Hypertension   . Hypothyroid   . Nephrolithiasis   . OA (osteoarthritis)   . Serrated adenoma of colon 02/2017    PAST SURGICAL HISTORY: Past Surgical History:  Procedure Laterality Date  . APPENDECTOMY    . BREAST CYST ASPIRATION Right   . COLON SURGERY  August 2012   right colectomy, per Dr. Judeth Horn, for tubular  adenoma   . COLONOSCOPY  02/28/2017   per Dr. Collene Mares, sessile serrated polyps, repeat in 5 years   . HEMORRHOID SURGERY    . HERNIA REPAIR    . 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 TONSILLECTOMY      SOCIAL HISTORY: Social History   Tobacco Use  . Smoking status: Former Smoker    Packs/day: 1.00    Years: 37.00    Pack years: 37.00    Quit date: 10/10/2016    Years since quitting: 2.8  . Smokeless tobacco: Never Used  Substance Use Topics  . Alcohol use: Yes    Alcohol/week: 0.0 standard drinks    Comment: once a month  . Drug use: No    FAMILY HISTORY: Family History  Problem Relation Age of Onset  . Cancer Other        breast,colon,ovarian  . Coronary artery disease Other   . Hypertension Other   . Stroke Other   . Leukemia Father   . Cancer Father   . Breast cancer Maternal Grandfather   . Breast cancer Cousin   . Thyroid disease Mother   . Hypertension Mother   . Stroke Mother    ROS: Review of Systems  Gastrointestinal: Negative for nausea and vomiting.  Musculoskeletal:       Negative for muscle weakness.   PHYSICAL EXAM: There were no vitals taken for this  visit. There is no height or weight on file to calculate BMI. Physical Exam: Pt in no acute distress.  RECENT LABS AND TESTS: BMET    Component Value Date/Time   NA 139 04/02/2019 1431   K 4.2 04/02/2019 1431   CL 105 04/02/2019 1431   CO2 27 04/02/2019 1431   GLUCOSE 88 04/02/2019 1431   BUN 17 04/02/2019 1431   CREATININE 0.77 04/02/2019 1431   CALCIUM 10.0 04/02/2019 1431   GFRNONAA >60 03/09/2011 0600   GFRAA >60 03/09/2011 0600   Lab Results  Component Value Date   HGBA1C 5.4 12/30/2018   Lab Results  Component Value Date   INSULIN 16.7 12/30/2018   CBC  Component Value Date/Time   WBC 6.1 04/02/2019 1431   RBC 4.59 04/02/2019 1431   HGB 13.2 04/02/2019 1431   HCT 38.7 04/02/2019 1431   PLT 209.0 04/02/2019 1431   MCV 84.3 04/02/2019 1431   MCH 29.4 03/09/2011 0600   MCHC 34.0 04/02/2019 1431   RDW 13.7 04/02/2019 1431   LYMPHSABS 2.8 04/02/2019 1431   MONOABS 0.4 04/02/2019 1431   EOSABS 0.1 04/02/2019 1431   BASOSABS 0.0 04/02/2019 1431   Iron/TIBC/Ferritin/ %Sat No results found for: IRON, TIBC, FERRITIN, IRONPCTSAT Lipid Panel     Component Value Date/Time   CHOL 193 04/02/2019 1431   CHOL 204 (H) 12/30/2018 1210   TRIG 81.0 04/02/2019 1431   HDL 52.00 04/02/2019 1431   HDL 67 12/30/2018 1210   CHOLHDL 4 04/02/2019 1431   VLDL 16.2 04/02/2019 1431   LDLCALC 125 (H) 04/02/2019 1431   LDLCALC 121 (H) 12/30/2018 1210   Hepatic Function Panel     Component Value Date/Time   PROT 7.1 04/02/2019 1431   ALBUMIN 4.8 04/02/2019 1431   AST 24 04/02/2019 1431   ALT 38 (H) 04/02/2019 1431   ALKPHOS 64 04/02/2019 1431   BILITOT 0.3 04/02/2019 1431   BILIDIR 0.1 04/02/2019 1431      Component Value Date/Time   TSH 0.37 04/02/2019 1431   TSH 0.75 10/12/2018 1614   TSH 0.27 (L) 05/13/2018 1550   I, Michaelene Song, am acting as Location manager for [employer], DO  I have reviewed the above documentation for accuracy and completeness, and I agree  with the above. Jearld Lesch, DO

## 2019-08-10 ENCOUNTER — Other Ambulatory Visit: Payer: Self-pay | Admitting: Family Medicine

## 2019-08-10 DIAGNOSIS — Z1231 Encounter for screening mammogram for malignant neoplasm of breast: Secondary | ICD-10-CM

## 2019-08-17 ENCOUNTER — Other Ambulatory Visit: Payer: Self-pay

## 2019-08-17 ENCOUNTER — Ambulatory Visit
Admission: RE | Admit: 2019-08-17 | Discharge: 2019-08-17 | Disposition: A | Discharge status: Home / Self Care | Payer: 59 | Source: Ambulatory Visit | Attending: Family Medicine | Admitting: Family Medicine

## 2019-08-17 DIAGNOSIS — Z1231 Encounter for screening mammogram for malignant neoplasm of breast: Secondary | ICD-10-CM

## 2019-08-26 ENCOUNTER — Telehealth (INDEPENDENT_AMBULATORY_CARE_PROVIDER_SITE_OTHER): Payer: 59 | Admitting: Bariatrics

## 2019-08-26 ENCOUNTER — Other Ambulatory Visit: Payer: Self-pay

## 2019-08-26 ENCOUNTER — Encounter (INDEPENDENT_AMBULATORY_CARE_PROVIDER_SITE_OTHER): Payer: Self-pay | Admitting: Bariatrics

## 2019-08-26 DIAGNOSIS — Z6834 Body mass index (BMI) 34.0-34.9, adult: Secondary | ICD-10-CM | POA: Diagnosis not present

## 2019-08-26 DIAGNOSIS — E559 Vitamin D deficiency, unspecified: Secondary | ICD-10-CM

## 2019-08-26 DIAGNOSIS — E669 Obesity, unspecified: Secondary | ICD-10-CM | POA: Diagnosis not present

## 2019-08-26 DIAGNOSIS — E8881 Metabolic syndrome: Secondary | ICD-10-CM | POA: Diagnosis not present

## 2019-08-26 MED ORDER — VITAMIN D (ERGOCALCIFEROL) 1.25 MG (50000 UNIT) PO CAPS: 50000.0000 [IU] | ORAL_CAPSULE | ORAL | 0 refills | Status: DC

## 2019-08-26 MED ORDER — METFORMIN HCL 500 MG PO TABS: 500.0000 mg | ORAL_TABLET | Freq: Every day | ORAL | 0 refills | Status: DC

## 2019-08-30 NOTE — Progress Notes (Signed)
TeleHealth Visit:  Due to the COVID-19 pandemic, this visit was completed with telemedicine (audio/video) technology to reduce patient and provider exposure as well as to preserve personal protective equipment.   Natalin Aviyanna Pizzolato has verbally consented to this TeleHealth visit. The patient is located at home, the provider is located at the [facility] office. The participants in this visit include the listed provider and patient. The visit was conducted today via FaceTime.  Chief Complaint: OBESITY Madelon is here to discuss her progress with her obesity treatment plan along with follow-up of her obesity related diagnoses. Rosland is on the Category 1 Plan and states she is following her eating plan approximately 80% of the time. Dakari states she is exercising 0 minutes 0 times per week.  Today's visit was #: 12 Starting weight: 168 lbs Starting date: 12/30/2018  Interim History: Malie states that she is down a few lbs. "She messed up over the holidays (more sweets)." She reports not getting enough water.  Subjective:   Insulin resistance. Angalina has a diagnosis of insulin resistance based on her elevated fasting insulin level >5. She continues to work on diet and exercise to decrease her risk of diabetes. No polyphagia. Appetite is normal.  Lab Results  Component Value Date   INSULIN 16.7 12/30/2018   Lab Results  Component Value Date   HGBA1C 5.4 12/30/2018   Vitamin D deficiency. Rachyl reports minimal sun exposure.  Assessment/Plan:   Insulin resistance. Malak will continue to work on weight loss, exercise, and decreasing simple carbohydrates to help decrease the risk of diabetes. Yalitza agreed to follow-up with [name] as directed to closely monitor her progress. She was given a prescription for metformin 500 mg 1 PO daily with breakfast #30 with 0 refills.  Vitamin D deficiency. Low Vitamin D level contributes to fatigue and are associated with obesity, breast, and  colon cancer. She agrees to take prescription Vitamin D, Ergocalciferol, (DRISDOL) 1.25 MG (50000 UNIT) CAPS capsule every week #4 with 0 refills and will follow-up for routine testing of Vitamin D, at least 2-3 times per year to avoid over-replacement.  Class 1 obesity with serious comorbidity and body mass index (BMI) of 34.0 to 34.9 in adult, unspecified obesity type.  Rayhana is currently in the action stage of change. As such, her goal is to continue with weight loss efforts. She has agreed to the Category 1 Plan.   She will continue stronger adherence to the plan, continue to weigh herself at home weekly, and will increase her water intake.  Exercise goals: Malanna will get back to walking.  Behavioral modification strategies: decreasing simple carbohydrates, increasing high fiber foods, decreasing eating out, no skipping meals, meal planning and cooking strategies, keeping healthy foods in the home, better snacking choices and planning for success.  Jonnell has agreed to follow-up with our clinic in 2 weeks. She was informed of the importance of frequent follow-up visits to maximize her success with intensive lifestyle modifications for her multiple health conditions.  Objective:   VITALS: Per patient if applicable, see vitals. GENERAL: Alert and in no acute distress. CARDIOPULMONARY: No increased WOB. Speaking in clear sentences.  PSYCH: Pleasant and cooperative. Speech normal rate and rhythm. Affect is appropriate. Insight and judgement are appropriate. Attention is focused, linear, and appropriate.  NEURO: Oriented as arrived to appointment on time with no prompting.   Lab Results  Component Value Date   CREATININE 0.77 04/02/2019   BUN 17 04/02/2019   NA  139 04/02/2019   K 4.2 04/02/2019   CL 105 04/02/2019   CO2 27 04/02/2019   Lab Results  Component Value Date   ALT 38 (H) 04/02/2019   AST 24 04/02/2019   ALKPHOS 64 04/02/2019   BILITOT 0.3 04/02/2019   Lab Results    Component Value Date   HGBA1C 5.4 12/30/2018   Lab Results  Component Value Date   INSULIN 16.7 12/30/2018   Lab Results  Component Value Date   TSH 0.37 04/02/2019   Lab Results  Component Value Date   CHOL 193 04/02/2019   HDL 52.00 04/02/2019   LDLCALC 125 (H) 04/02/2019   TRIG 81.0 04/02/2019   CHOLHDL 4 04/02/2019   Lab Results  Component Value Date   WBC 6.1 04/02/2019   HGB 13.2 04/02/2019   HCT 38.7 04/02/2019   MCV 84.3 04/02/2019   PLT 209.0 04/02/2019   No results found for: IRON, TIBC, FERRITIN  Attestation Statements:   Reviewed by clinician on day of visit: allergies, medications, problem list, medical history, surgical history, family history, social history, and previous encounter notes.  Migdalia Dk, am acting as Location manager for [employer], DO   I have reviewed the above documentation for accuracy and completeness, and I agree with the above. Jearld Lesch, DO

## 2019-09-03 ENCOUNTER — Other Ambulatory Visit: Payer: Self-pay | Admitting: Family Medicine

## 2019-09-14 ENCOUNTER — Telehealth (INDEPENDENT_AMBULATORY_CARE_PROVIDER_SITE_OTHER): Payer: 59 | Admitting: Bariatrics

## 2019-09-14 ENCOUNTER — Other Ambulatory Visit: Payer: Self-pay

## 2019-09-14 ENCOUNTER — Encounter (INDEPENDENT_AMBULATORY_CARE_PROVIDER_SITE_OTHER): Payer: Self-pay | Admitting: Bariatrics

## 2019-09-14 DIAGNOSIS — I10 Essential (primary) hypertension: Secondary | ICD-10-CM

## 2019-09-14 DIAGNOSIS — E669 Obesity, unspecified: Secondary | ICD-10-CM | POA: Diagnosis not present

## 2019-09-14 DIAGNOSIS — E038 Other specified hypothyroidism: Secondary | ICD-10-CM

## 2019-09-14 DIAGNOSIS — Z6830 Body mass index (BMI) 30.0-30.9, adult: Secondary | ICD-10-CM

## 2019-09-14 NOTE — Progress Notes (Signed)
TeleHealth Visit:  Due to the COVID-19 pandemic, this visit was completed with telemedicine (audio/video) technology to reduce patient and provider exposure as well as to preserve personal protective equipment.   Tracy Brown has verbally consented to this TeleHealth visit. The patient is located at home, the provider is located at the [facility] office. The participants in this visit include the listed provider and patient. The visit was conducted today via FaceTime.  Chief Complaint: OBESITY Tracy Brown is here to discuss her progress with her obesity treatment plan along with follow-up of her obesity related diagnoses. Tracy Brown is on the Category 1 Plan and states she is following her eating plan approximately 90% of the time. Tracy Brown states she is walking 30 minutes 3 times per week.  Today's visit was #: 13 Starting weight: 168 lbs Starting date: 12/30/2018  Interim History: Tracy Brown states that her weight remains the same (weight 145). She states her appetite is normal.  Subjective:   Essential hypertension. Tracy Brown is on no medications. Hypertension is well controlled.  BP Readings from Last 3 Encounters:  06/02/19 98/66  04/02/19 120/62  01/13/19 118/82   Lab Results  Component Value Date   CREATININE 0.77 04/02/2019   CREATININE 0.77 10/12/2018   CREATININE 0.75 02/06/2018   Other specified hypothyroidism. Tracy Brown is taking Synthroid. She reports her energy is okay.   Lab Results  Component Value Date   TSH 0.37 04/02/2019   Assessment/Plan:   Essential hypertension. Tracy Brown is working on healthy weight loss and exercise to improve blood pressure control. We will watch for signs of hypotension as she continues her lifestyle modifications. She will continue weight loss and exercise. She will decrease adding salt to her diet.  Other specified hypothyroidism. Patient with long-standing hypothyroidism, on levothyroxine therapy. She appears euthyroid. Orders and  follow up as documented in patient record. Tracy Brown will continue her Synthroid as prescribed.  Counseling . Good thyroid control is important for overall health. Supratherapeutic thyroid levels are dangerous and will not improve weight loss results. . The correct way to take levothyroxine is fasting, with water, separated by at least 30 minutes from breakfast, and separated by more than 4 hours from calcium, iron, multivitamins, acid reflux medications (PPIs).   Class 1 obesity with serious comorbidity and body mass index (BMI) of 30.0 to 30.9 in adult, unspecified obesity type.  Tracy Brown is currently in the action stage of change. As such, her goal is to continue with weight loss efforts. She has agreed to the Category 1 Plan and journal 300-400 calories and 35+ grams of protein at supper.  She will work on meal planning, intentional eating, and will add in some almonds (100 calories).  Exercise goals: Tracy Brown will continue walking 30 minutes 3 days a week.  Behavioral modification strategies: increasing lean protein intake, decreasing simple carbohydrates, increasing vegetables, increasing water intake, decreasing eating out, no skipping meals, meal planning and cooking strategies, keeping healthy foods in the home and planning for success.  Tracy Brown has agreed to follow-up with our clinic in 2 weeks. She was informed of the importance of frequent follow-up visits to maximize her success with intensive lifestyle modifications for her multiple health conditions.  Objective:   VITALS: Per patient if applicable, see vitals. GENERAL: Alert and in no acute distress. CARDIOPULMONARY: No increased WOB. Speaking in clear sentences.  PSYCH: Pleasant and cooperative. Speech normal rate and rhythm. Affect is appropriate. Insight and judgement are appropriate. Attention is focused, linear, and appropriate.  NEURO: Oriented as arrived to appointment on time with no prompting.   Lab Results  Component Value  Date   CREATININE 0.77 04/02/2019   BUN 17 04/02/2019   NA 139 04/02/2019   K 4.2 04/02/2019   CL 105 04/02/2019   CO2 27 04/02/2019   Lab Results  Component Value Date   ALT 38 (H) 04/02/2019   AST 24 04/02/2019   ALKPHOS 64 04/02/2019   BILITOT 0.3 04/02/2019   Lab Results  Component Value Date   HGBA1C 5.4 12/30/2018   Lab Results  Component Value Date   INSULIN 16.7 12/30/2018   Lab Results  Component Value Date   TSH 0.37 04/02/2019   Lab Results  Component Value Date   CHOL 193 04/02/2019   HDL 52.00 04/02/2019   LDLCALC 125 (H) 04/02/2019   TRIG 81.0 04/02/2019   CHOLHDL 4 04/02/2019   Lab Results  Component Value Date   WBC 6.1 04/02/2019   HGB 13.2 04/02/2019   HCT 38.7 04/02/2019   MCV 84.3 04/02/2019   PLT 209.0 04/02/2019   No results found for: IRON, TIBC, FERRITIN  Attestation Statements:   Reviewed by clinician on day of visit: allergies, medications, problem list, medical history, surgical history, family history, social history, and previous encounter notes.  Time spent on visit including pre-visit chart review and post-visit care was 20 minutes.   Migdalia Dk, am acting as Location manager for [employer], DO   I have reviewed the above documentation for accuracy and completeness, and I agree with the above. Jearld Lesch, DO

## 2019-09-28 ENCOUNTER — Telehealth (INDEPENDENT_AMBULATORY_CARE_PROVIDER_SITE_OTHER): Payer: 59 | Admitting: Bariatrics

## 2019-09-28 ENCOUNTER — Encounter (INDEPENDENT_AMBULATORY_CARE_PROVIDER_SITE_OTHER): Payer: Self-pay | Admitting: Bariatrics

## 2019-09-28 DIAGNOSIS — Z6830 Body mass index (BMI) 30.0-30.9, adult: Secondary | ICD-10-CM

## 2019-09-28 DIAGNOSIS — E559 Vitamin D deficiency, unspecified: Secondary | ICD-10-CM | POA: Diagnosis not present

## 2019-09-28 DIAGNOSIS — E669 Obesity, unspecified: Secondary | ICD-10-CM

## 2019-09-28 DIAGNOSIS — E038 Other specified hypothyroidism: Secondary | ICD-10-CM

## 2019-09-28 MED ORDER — VITAMIN D (ERGOCALCIFEROL) 1.25 MG (50000 UNIT) PO CAPS: 50000.0000 [IU] | ORAL_CAPSULE | ORAL | 0 refills | Status: DC

## 2019-09-28 NOTE — Progress Notes (Signed)
TeleHealth Visit:  Due to the COVID-19 pandemic, this visit was completed with telemedicine (audio/video) technology to reduce patient and provider exposure as well as to preserve personal protective equipment.   Tracy Brown has verbally consented to this TeleHealth visit. The patient is located at home, the provider is located at the [facility] office. The participants in this visit include the listed provider and patient. The visit was conducted today via FaceTime.  Chief Complaint: OBESITY Tracy Brown is here to discuss her progress with her obesity treatment plan along with follow-up of her obesity related diagnoses. [name] is on the Category 1 Plan and states she is following her eating plan approximately 80% of the time. [name] states she is walking 30 minutes 3 times per week.  Today's visit was #: 14 Starting weight: 168 lbs Starting date: 12/30/2018  Interim History: [name] states that her weight remains the same.   Subjective:   Vitamin D deficiency. No nausea, vomiting, or muscle weakness. Last Vitamin D level 35.59 on 10/12/2018.  Other specified hypothyroidism. Makhia is taking Synthroid. Energy is okay.  Lab Results  Component Value Date   TSH 0.37 04/02/2019   Assessment/Plan:   Vitamin D deficiency. Low Vitamin D level contributes to fatigue and are associated with obesity, breast, and colon cancer. She was given a prescription for Vitamin D, Ergocalciferol, (DRISDOL) 1.25 MG (50000 UNIT) CAPS capsule and will take it every 14 days #2 with 0 refills and will follow-up for routine testing of Vitamin D, at least 2-3 times per year to avoid over-replacement.    Other specified hypothyroidism. Patient with long-standing hypothyroidism, on levothyroxine therapy. She appears euthyroid. Orders and follow up as documented in patient record. [name] will continue Synthroid as directed.  Counseling . Good thyroid control is important for overall health.  Supratherapeutic thyroid levels are dangerous and will not improve weight loss results. . The correct way to take levothyroxine is fasting, with water, separated by at least 30 minutes from breakfast, and separated by more than 4 hours from calcium, iron, multivitamins, acid reflux medications (PPIs).   Class 1 obesity with serious comorbidity and body mass index (BMI) of 30.0 to 30.9 in adult, unspecified obesity type.  [name] is currently in the action stage of change. As such, her goal is to continue with weight loss efforts. She has agreed to the Category 1 Plan.   She will work on meal planning, intentional eating, will be more adherent to the plan, and will increase her water intake.  Exercise goals: [name] will continue to walk and use [employer].  Behavioral modification strategies: increasing lean protein intake, decreasing simple carbohydrates, increasing vegetables, increasing water intake, decreasing eating out, no skipping meals, meal planning and cooking strategies, keeping healthy foods in the home and planning for success.  [name] has agreed to follow-up with our clinic in 2 weeks. She was informed of the importance of frequent follow-up visits to maximize her success with intensive lifestyle modifications for her multiple health conditions.  Objective:   VITALS: Per patient if applicable, see vitals. GENERAL: Alert and in no acute distress. CARDIOPULMONARY: No increased WOB. Speaking in clear sentences.  PSYCH: Pleasant and cooperative. Speech normal rate and rhythm. Affect is appropriate. Insight and judgement are appropriate. Attention is focused, linear, and appropriate.  NEURO: Oriented as arrived to appointment on time with no prompting.   Lab Results  Component Value Date   CREATININE 0.77 04/02/2019   BUN 17 04/02/2019   NA  139 04/02/2019   K 4.2 04/02/2019   CL 105 04/02/2019   CO2 27 04/02/2019   Lab Results  Component Value Date   ALT 38 (H) 04/02/2019   AST 24  04/02/2019   ALKPHOS 64 04/02/2019   BILITOT 0.3 04/02/2019   Lab Results  Component Value Date   HGBA1C 5.4 12/30/2018   Lab Results  Component Value Date   INSULIN 16.7 12/30/2018   Lab Results  Component Value Date   TSH 0.37 04/02/2019   Lab Results  Component Value Date   CHOL 193 04/02/2019   HDL 52.00 04/02/2019   LDLCALC 125 (H) 04/02/2019   TRIG 81.0 04/02/2019   CHOLHDL 4 04/02/2019   Lab Results  Component Value Date   WBC 6.1 04/02/2019   HGB 13.2 04/02/2019   HCT 38.7 04/02/2019   MCV 84.3 04/02/2019   PLT 209.0 04/02/2019   No results found for: IRON, TIBC, FERRITIN  Attestation Statements:   Reviewed by clinician on day of visit: allergies, medications, problem list, medical history, surgical history, family history, social history, and previous encounter notes.  Migdalia Dk, am acting as Location manager for [employer], DO   I have reviewed the above documentation for accuracy and completeness, and I agree with the above. Jearld Lesch, DO

## 2019-10-12 ENCOUNTER — Encounter (INDEPENDENT_AMBULATORY_CARE_PROVIDER_SITE_OTHER): Payer: Self-pay | Admitting: Bariatrics

## 2019-10-12 ENCOUNTER — Other Ambulatory Visit (INDEPENDENT_AMBULATORY_CARE_PROVIDER_SITE_OTHER): Payer: Self-pay | Admitting: Bariatrics

## 2019-10-12 ENCOUNTER — Telehealth (INDEPENDENT_AMBULATORY_CARE_PROVIDER_SITE_OTHER): Payer: 59 | Admitting: Bariatrics

## 2019-10-12 DIAGNOSIS — E559 Vitamin D deficiency, unspecified: Secondary | ICD-10-CM

## 2019-10-12 MED ORDER — VITAMIN D (ERGOCALCIFEROL) 1.25 MG (50000 UNIT) PO CAPS: 50000.0000 [IU] | ORAL_CAPSULE | ORAL | 0 refills | Status: DC

## 2019-10-12 NOTE — Telephone Encounter (Signed)
Please review

## 2019-10-18 ENCOUNTER — Other Ambulatory Visit (INDEPENDENT_AMBULATORY_CARE_PROVIDER_SITE_OTHER): Payer: Self-pay | Admitting: Bariatrics

## 2019-10-26 ENCOUNTER — Other Ambulatory Visit: Payer: Self-pay

## 2019-10-26 ENCOUNTER — Ambulatory Visit (INDEPENDENT_AMBULATORY_CARE_PROVIDER_SITE_OTHER)
Admission: RE | Admit: 2019-10-26 | Discharge: 2019-10-26 | Disposition: A | Discharge status: Home / Self Care | Payer: 59 | Source: Ambulatory Visit | Attending: Acute Care | Admitting: Acute Care

## 2019-10-26 ENCOUNTER — Encounter (INDEPENDENT_AMBULATORY_CARE_PROVIDER_SITE_OTHER): Payer: Self-pay | Admitting: Bariatrics

## 2019-10-26 ENCOUNTER — Telehealth (INDEPENDENT_AMBULATORY_CARE_PROVIDER_SITE_OTHER): Payer: 59 | Admitting: Bariatrics

## 2019-10-26 DIAGNOSIS — E559 Vitamin D deficiency, unspecified: Secondary | ICD-10-CM | POA: Diagnosis not present

## 2019-10-26 DIAGNOSIS — I10 Essential (primary) hypertension: Secondary | ICD-10-CM

## 2019-10-26 DIAGNOSIS — E669 Obesity, unspecified: Secondary | ICD-10-CM

## 2019-10-26 DIAGNOSIS — Z122 Encounter for screening for malignant neoplasm of respiratory organs: Secondary | ICD-10-CM

## 2019-10-26 DIAGNOSIS — Z87891 Personal history of nicotine dependence: Secondary | ICD-10-CM

## 2019-10-26 DIAGNOSIS — Z6830 Body mass index (BMI) 30.0-30.9, adult: Secondary | ICD-10-CM

## 2019-10-26 MED ORDER — VITAMIN D (ERGOCALCIFEROL) 1.25 MG (50000 UNIT) PO CAPS: 50000.0000 [IU] | ORAL_CAPSULE | ORAL | 0 refills | Status: DC

## 2019-10-26 NOTE — Progress Notes (Signed)
TeleHealth Visit:  Due to the COVID-19 pandemic, this visit was completed with telemedicine (audio/video) technology to reduce patient and provider exposure as well as to preserve personal protective equipment.   Tracy Brown has verbally consented to this TeleHealth visit. The patient is located at home, the provider is located at the [facility] office. The participants in this visit include the listed provider and patient. The visit was conducted today via FaceTime.  Chief Complaint: OBESITY Tracy Brown is here to discuss her progress with her obesity treatment plan along with follow-up of her obesity related diagnoses. Tracy Brown is on the Category 1 Plan and states she is following her eating plan approximately 80% of the time. Tracy Brown states she is walking 60 minutes 4 times per week.  Today's visit was #: 15 Starting weight: 168 lbs Starting date: 12/30/2018  Interim History: Tracy Brown states that her weight remains the same. She is struggling with the plan as not structured.  Subjective:   Essential hypertension. Blood pressure has been well controlled. Tracy Brown is on no medications.  BP Readings from Last 3 Encounters:  06/02/19 98/66  04/02/19 120/62  01/13/19 118/82   Lab Results  Component Value Date   CREATININE 0.77 04/02/2019   CREATININE 0.77 10/12/2018   CREATININE 0.75 02/06/2018   Vitamin D deficiency. No nausea, vomiting, or muscle weakness. Last Vitamin D 35.59 on 10/12/2018.  Assessment/Plan:   Essential hypertension. Tracy Brown is working on healthy weight loss and exercise to improve blood pressure control. We will watch for signs of hypotension as she continues her lifestyle modifications.  Vitamin D deficiency. Low Vitamin D level contributes to fatigue and are associated with obesity, breast, and colon cancer. She was given a prescription for Vitamin D, Ergocalciferol, (DRISDOL) 1.25 MG (50000 UNIT) CAPS capsule every week #4 with 0 refills and will  follow-up for routine testing of Vitamin D, at least 2-3 times per year to avoid over-replacement.   Class 1 obesity with serious comorbidity and body mass index (BMI) of 30.0 to 30.9 in adult, unspecified obesity type - Starting BMI greater then 30.  Tracy Brown is currently in the action stage of change. As such, her goal is to continue with weight loss efforts. She has agreed to the Category 1 Plan.   She will work on meal planning and increasing her water intake.  She was given "Eating Out Sheet."  Exercise goals: Tracy Brown will continue walking 60 minutes 4 days a week.  Behavioral modification strategies: increasing lean protein intake, decreasing simple carbohydrates, increasing vegetables, increasing water intake, decreasing eating out, no skipping meals, meal planning and cooking strategies, keeping healthy foods in the home, better snacking choices, emotional eating strategies and planning for success.  Tracy Brown has agreed to follow-up with our clinic in 4-5 weeks. She was informed of the importance of frequent follow-up visits to maximize her success with intensive lifestyle modifications for her multiple health conditions.  Objective:   VITALS: Per patient if applicable, see vitals. GENERAL: Alert and in no acute distress. CARDIOPULMONARY: No increased WOB. Speaking in clear sentences.  PSYCH: Pleasant and cooperative. Speech normal rate and rhythm. Affect is appropriate. Insight and judgement are appropriate. Attention is focused, linear, and appropriate.  NEURO: Oriented as arrived to appointment on time with no prompting.   Lab Results  Component Value Date   CREATININE 0.77 04/02/2019   BUN 17 04/02/2019   NA 139 04/02/2019   K 4.2 04/02/2019   CL 105 04/02/2019   CO2  27 04/02/2019   Lab Results  Component Value Date   ALT 38 (H) 04/02/2019   AST 24 04/02/2019   ALKPHOS 64 04/02/2019   BILITOT 0.3 04/02/2019   Lab Results  Component Value Date   HGBA1C 5.4 12/30/2018    Lab Results  Component Value Date   INSULIN 16.7 12/30/2018   Lab Results  Component Value Date   TSH 0.37 04/02/2019   Lab Results  Component Value Date   CHOL 193 04/02/2019   HDL 52.00 04/02/2019   LDLCALC 125 (H) 04/02/2019   TRIG 81.0 04/02/2019   CHOLHDL 4 04/02/2019   Lab Results  Component Value Date   WBC 6.1 04/02/2019   HGB 13.2 04/02/2019   HCT 38.7 04/02/2019   MCV 84.3 04/02/2019   PLT 209.0 04/02/2019   No results found for: IRON, TIBC, FERRITIN  Attestation Statements:   Reviewed by clinician on day of visit: allergies, medications, problem list, medical history, surgical history, family history, social history, and previous encounter notes.  Migdalia Dk, am acting as Location manager for [employer], DO   I have reviewed the above documentation for accuracy and completeness, and I agree with the above. Jearld Lesch, DO

## 2019-10-28 ENCOUNTER — Other Ambulatory Visit: Payer: Self-pay | Admitting: *Deleted

## 2019-10-28 DIAGNOSIS — Z87891 Personal history of nicotine dependence: Secondary | ICD-10-CM

## 2019-10-28 NOTE — Progress Notes (Signed)
Please call patient and let them  know their  low dose Ct was read as a Lung RADS 2: nodules that are benign in appearance and behavior with a very low likelihood of becoming a clinically active cancer due to size or lack of growth. Recommendation per radiology is for a repeat LDCT in 12 months. .Please let them  know we will order and schedule their  annual screening scan for 10/2020. Please let them  know there was notation of CAD on their  scan.  Please remind the patient  that this is a non-gated exam therefore degree or severity of disease  cannot be determined. Please have them  follow up with their PCP regarding potential risk factor modification, dietary therapy or pharmacologic therapy if clinically indicated. Pt.  is not  currently on statin therapy. Please place order for annual  screening scan for  10/2020 and fax results to PCP. Thanks so much.  Langley Gauss, make sure she follows up with PCP about the notation of CAD and the aortic calcification on the aortic valve. Thanks so much

## 2019-11-05 ENCOUNTER — Encounter: Payer: Self-pay | Admitting: Family Medicine

## 2019-11-05 DIAGNOSIS — I515 Myocardial degeneration: Secondary | ICD-10-CM

## 2019-11-05 NOTE — Telephone Encounter (Signed)
I looked a the report and I did a referral for her to see Cardiology to evaluate this further

## 2019-11-10 ENCOUNTER — Other Ambulatory Visit (INDEPENDENT_AMBULATORY_CARE_PROVIDER_SITE_OTHER): Payer: Self-pay | Admitting: Bariatrics

## 2019-11-18 ENCOUNTER — Other Ambulatory Visit (INDEPENDENT_AMBULATORY_CARE_PROVIDER_SITE_OTHER): Payer: Self-pay | Admitting: Bariatrics

## 2019-11-18 DIAGNOSIS — E8881 Metabolic syndrome: Secondary | ICD-10-CM

## 2019-11-18 MED ORDER — METFORMIN HCL 500 MG PO TABS: 500.0000 mg | ORAL_TABLET | Freq: Every day | ORAL | 0 refills | Status: DC

## 2019-12-02 ENCOUNTER — Ambulatory Visit (INDEPENDENT_AMBULATORY_CARE_PROVIDER_SITE_OTHER): Payer: 59 | Admitting: Family Medicine

## 2019-12-02 ENCOUNTER — Ambulatory Visit (INDEPENDENT_AMBULATORY_CARE_PROVIDER_SITE_OTHER): Payer: 59 | Admitting: Bariatrics

## 2019-12-02 ENCOUNTER — Encounter (INDEPENDENT_AMBULATORY_CARE_PROVIDER_SITE_OTHER): Payer: Self-pay | Admitting: Family Medicine

## 2019-12-02 ENCOUNTER — Other Ambulatory Visit: Payer: Self-pay

## 2019-12-02 VITALS — BP 99/65 | HR 73 | Temp 97.5°F | Ht 61.0 in | Wt 133.0 lb

## 2019-12-02 DIAGNOSIS — E8881 Metabolic syndrome: Secondary | ICD-10-CM | POA: Diagnosis not present

## 2019-12-02 DIAGNOSIS — Z6830 Body mass index (BMI) 30.0-30.9, adult: Secondary | ICD-10-CM

## 2019-12-02 DIAGNOSIS — E7849 Other hyperlipidemia: Secondary | ICD-10-CM

## 2019-12-02 DIAGNOSIS — Z9189 Other specified personal risk factors, not elsewhere classified: Secondary | ICD-10-CM | POA: Diagnosis not present

## 2019-12-02 DIAGNOSIS — E038 Other specified hypothyroidism: Secondary | ICD-10-CM | POA: Diagnosis not present

## 2019-12-02 DIAGNOSIS — E559 Vitamin D deficiency, unspecified: Secondary | ICD-10-CM

## 2019-12-02 DIAGNOSIS — E669 Obesity, unspecified: Secondary | ICD-10-CM

## 2019-12-02 MED ORDER — METFORMIN HCL 500 MG PO TABS: 500.0000 mg | ORAL_TABLET | Freq: Every day | ORAL | 0 refills | Status: DC

## 2019-12-06 NOTE — Progress Notes (Signed)
Chief Complaint:   OBESITY Tracy Brown is here to discuss her progress with her obesity treatment plan along with follow-up of her obesity related diagnoses. Tracy Brown is on the Category 1 Plan and states she is following her eating plan approximately 80% of the time. Tracy Brown states she is exercising for 0 minutes 0 times per week.  Today's visit was #: 53 Starting weight: 168 lbs Starting date: 12/30/2018 Today's weight: 133 lbs Today's date: 12/02/2019 Total lbs lost to date: 35 lbs Total lbs lost since last in-office visit: 16 lbs  Interim History: Tracy Brown has not been doing much.  She feels she hasn't been as on track the past month as he was previously.  She feels she may be snacking more.  Occasional hunger when she goes off track.  Subjective:   1. Vitamin D deficiency Tracy Brown's Vitamin D level was 35.59 on 10/12/2018. She is currently taking prescription vitamin D 50,000 IU each week. She denies nausea, vomiting or muscle weakness.  She endorses fatigue.  2. Insulin resistance Tracy Brown has a diagnosis of insulin resistance based on her elevated fasting insulin level >5. She continues to work on diet and exercise to decrease her risk of diabetes.  Taking metformin with no GI side effects.  Lab Results  Component Value Date   INSULIN 16.7 12/30/2018   Lab Results  Component Value Date   HGBA1C 5.4 12/30/2018   3. Other hyperlipidemia Tracy Brown has hyperlipidemia and has been trying to improve her cholesterol levels with intensive lifestyle modification including a low saturated fat diet, exercise and weight loss. She denies any chest pain, claudication or myalgias.  She is not on a statin.  Lab Results  Component Value Date   ALT 38 (H) 04/02/2019   AST 24 04/02/2019   ALKPHOS 64 04/02/2019   BILITOT 0.3 04/02/2019   Lab Results  Component Value Date   CHOL 193 04/02/2019   HDL 52.00 04/02/2019   LDLCALC 125 (H) 04/02/2019   TRIG 81.0 04/02/2019   CHOLHDL 4 04/02/2019   4. Other  specified hypothyroidism Tracy Brown is taking levothyroxine 100 mcg daily.  Lab Results  Component Value Date   TSH 0.37 04/02/2019   5. At risk for osteoporosis Tracy Brown is at higher risk of osteopenia and osteoporosis due to Vitamin D deficiency.   Assessment/Plan:   1. Vitamin D deficiency Low Vitamin D level contributes to fatigue and are associated with obesity, breast, and colon cancer. She agrees to continue to take prescription Vitamin D @50 ,000 IU every week and will follow-up for routine testing of Vitamin D, at least 2-3 times per year to avoid over-replacement. - VITAMIN D 25 Hydroxy (Vit-D Deficiency, Fractures)  2. Insulin resistance Tracy Brown will continue to work on weight loss, exercise, and decreasing simple carbohydrates to help decrease the risk of diabetes. Tracy Brown agreed to follow-up with [name] as directed to closely monitor her progress. - metFORMIN (GLUCOPHAGE) 500 MG tablet; Take 1 tablet (500 mg total) by mouth daily with breakfast.  Dispense: 15 tablet; Refill: 0 - Comprehensive metabolic panel - Hemoglobin A1c - Insulin, random  3. Other hyperlipidemia Cardiovascular risk and specific lipid/LDL goals reviewed.  We discussed several lifestyle modifications today and Tracy Brown will continue to work on diet, exercise and weight loss efforts. Orders and follow up as documented in patient record.   Counseling Intensive lifestyle modifications are the first line treatment for this issue. . Dietary changes: Increase soluble fiber. Decrease simple carbohydrates. . Exercise changes: Moderate to vigorous-intensity aerobic  activity 150 minutes per week if tolerated. . Lipid-lowering medications: see documented in medical record. - Lipid Panel With LDL/HDL Ratio  4. Other specified hypothyroidism Patient with long-standing hypothyroidism, on levothyroxine therapy. She appears euthyroid. Orders and follow up as documented in patient record.  Counseling . Good thyroid control is  important for overall health. Tracy Brown thyroid levels are dangerous and will not improve weight loss results. . The correct way to take levothyroxine is fasting, with water, separated by at least 30 minutes from breakfast, and separated by more than 4 hours from calcium, iron, multivitamins, acid reflux medications (PPIs).  - Comprehensive metabolic panel - TSH  5. At risk for osteoporosis Tracy Brown was given approximately 15 minutes of osteoporosis prevention counseling today. Tracy Brown is at risk for osteopenia and osteoporosis due to her Vitamin D deficiency. She was encouraged to take her Vitamin D and follow her higher calcium diet and increase strengthening exercise to help strengthen her bones and decrease her risk of osteopenia and osteoporosis.  Repetitive spaced learning was employed today to elicit superior memory formation and behavioral change.  6. Class 1 obesity with serious comorbidity and body mass index (BMI) of 30.0 to 30.9 in adult, unspecified obesity type Tracy Brown is currently in the action stage of change. As such, her goal is to continue with weight loss efforts. She has agreed to the Category 1 Plan.   Exercise goals: Starting to walk again.  Behavioral modification strategies: increasing lean protein intake, increasing vegetables, meal planning and cooking strategies and planning for success.  Tracy Brown has agreed to follow-up with our clinic in 3 weeks. She was informed of the importance of frequent follow-up visits to maximize her success with intensive lifestyle modifications for her multiple health conditions.   Tracy Brown was informed we would discuss her lab results at her next visit unless there is a critical issue that needs to be addressed sooner. Tracy Brown agreed to keep her next visit at the agreed upon time to discuss these results.  Objective:   Blood pressure 99/65, pulse 73, temperature (!) 97.5 F (36.4 C), temperature source Oral, height 5\' 1"  (1.549 m), weight 133  lb (60.3 kg), SpO2 99 %. Body mass index is 25.13 kg/m.  General: Cooperative, alert, well developed, in no acute distress. HEENT: Conjunctivae and lids unremarkable. Cardiovascular: Regular rhythm.  Lungs: Normal work of breathing. Neurologic: No focal deficits.   Lab Results  Component Value Date   CREATININE 0.77 04/02/2019   BUN 17 04/02/2019   NA 139 04/02/2019   K 4.2 04/02/2019   CL 105 04/02/2019   CO2 27 04/02/2019   Lab Results  Component Value Date   ALT 38 (H) 04/02/2019   AST 24 04/02/2019   ALKPHOS 64 04/02/2019   BILITOT 0.3 04/02/2019   Lab Results  Component Value Date   HGBA1C 5.4 12/30/2018   Lab Results  Component Value Date   INSULIN 16.7 12/30/2018   Lab Results  Component Value Date   TSH 0.37 04/02/2019   Lab Results  Component Value Date   CHOL 193 04/02/2019   HDL 52.00 04/02/2019   LDLCALC 125 (H) 04/02/2019   TRIG 81.0 04/02/2019   CHOLHDL 4 04/02/2019   Lab Results  Component Value Date   WBC 6.1 04/02/2019   HGB 13.2 04/02/2019   HCT 38.7 04/02/2019   MCV 84.3 04/02/2019   PLT 209.0 04/02/2019   Attestation Statements:   Reviewed by clinician on day of visit: allergies, medications, problem list, medical history,  surgical history, family history, social history, and previous encounter notes.  I, Water quality scientist, CMA, am acting as transcriptionist for Coralie Common, MD. I have reviewed the above documentation for accuracy and completeness, and I agree with the above. - Ilene Qua, MD

## 2019-12-08 ENCOUNTER — Ambulatory Visit (INDEPENDENT_AMBULATORY_CARE_PROVIDER_SITE_OTHER): Payer: 59 | Admitting: Cardiovascular Disease

## 2019-12-08 ENCOUNTER — Encounter: Payer: Self-pay | Admitting: Cardiovascular Disease

## 2019-12-08 ENCOUNTER — Other Ambulatory Visit: Payer: Self-pay

## 2019-12-08 DIAGNOSIS — I2510 Atherosclerotic heart disease of native coronary artery without angina pectoris: Secondary | ICD-10-CM | POA: Insufficient documentation

## 2019-12-08 LAB — COMPREHENSIVE METABOLIC PANEL
ALT: 18 IU/L (ref 0–32)
AST: 15 IU/L (ref 0–40)
Albumin/Globulin Ratio: 2.2 (ref 1.2–2.2)
Albumin: 4.6 g/dL (ref 3.8–4.9)
Alkaline Phosphatase: 61 IU/L (ref 39–117)
BUN/Creatinine Ratio: 29 — ABNORMAL HIGH (ref 9–23)
BUN: 22 mg/dL (ref 6–24)
Bilirubin Total: 0.2 mg/dL (ref 0.0–1.2)
CO2: 24 mmol/L (ref 20–29)
Calcium: 9.4 mg/dL (ref 8.7–10.2)
Chloride: 106 mmol/L (ref 96–106)
Creatinine, Ser: 0.77 mg/dL (ref 0.57–1.00)
GFR calc Af Amer: 98 mL/min/{1.73_m2} (ref >59)
GFR calc non Af Amer: 85 mL/min/{1.73_m2} (ref >59)
Globulin, Total: 2.1 g/dL (ref 1.5–4.5)
Glucose: 86 mg/dL (ref 65–99)
Potassium: 4.4 mmol/L (ref 3.5–5.2)
Sodium: 142 mmol/L (ref 134–144)
Total Protein: 6.7 g/dL (ref 6.0–8.5)

## 2019-12-08 LAB — LIPID PANEL WITH LDL/HDL RATIO
Cholesterol, Total: 193 mg/dL (ref 100–199)
HDL: 60 mg/dL (ref >39)
LDL Chol Calc (NIH): 119 mg/dL — ABNORMAL HIGH (ref 0–99)
LDL/HDL Ratio: 2 ratio (ref 0.0–3.2)
Triglycerides: 76 mg/dL (ref 0–149)
VLDL Cholesterol Cal: 14 mg/dL (ref 5–40)

## 2019-12-08 LAB — HEMOGLOBIN A1C
Est. average glucose Bld gHb Est-mCnc: 108 mg/dL
Hgb A1c MFr Bld: 5.4 % (ref 4.8–5.6)

## 2019-12-08 LAB — INSULIN, RANDOM: INSULIN: 5.4 u[IU]/mL (ref 2.6–24.9)

## 2019-12-08 LAB — VITAMIN D 25 HYDROXY (VIT D DEFICIENCY, FRACTURES): Vit D, 25-Hydroxy: 57.6 ng/mL (ref 30.0–100.0)

## 2019-12-08 LAB — TSH: TSH: 0.404 u[IU]/mL — ABNORMAL LOW (ref 0.450–4.500)

## 2019-12-08 NOTE — Assessment & Plan Note (Signed)
History of hyperlipidemia not on statin therapy with lipid profile performed 04/02/2019 revealing total cholesterol 193, LDL 125 and HDL 52.

## 2019-12-08 NOTE — Progress Notes (Signed)
12/08/2019 Tracy Brown   1960/12/08  [id number]  Primary Physician Laurey Morale, MD Primary Cardiologist: Lorretta Harp MD Garret Reddish, [place], [place]  HPI:  Tracy Brown is a 59 y.o. thin appearing divorced Caucasian female mother of 31, grandmother 4 grandchildren referred by Dr. Sharlene Motts for cardiovascular valuation because of calcification seen on chest CT performed 10/26/2019.  She currently works at home for [employer].  She stopped smoking 3 years ago and smoked 40 pack years.  She has mild untreated hyperlipidemia but no other cardiac risk factors.  She is never had a heart attack or stroke.  She denies chest pain or shortness of breath.   Current Meds  Medication Sig  . Ascorbic Acid (VITAMIN C) 1000 MG tablet Take 1,000 mg by mouth daily.  Marland Kitchen aspirin 81 MG chewable tablet Chew 81 mg by mouth daily.  . cetirizine (ZYRTEC) 10 MG tablet Take 10 mg by mouth daily.  Marland Kitchen ibuprofen (ADVIL,MOTRIN) 200 MG tablet Take 200 mg by mouth every 6 (six) hours as needed.  Marland Kitchen levothyroxine (SYNTHROID) 100 MCG tablet Take 1 tablet (100 mcg total) by mouth daily.  . metFORMIN (GLUCOPHAGE) 500 MG tablet Take 1 tablet (500 mg total) by mouth daily with breakfast.  . MULTIPLE VITAMINS PO Take by mouth.  . Vitamin D, Ergocalciferol, (DRISDOL) 1.25 MG (50000 UNIT) CAPS capsule Take 1 capsule (50,000 Units total) by mouth every 7 (seven) days.  Marland Kitchen zinc gluconate 50 MG tablet Take 50 mg by mouth daily.     Allergies  Allergen Reactions  . Hydrocodone   . Phentermine Itching  . Zithromax [Azithromycin Dihydrate]     Social History   Socioeconomic History  . Marital status: Divorced    Spouse name: Not on file  . Number of children: 1  . Years of education: Not on file  . Highest education level: Not on file  Occupational History  . Not on file  Tobacco Use  . Smoking status: Former Smoker    Packs/day: 1.00    Years: 37.00    Pack years: 37.00    Quit date: 10/10/2016   Years since quitting: 3.1  . Smokeless tobacco: Never Used  Substance and Sexual Activity  . Alcohol use: Yes    Alcohol/week: 0.0 standard drinks    Comment: once a month  . Drug use: No  . Sexual activity: Yes  Other Topics Concern  . Not on file  Social History Narrative  . Not on file   Social Determinants of Health   Financial Resource Strain:   . Difficulty of Paying Living Expenses:   Food Insecurity:   . Worried About Charity fundraiser in the Last Year:   . Arboriculturist in the Last Year:   Transportation Needs:   . Film/video editor (Medical):   Marland Kitchen Lack of Transportation (Non-Medical):   Physical Activity:   . Days of Exercise per Week:   . Minutes of Exercise per Session:   Stress:   . Feeling of Stress :   Social Connections:   . Frequency of Communication with Friends and Family:   . Frequency of Social Gatherings with Friends and Family:   . Attends Religious Services:   . Active Member of Clubs or Organizations:   . Attends Archivist Meetings:   Marland Kitchen Marital Status:   Intimate Partner Violence:   . Fear of Current or Ex-Partner:   . Emotionally Abused:   .  Physically Abused:   . Sexually Abused:      Review of Systems: General: negative for chills, fever, night sweats or weight changes.  Cardiovascular: negative for chest pain, dyspnea on exertion, edema, orthopnea, palpitations, paroxysmal nocturnal dyspnea or shortness of breath Dermatological: negative for rash Respiratory: negative for cough or wheezing Urologic: negative for hematuria Abdominal: negative for nausea, vomiting, diarrhea, bright red blood per rectum, melena, or hematemesis Neurologic: negative for visual changes, syncope, or dizziness All other systems reviewed and are otherwise negative except as noted above.    Blood pressure 100/68, pulse 73, height 5\' 1"  (1.549 m), weight 137 lb 3.2 oz (62.2 kg).  General appearance: alert and no distress Neck: no adenopathy,  no carotid bruit, no JVD, supple, symmetrical, trachea midline and thyroid not enlarged, symmetric, no tenderness/mass/nodules Lungs: clear to auscultation bilaterally Heart: regular rate and rhythm, S1, S2 normal, no murmur, click, rub or gallop Extremities: extremities normal, atraumatic, no cyanosis or edema Pulses: 2+ and symmetric Skin: Skin color, texture, turgor normal. No rashes or lesions Neurologic: Alert and oriented X 3, normal strength and tone. Normal symmetric reflexes. Normal coordination and gait  EKG sinus rhythm at 73 without ST or T wave changes.  I personally reviewed this EKG.  ASSESSMENT AND PLAN:   Other and unspecified hyperlipidemia History of hyperlipidemia not on statin therapy with lipid profile performed 04/02/2019 revealing total cholesterol 193, LDL 125 and HDL 52.  Coronary artery calcification seen on CT scan Recent chest CT that showed coronary calcification 10/26/2019.  Going to get a formal coronary calcium score.  If this is elevated I will address her mild hyperlipidemia.  She currently denies chest pain or shortness of breath.      Lorretta Harp MD FACP,FACC,FAHA, [facility] 12/08/2019 4:35 PM

## 2019-12-08 NOTE — Assessment & Plan Note (Signed)
Recent chest CT that showed coronary calcification 10/26/2019.  Going to get a formal coronary calcium score.  If this is elevated I will address her mild hyperlipidemia.  She currently denies chest pain or shortness of breath.

## 2019-12-08 NOTE — Patient Instructions (Signed)
Medication Instructions:  The current medical regimen is effective;  continue present plan and medications.  *If you need a refill on your cardiac medications before your next appointment, please call your pharmacy*    Testing/Procedures: CARDIAC SCORING    Follow-Up: At [facility], you and your health needs are our priority.  As part of our continuing mission to provide you with exceptional heart care, we have created designated Provider Care Teams.  These Care Teams include your primary Cardiologist (physician) and Advanced Practice Providers (APPs -  Physician Assistants and Nurse Practitioners) who all work together to provide you with the care you need, when you need it.  We recommend signing up for the patient portal called "MyChart".  Sign up information is provided on this After Visit Summary.  MyChart is used to connect with patients for Virtual Visits (Telemedicine).  Patients are able to view lab/test results, encounter notes, upcoming appointments, etc.  Non-urgent messages can be sent to your provider as well.   To learn more about what you can do with MyChart, go to [email or web address].    Your next appointment:   6 month(s)  The format for your next appointment:   In Person  Provider:   Quay Burow, MD

## 2019-12-10 ENCOUNTER — Encounter (INDEPENDENT_AMBULATORY_CARE_PROVIDER_SITE_OTHER): Payer: Self-pay | Admitting: Bariatrics

## 2019-12-10 DIAGNOSIS — E559 Vitamin D deficiency, unspecified: Secondary | ICD-10-CM

## 2019-12-10 DIAGNOSIS — E038 Other specified hypothyroidism: Secondary | ICD-10-CM

## 2019-12-13 NOTE — Telephone Encounter (Signed)
Please advise 

## 2019-12-16 ENCOUNTER — Other Ambulatory Visit: Payer: Self-pay

## 2019-12-16 ENCOUNTER — Ambulatory Visit (INDEPENDENT_AMBULATORY_CARE_PROVIDER_SITE_OTHER)
Admission: RE | Admit: 2019-12-16 | Discharge: 2019-12-16 | Disposition: A | Discharge status: Home / Self Care | Payer: Self-pay | Source: Ambulatory Visit | Attending: Cardiovascular Disease | Admitting: Cardiovascular Disease

## 2019-12-16 ENCOUNTER — Encounter (INDEPENDENT_AMBULATORY_CARE_PROVIDER_SITE_OTHER): Payer: Self-pay | Admitting: Bariatrics

## 2019-12-16 DIAGNOSIS — I2510 Atherosclerotic heart disease of native coronary artery without angina pectoris: Secondary | ICD-10-CM

## 2019-12-17 ENCOUNTER — Other Ambulatory Visit: Payer: Self-pay

## 2019-12-17 DIAGNOSIS — I2510 Atherosclerotic heart disease of native coronary artery without angina pectoris: Secondary | ICD-10-CM

## 2019-12-17 MED ORDER — ATORVASTATIN CALCIUM 40 MG PO TABS: ORAL_TABLET | ORAL | 3 refills | Status: DC

## 2019-12-20 MED ORDER — LEVOTHYROXINE SODIUM 88 MCG PO TABS: 88.0000 ug | ORAL_TABLET | Freq: Every day | ORAL | 0 refills | Status: DC

## 2019-12-20 NOTE — Telephone Encounter (Signed)
Please advise 

## 2019-12-20 NOTE — Telephone Encounter (Signed)
Just fyi.

## 2019-12-21 ENCOUNTER — Encounter: Payer: Self-pay | Admitting: Family Medicine

## 2019-12-21 NOTE — Telephone Encounter (Signed)
As for the thyroid medication, I would NOT change your dose. It is perfect as it is. For the Atorvastatin, I think you should start it. These are safe medications and we providers do not know where all these rumors get started

## 2019-12-21 NOTE — Telephone Encounter (Signed)
Please advise 

## 2019-12-22 MED ORDER — VITAMIN D (ERGOCALCIFEROL) 1.25 MG (50000 UNIT) PO CAPS: 50000.0000 [IU] | ORAL_CAPSULE | ORAL | 0 refills | Status: DC

## 2019-12-27 ENCOUNTER — Other Ambulatory Visit (INDEPENDENT_AMBULATORY_CARE_PROVIDER_SITE_OTHER): Payer: Self-pay | Admitting: Bariatrics

## 2019-12-27 DIAGNOSIS — E8881 Metabolic syndrome: Secondary | ICD-10-CM

## 2020-01-11 ENCOUNTER — Ambulatory Visit (INDEPENDENT_AMBULATORY_CARE_PROVIDER_SITE_OTHER): Payer: 59 | Admitting: Bariatrics

## 2020-01-11 ENCOUNTER — Other Ambulatory Visit: Payer: Self-pay

## 2020-01-11 ENCOUNTER — Encounter (INDEPENDENT_AMBULATORY_CARE_PROVIDER_SITE_OTHER): Payer: Self-pay | Admitting: Bariatrics

## 2020-01-11 VITALS — BP 111/70 | HR 64 | Temp 98.4°F | Ht 61.0 in | Wt 132.0 lb

## 2020-01-11 DIAGNOSIS — Z9189 Other specified personal risk factors, not elsewhere classified: Secondary | ICD-10-CM | POA: Diagnosis not present

## 2020-01-11 DIAGNOSIS — E038 Other specified hypothyroidism: Secondary | ICD-10-CM

## 2020-01-11 DIAGNOSIS — E8881 Metabolic syndrome: Secondary | ICD-10-CM | POA: Diagnosis not present

## 2020-01-11 DIAGNOSIS — E559 Vitamin D deficiency, unspecified: Secondary | ICD-10-CM

## 2020-01-11 DIAGNOSIS — E669 Obesity, unspecified: Secondary | ICD-10-CM

## 2020-01-11 DIAGNOSIS — Z6830 Body mass index (BMI) 30.0-30.9, adult: Secondary | ICD-10-CM

## 2020-01-11 MED ORDER — METFORMIN HCL 500 MG PO TABS: ORAL_TABLET | ORAL | 0 refills | Status: DC

## 2020-01-11 MED ORDER — VITAMIN D (ERGOCALCIFEROL) 1.25 MG (50000 UNIT) PO CAPS: 50000.0000 [IU] | ORAL_CAPSULE | ORAL | 0 refills | Status: DC

## 2020-01-12 ENCOUNTER — Encounter (INDEPENDENT_AMBULATORY_CARE_PROVIDER_SITE_OTHER): Payer: Self-pay | Admitting: Bariatrics

## 2020-01-12 NOTE — Progress Notes (Signed)
Chief Complaint:   OBESITY Tracy Brown is here to discuss her progress with her obesity treatment plan along with follow-up of her obesity related diagnoses. Tracy Brown is on the Category 1 Plan and states she is following her eating plan approximately 50% of the time. Tracy Brown states she is walking 45 minutes 3-4 times per week.  Today's visit was #: 63 Starting weight: 168 lbs Starting date: 12/30/2018 Today's weight: 132 lbs Today's date: 01/11/2020 Total lbs lost to date: 36 Total lbs lost since last in-office visit: 1  Interim History: Tracy Brown is down 1 lb. She was on vacation last week.  Subjective:   Vitamin D deficiency. No nausea, vomiting, or muscle weakness. Last Vitamin D 57.6 on 12/07/2019.  Insulin resistance. Tracy Brown has a diagnosis of insulin resistance based on her elevated fasting insulin level >5. She continues to work on diet and exercise to decrease her risk of diabetes. She is taking metformin.  Lab Results  Component Value Date   INSULIN 5.4 12/07/2019   INSULIN 16.7 12/30/2018   Lab Results  Component Value Date   HGBA1C 5.4 12/07/2019   Other specified hypothyroidism. Tracy Brown is taking Synthroid.   Lab Results  Component Value Date   TSH 0.404 (L) 12/07/2019   At risk for osteoporosis. Tracy Brown is at higher risk of osteopenia and osteoporosis due to Vitamin D deficiency.   Assessment/Plan:   Vitamin D deficiency. Low Vitamin D level contributes to fatigue and are associated with obesity, breast, and colon cancer. She was given a prescription for Vitamin D, Ergocalciferol, (DRISDOL) 1.25 MG (50000 UNIT) CAPS capsule every other week #2 with 0 refills and will follow-up for routine testing of Vitamin D, at least 2-3 times per year to avoid over-replacement.   Insulin resistance. Tracy Brown will continue to work on weight loss, exercise, and decreasing simple carbohydrates to help decrease the risk of diabetes. Tracy Brown agreed to follow-up with [name] as directed to  closely monitor her progress. Prescription was given for metFORMIN (GLUCOPHAGE) 500 MG tablet 1 daily with breakfast #30 with 0 refills.  Other specified hypothyroidism. Patient with long-standing hypothyroidism, on levothyroxine therapy. She appears euthyroid. Orders and follow up as documented in patient record. Tracy Brown will follow-up with her PCP regarding thyroid medication and will keep dose at 100 mcg.  Counseling . Good thyroid control is important for overall health. Supratherapeutic thyroid levels are dangerous and will not improve weight loss results. . The correct way to take levothyroxine is fasting, with water, separated by at least 30 minutes from breakfast, and separated by more than 4 hours from calcium, iron, multivitamins, acid reflux medications (PPIs).   At risk for osteoporosis. Tracy Brown was given approximately 15 minutes of osteoporosis prevention counseling today. [name] is at risk for osteopenia and osteoporosis due to her Vitamin D deficiency. She was encouraged to take her Vitamin D and follow her higher calcium diet and increase strengthening exercise to help strengthen her bones and decrease her risk of osteopenia and osteoporosis.  Repetitive spaced learning was employed today to elicit superior memory formation and behavioral change.  Class 1 obesity with serious comorbidity and body mass index (BMI) of 30.0 to 30.9 in adult, unspecified obesity type - BMI greater than 30 at start.  Tracy Brown is currently in the action stage of change. As such, her goal is to continue with weight loss efforts. She has agreed to the Category 1 Plan.   She will work on meal planning, intentional eating, and increasing her  protein intake.  Exercise goals: Tracy Brown will add resistance to her current exercise regimen.  Behavioral modification strategies: increasing lean protein intake, decreasing simple carbohydrates, increasing vegetables, increasing water intake, decreasing eating out, no skipping  meals, meal planning and cooking strategies, keeping healthy foods in the home and planning for success.  Tracy Brown has agreed to follow-up with our clinic in 4 weeks. She was informed of the importance of frequent follow-up visits to maximize her success with intensive lifestyle modifications for her multiple health conditions.   Objective:   Blood pressure 111/70, pulse 64, temperature 98.4 F (36.9 C), height 5\' 1"  (1.549 m), weight 132 lb (59.9 kg), SpO2 97 %. Body mass index is 24.94 kg/m.  General: Cooperative, alert, well developed, in no acute distress. HEENT: Conjunctivae and lids unremarkable. Cardiovascular: Regular rhythm.  Lungs: Normal work of breathing. Neurologic: No focal deficits.   Lab Results  Component Value Date   CREATININE 0.77 12/07/2019   BUN 22 12/07/2019   NA 142 12/07/2019   K 4.4 12/07/2019   CL 106 12/07/2019   CO2 24 12/07/2019   Lab Results  Component Value Date   ALT 18 12/07/2019   AST 15 12/07/2019   ALKPHOS 61 12/07/2019   BILITOT 0.2 12/07/2019   Lab Results  Component Value Date   HGBA1C 5.4 12/07/2019   HGBA1C 5.4 12/30/2018   Lab Results  Component Value Date   INSULIN 5.4 12/07/2019   INSULIN 16.7 12/30/2018   Lab Results  Component Value Date   TSH 0.404 (L) 12/07/2019   Lab Results  Component Value Date   CHOL 193 12/07/2019   HDL 60 12/07/2019   LDLCALC 119 (H) 12/07/2019   TRIG 76 12/07/2019   CHOLHDL 4 04/02/2019   Lab Results  Component Value Date   WBC 6.1 04/02/2019   HGB 13.2 04/02/2019   HCT 38.7 04/02/2019   MCV 84.3 04/02/2019   PLT 209.0 04/02/2019   No results found for: IRON, TIBC, FERRITIN  Attestation Statements:   Reviewed by clinician on day of visit: allergies, medications, problem list, medical history, surgical history, family history, social history, and previous encounter notes.  Migdalia Dk, am acting as Location manager for [employer], DO   I have reviewed the above documentation  for accuracy and completeness, and I agree with the above. Jearld Lesch, DO

## 2020-02-08 ENCOUNTER — Other Ambulatory Visit: Payer: Self-pay

## 2020-02-08 ENCOUNTER — Ambulatory Visit (INDEPENDENT_AMBULATORY_CARE_PROVIDER_SITE_OTHER): Payer: 59 | Admitting: Bariatrics

## 2020-02-08 ENCOUNTER — Encounter (INDEPENDENT_AMBULATORY_CARE_PROVIDER_SITE_OTHER): Payer: Self-pay | Admitting: Bariatrics

## 2020-02-08 VITALS — BP 120/84 | HR 74 | Temp 98.1°F | Ht 61.0 in | Wt 132.0 lb

## 2020-02-08 DIAGNOSIS — E8881 Metabolic syndrome: Secondary | ICD-10-CM | POA: Diagnosis not present

## 2020-02-08 DIAGNOSIS — E038 Other specified hypothyroidism: Secondary | ICD-10-CM

## 2020-02-08 DIAGNOSIS — Z6830 Body mass index (BMI) 30.0-30.9, adult: Secondary | ICD-10-CM

## 2020-02-08 DIAGNOSIS — E669 Obesity, unspecified: Secondary | ICD-10-CM | POA: Diagnosis not present

## 2020-02-09 ENCOUNTER — Encounter (INDEPENDENT_AMBULATORY_CARE_PROVIDER_SITE_OTHER): Payer: Self-pay | Admitting: Bariatrics

## 2020-02-09 NOTE — Progress Notes (Signed)
Chief Complaint:   OBESITY Tracy Brown is here to discuss her progress with her obesity treatment plan along with follow-up of her obesity related diagnoses. Tracy Brown is on the Category 1 Plan and states she is following her eating plan approximately 80% of the time. Tracy Brown states she is walking 2.5 miles 2-3 times per week.  Today's visit was #: 18 Starting weight: 168 lbs Starting date: 12/30/2018 Today's weight: 132 lbs Today's date: 02/08/2020 Total lbs lost to date: 36 Total lbs lost since last in-office visit: 0  Interim History: Tracy Brown's weight remains the same. She has done well with weight loss and reports adequate protein intake.  Subjective:   Other specified hypothyroidism. Tracy Brown is taking Synthroid  Lab Results  Component Value Date   TSH 0.404 (L) 12/07/2019   Insulin resistance. Tracy Brown has a diagnosis of insulin resistance based on her elevated fasting insulin level >5. She continues to work on diet and exercise to decrease her risk of diabetes. She is taking metformin.  Lab Results  Component Value Date   INSULIN 5.4 12/07/2019   INSULIN 16.7 12/30/2018   Lab Results  Component Value Date   HGBA1C 5.4 12/07/2019   Assessment/Plan:   Other specified hypothyroidism. Patient with long-standing hypothyroidism, on levothyroxine therapy. She appears euthyroid. Orders and follow up as documented in patient record. Tracy Brown will continue Synthroid as directed.  Counseling . Good thyroid control is important for overall health. Supratherapeutic thyroid levels are dangerous and will not improve weight loss results. . The correct way to take levothyroxine is fasting, with water, separated by at least 30 minutes from breakfast, and separated by more than 4 hours from calcium, iron, multivitamins, acid reflux medications (PPIs).   Insulin resistance. Tracy Brown will continue to work on weight loss, exercise, and decreasing simple carbohydrates to help decrease the risk  of diabetes. Tracy Brown agreed to follow-up with [name] as directed to closely monitor her progress. She will continue metformin as directed.  Class 1 obesity with serious comorbidity and body mass index (BMI) of 30.0 to 30.9 in adult, unspecified obesity type - BMI greater than 30 at start.  Tracy Brown is currently in the action stage of change. As such, her goal is to continue with weight loss efforts. She has agreed to the Category 1 Plan.   She will work on meal planning, intentional eating, and will take calcium 1200 mg daily.  Exercise goals: Tracy Brown will continue to walk and will start using some weights.  Behavioral modification strategies: increasing lean protein intake, decreasing simple carbohydrates, increasing vegetables, increasing water intake, decreasing eating out, no skipping meals, meal planning and cooking strategies, keeping healthy foods in the home and planning for success.  Tracy Brown has agreed to follow-up with our clinic in 2 weeks. She was informed of the importance of frequent follow-up visits to maximize her success with intensive lifestyle modifications for her multiple health conditions.   Objective:   Blood pressure 120/84, pulse 74, temperature 98.1 F (36.7 C), height 5\' 1"  (1.549 m), weight 132 lb (59.9 kg), SpO2 96 %. Body mass index is 24.94 kg/m.  General: Cooperative, alert, well developed, in no acute distress. HEENT: Conjunctivae and lids unremarkable. Cardiovascular: Regular rhythm.  Lungs: Normal work of breathing. Neurologic: No focal deficits.   Lab Results  Component Value Date   CREATININE 0.77 12/07/2019   BUN 22 12/07/2019   NA 142 12/07/2019   K 4.4 12/07/2019   CL 106 12/07/2019   CO2 24 12/07/2019  Lab Results  Component Value Date   ALT 18 12/07/2019   AST 15 12/07/2019   ALKPHOS 61 12/07/2019   BILITOT 0.2 12/07/2019   Lab Results  Component Value Date   HGBA1C 5.4 12/07/2019   HGBA1C 5.4 12/30/2018   Lab Results  Component Value  Date   INSULIN 5.4 12/07/2019   INSULIN 16.7 12/30/2018   Lab Results  Component Value Date   TSH 0.404 (L) 12/07/2019   Lab Results  Component Value Date   CHOL 193 12/07/2019   HDL 60 12/07/2019   LDLCALC 119 (H) 12/07/2019   TRIG 76 12/07/2019   CHOLHDL 4 04/02/2019   Lab Results  Component Value Date   WBC 6.1 04/02/2019   HGB 13.2 04/02/2019   HCT 38.7 04/02/2019   MCV 84.3 04/02/2019   PLT 209.0 04/02/2019   No results found for: IRON, TIBC, FERRITIN  Attestation Statements:   Reviewed by clinician on day of visit: allergies, medications, problem list, medical history, surgical history, family history, social history, and previous encounter notes.  Time spent on visit including pre-visit chart review and post-visit charting and care was 20 minutes.   Migdalia Dk, am acting as Location manager for [employer], DO   I have reviewed the above documentation for accuracy and completeness, and I agree with the above. Jearld Lesch, DO

## 2020-02-16 ENCOUNTER — Other Ambulatory Visit (INDEPENDENT_AMBULATORY_CARE_PROVIDER_SITE_OTHER): Payer: Self-pay | Admitting: Bariatrics

## 2020-02-16 ENCOUNTER — Other Ambulatory Visit: Payer: Self-pay | Admitting: Family Medicine

## 2020-02-16 DIAGNOSIS — E8881 Metabolic syndrome: Secondary | ICD-10-CM

## 2020-02-18 LAB — LIPID PANEL
Chol/HDL Ratio: 2.5 ratio (ref 0.0–4.4)
Cholesterol, Total: 146 mg/dL (ref 100–199)
HDL: 59 mg/dL (ref >39)
LDL Chol Calc (NIH): 74 mg/dL (ref 0–99)
Triglycerides: 67 mg/dL (ref 0–149)
VLDL Cholesterol Cal: 13 mg/dL (ref 5–40)

## 2020-02-25 ENCOUNTER — Other Ambulatory Visit: Payer: Self-pay

## 2020-02-25 ENCOUNTER — Encounter: Payer: Self-pay | Admitting: Internal Medicine

## 2020-02-25 ENCOUNTER — Ambulatory Visit (INDEPENDENT_AMBULATORY_CARE_PROVIDER_SITE_OTHER): Payer: 59 | Admitting: Internal Medicine

## 2020-02-25 VITALS — BP 118/78 | HR 82 | Temp 98.9°F | Ht 61.0 in | Wt 136.0 lb

## 2020-02-25 DIAGNOSIS — M21619 Bunion of unspecified foot: Secondary | ICD-10-CM

## 2020-02-25 DIAGNOSIS — L608 Other nail disorders: Secondary | ICD-10-CM

## 2020-02-25 DIAGNOSIS — S91209A Unspecified open wound of unspecified toe(s) with damage to nail, initial encounter: Secondary | ICD-10-CM

## 2020-02-25 NOTE — Patient Instructions (Addendum)
TLD to  Nail bed and  Avoid  Trauma .   Bunion deformity  Can get worse .   If problem see podiatry     Bunion  A bunion is a bump on the base of the big toe that forms when the bones of the big toe joint move out of position. Bunions may be small at first, but they often get larger over time. They can make walking painful. What are the causes? A bunion may be caused by:  Wearing narrow or pointed shoes that force the big toe to press against the other toes.  Abnormal foot development that causes the foot to roll inward (pronate).  Changes in the foot that are caused by certain diseases, such as rheumatoid arthritis or polio.  A foot injury. What increases the risk? The following factors may make you more likely to develop this condition:  Wearing shoes that squeeze the toes together.  Having certain diseases, such as: ? Rheumatoid arthritis. ? Polio. ? Cerebral palsy.  Having family members who have bunions.  Being born with a foot deformity, such as flat feet or low arches.  Doing activities that put a lot of pressure on the feet, such as ballet dancing. What are the signs or symptoms? The main symptom of a bunion is a noticeable bump on the big toe. Other symptoms may include:  Pain.  Swelling around the big toe.  Redness and inflammation.  Thick or hardened skin on the big toe or between the toes.  Stiffness or loss of motion in the big toe.  Trouble with walking. How is this diagnosed? A bunion may be diagnosed based on your symptoms, medical history, and activities. You may have tests, such as:  X-rays. These allow your health care provider to check the position of the bones in your foot and look for damage to your joint. They also help your health care provider determine the severity of your bunion and the best way to treat it.  Joint aspiration. In this test, a sample of fluid is removed from the toe joint. This test may be done if you are in a lot of  pain. It helps rule out diseases that cause painful swelling of the joints, such as arthritis. How is this treated? Treatment depends on the severity of your symptoms. The goal of treatment is to relieve symptoms and prevent the bunion from getting worse. Your health care provider may recommend:  Wearing shoes that have a wide toe box.  Using bunion pads to cushion the affected area.  Taping your toes together to keep them in a normal position.  Placing a device inside your shoe (orthotics) to help reduce pressure on your toe joint.  Taking medicine to ease pain, inflammation, and swelling.  Applying heat or ice to the affected area.  Doing stretching exercises.  Surgery to remove scar tissue and move the toes back into their normal position. This treatment is rare. Follow these instructions at home: Managing pain, stiffness, and swelling   If directed, put ice on the painful area: ? Put ice in a plastic bag. ? Place a towel between your skin and the bag. ? Leave the ice on for 20 minutes, 2-3 times a day. Activity   If directed, apply heat to the affected area before you exercise. Use the heat source that your health care provider recommends, such as a moist heat pack or a heating pad. ? Place a towel between your skin and the heat  source. ? Leave the heat on for 20-30 minutes. ? Remove the heat if your skin turns bright red. This is especially important if you are unable to feel pain, heat, or cold. You may have a greater risk of getting burned.  Do exercises as told by your health care provider. General instructions  Support your toe joint with proper footwear, shoe padding, or taping as told by your health care provider.  Take over-the-counter and prescription medicines only as told by your health care provider.  Keep all follow-up visits as told by your health care provider. This is important. Contact a health care provider if your symptoms:  Get worse.  Do not  improve in 2 weeks. Get help right away if you have:  Severe pain and trouble with walking. Summary  A bunion is a bump on the base of the big toe that forms when the bones of the big toe joint move out of position.  Bunions can make walking painful.  Treatment depends on the severity of your symptoms.  Support your toe joint with proper footwear, shoe padding, or taping as told by your health care provider. This information is not intended to replace advice given to you by your health care provider. Make sure you discuss any questions you have with your health care provider. Document Revised: 02/02/2018 Document Reviewed: 12/09/2017 Elsevier Patient Education  [place].

## 2020-02-25 NOTE — Progress Notes (Signed)
Chief Complaint  Patient presents with  . Toe Pain    Toe nail coming off, right middle toe, no injury    HPI: Tracy Brown 59 y.o. come in for[facility]A PCP appt  NA Today putting on sock middle toe right  Nail partially avulsed without pain bleeding   Toes usually painted  No recent pedicure  Nail is  Off on whole lateral side   Med thyroid  metofmin and lipid meds Also has some prominent   mtp area ocass discomfort  No hx of bunion  But father and sibs do  ROS: See pertinent positives and negatives per HPI.  Past Medical History:  Diagnosis Date  . Allergy   . Anemia   . Chickenpox   . Colon polyps   . Diverticulosis   . Heart murmur   . History of recurrent UTIs   . Hyperlipidemia   . Hypertension   . Hypothyroid   . Nephrolithiasis   . OA (osteoarthritis)   . Serrated adenoma of colon 02/2017    Family History  Problem Relation Age of Onset  . Cancer Other        breast,colon,ovarian  . Coronary artery disease Other   . Hypertension Other   . Stroke Other   . Leukemia Father   . Cancer Father   . Breast cancer Maternal Grandfather   . Breast cancer Cousin   . Thyroid disease Mother   . Hypertension Mother   . Stroke Mother     Social History   Socioeconomic History  . Marital status: Divorced    Spouse name: Not on file  . Number of children: 1  . Years of education: Not on file  . Highest education level: Not on file  Occupational History  . Not on file  Tobacco Use  . Smoking status: Former Smoker    Packs/day: 1.00    Years: 37.00    Pack years: 37.00    Quit date: 10/10/2016    Years since quitting: 3.3  . Smokeless tobacco: Never Used  Vaping Use  . Vaping Use: Never used  Substance and Sexual Activity  . Alcohol use: Yes    Alcohol/week: 0.0 standard drinks    Comment: once a month  . Drug use: No  . Sexual activity: Yes  Other Topics Concern  . Not on file  Social History Narrative  . Not on file   Social Determinants of  Health   Financial Resource Strain:   . Difficulty of Paying Living Expenses:   Food Insecurity:   . Worried About Charity fundraiser in the Last Year:   . Arboriculturist in the Last Year:   Transportation Needs:   . Film/video editor (Medical):   Marland Kitchen Lack of Transportation (Non-Medical):   Physical Activity:   . Days of Exercise per Week:   . Minutes of Exercise per Session:   Stress:   . Feeling of Stress :   Social Connections:   . Frequency of Communication with Friends and Family:   . Frequency of Social Gatherings with Friends and Family:   . Attends Religious Services:   . Active Member of Clubs or Organizations:   . Attends Archivist Meetings:   Marland Kitchen Marital Status:     Outpatient Medications Prior to Visit  Medication Sig Dispense Refill  . Ascorbic Acid (VITAMIN C) 1000 MG tablet Take 1,000 mg by mouth daily.    Marland Kitchen aspirin 81 MG chewable  tablet Chew 81 mg by mouth daily.    Marland Kitchen atorvastatin (LIPITOR) 40 MG tablet Take 40 mg every day after dinner 90 tablet 3  . cetirizine (ZYRTEC) 10 MG tablet Take 10 mg by mouth daily.    Marland Kitchen ibuprofen (ADVIL,MOTRIN) 200 MG tablet Take 200 mg by mouth every 6 (six) hours as needed.    Marland Kitchen levothyroxine (SYNTHROID) 100 MCG tablet Take 100 mcg by mouth daily.    . metFORMIN (GLUCOPHAGE) 500 MG tablet TAKE 1 TABLET BY MOUTH EVERY DAY WITH BREAKFAST 30 tablet 0  . MULTIPLE VITAMINS PO Take by mouth.    . Vitamin D, Ergocalciferol, (DRISDOL) 1.25 MG (50000 UNIT) CAPS capsule Take 1 capsule (50,000 Units total) by mouth every 14 (fourteen) days. 6 capsule 0  . zinc gluconate 50 MG tablet Take 50 mg by mouth daily.    Marland Kitchen levothyroxine (SYNTHROID) 88 MCG tablet Take 1 tablet (88 mcg total) by mouth daily before breakfast. (Patient not taking: Reported on 02/25/2020) 30 tablet 0   No facility-administered medications prior to visit.     EXAM:  BP 118/78   Pulse 82   Temp 98.9 F (37.2 C) (Temporal)   Ht 5\' 1"  (1.549 m)   Wt 136  lb (61.7 kg)   SpO2 98%   BMI 25.70 kg/m   Body mass index is 25.7 kg/m.  GENERAL: vitals reviewed and listed above, alert, oriented, appears well hydrated and in no acute distress HEENT: atraumatic, conjunctiva  clear, no obvious abnormalities on inspection of external nose and ears OP : masked  NECK: no obvious masses on inspection palpation   CV: HRRR, no clubbing cyanosis or  peripheral edema nl cap refill  MS: moves all extremities without noticeable focal  Abnormality 1+ bunion   Left more then right  Toes painted  Middle right with some thickening and lifted  From cuticle base  Laterally  But nail base  Normal    No rednss bleeding or brusing  PSYCH: pleasant and cooperative, no obvious depression or anxiety  BP Readings from Last 3 Encounters:  02/25/20 118/78  02/08/20 120/84  01/11/20 111/70    ASSESSMENT AND PLAN:  Discussed the following assessment and plan:  Nail avulsion of toe, initial encounter - minimal "trauma"   Toenail deformity  Bunion Take off polish  Local care   And should grow out and fll off  ? If related to  Fungal or other nail deformity but nail bed looks ok .  Foot care  consider seeing podiatry if bunion getting worse of painful  -Patient advised to return or notify health care team  if  new concerns arise.  Patient Instructions  TLD to  Nail bed and  Avoid  Trauma .   Bunion deformity  Can get worse .   If problem see podiatry     Bunion  A bunion is a bump on the base of the big toe that forms when the bones of the big toe joint move out of position. Bunions may be small at first, but they often get larger over time. They can make walking painful. What are the causes? A bunion may be caused by:  Wearing narrow or pointed shoes that force the big toe to press against the other toes.  Abnormal foot development that causes the foot to roll inward (pronate).  Changes in the foot that are caused by certain diseases, such as rheumatoid  arthritis or polio.  A foot injury. What increases the  risk? The following factors may make you more likely to develop this condition:  Wearing shoes that squeeze the toes together.  Having certain diseases, such as: ? Rheumatoid arthritis. ? Polio. ? Cerebral palsy.  Having family members who have bunions.  Being born with a foot deformity, such as flat feet or low arches.  Doing activities that put a lot of pressure on the feet, such as ballet dancing. What are the signs or symptoms? The main symptom of a bunion is a noticeable bump on the big toe. Other symptoms may include:  Pain.  Swelling around the big toe.  Redness and inflammation.  Thick or hardened skin on the big toe or between the toes.  Stiffness or loss of motion in the big toe.  Trouble with walking. How is this diagnosed? A bunion may be diagnosed based on your symptoms, medical history, and activities. You may have tests, such as:  X-rays. These allow your health care provider to check the position of the bones in your foot and look for damage to your joint. They also help your health care provider determine the severity of your bunion and the best way to treat it.  Joint aspiration. In this test, a sample of fluid is removed from the toe joint. This test may be done if you are in a lot of pain. It helps rule out diseases that cause painful swelling of the joints, such as arthritis. How is this treated? Treatment depends on the severity of your symptoms. The goal of treatment is to relieve symptoms and prevent the bunion from getting worse. Your health care provider may recommend:  Wearing shoes that have a wide toe box.  Using bunion pads to cushion the affected area.  Taping your toes together to keep them in a normal position.  Placing a device inside your shoe (orthotics) to help reduce pressure on your toe joint.  Taking medicine to ease pain, inflammation, and swelling.  Applying heat or ice  to the affected area.  Doing stretching exercises.  Surgery to remove scar tissue and move the toes back into their normal position. This treatment is rare. Follow these instructions at home: Managing pain, stiffness, and swelling   If directed, put ice on the painful area: ? Put ice in a plastic bag. ? Place a towel between your skin and the bag. ? Leave the ice on for 20 minutes, 2-3 times a day. Activity   If directed, apply heat to the affected area before you exercise. Use the heat source that your health care provider recommends, such as a moist heat pack or a heating pad. ? Place a towel between your skin and the heat source. ? Leave the heat on for 20-30 minutes. ? Remove the heat if your skin turns bright red. This is especially important if you are unable to feel pain, heat, or cold. You may have a greater risk of getting burned.  Do exercises as told by your health care provider. General instructions  Support your toe joint with proper footwear, shoe padding, or taping as told by your health care provider.  Take over-the-counter and prescription medicines only as told by your health care provider.  Keep all follow-up visits as told by your health care provider. This is important. Contact a health care provider if your symptoms:  Get worse.  Do not improve in 2 weeks. Get help right away if you have:  Severe pain and trouble with walking. Summary  A bunion is  a bump on the base of the big toe that forms when the bones of the big toe joint move out of position.  Bunions can make walking painful.  Treatment depends on the severity of your symptoms.  Support your toe joint with proper footwear, shoe padding, or taping as told by your health care provider. This information is not intended to replace advice given to you by your health care provider. Make sure you discuss any questions you have with your health care provider. Document Revised: 02/02/2018 Document  Reviewed: 12/09/2017 Elsevier Patient Education  2020 [place] Garrie Elenes M.D.

## 2020-03-07 ENCOUNTER — Other Ambulatory Visit: Payer: Self-pay

## 2020-03-07 ENCOUNTER — Ambulatory Visit (INDEPENDENT_AMBULATORY_CARE_PROVIDER_SITE_OTHER): Payer: 59 | Admitting: Bariatrics

## 2020-03-07 ENCOUNTER — Encounter (INDEPENDENT_AMBULATORY_CARE_PROVIDER_SITE_OTHER): Payer: Self-pay | Admitting: Bariatrics

## 2020-03-07 VITALS — BP 113/80 | HR 79 | Temp 98.4°F | Ht 61.0 in | Wt 132.0 lb

## 2020-03-07 DIAGNOSIS — Z9189 Other specified personal risk factors, not elsewhere classified: Secondary | ICD-10-CM | POA: Diagnosis not present

## 2020-03-07 DIAGNOSIS — E559 Vitamin D deficiency, unspecified: Secondary | ICD-10-CM

## 2020-03-07 DIAGNOSIS — E038 Other specified hypothyroidism: Secondary | ICD-10-CM

## 2020-03-07 DIAGNOSIS — E7849 Other hyperlipidemia: Secondary | ICD-10-CM | POA: Diagnosis not present

## 2020-03-07 DIAGNOSIS — E669 Obesity, unspecified: Secondary | ICD-10-CM

## 2020-03-07 DIAGNOSIS — Z6830 Body mass index (BMI) 30.0-30.9, adult: Secondary | ICD-10-CM

## 2020-03-07 MED ORDER — VITAMIN D (ERGOCALCIFEROL) 1.25 MG (50000 UNIT) PO CAPS: 50000.0000 [IU] | ORAL_CAPSULE | ORAL | 0 refills | Status: DC

## 2020-03-08 ENCOUNTER — Encounter (INDEPENDENT_AMBULATORY_CARE_PROVIDER_SITE_OTHER): Payer: Self-pay | Admitting: Bariatrics

## 2020-03-08 ENCOUNTER — Other Ambulatory Visit (INDEPENDENT_AMBULATORY_CARE_PROVIDER_SITE_OTHER): Payer: Self-pay | Admitting: Bariatrics

## 2020-03-08 LAB — T4, FREE: Free T4: 1.7 ng/dL (ref 0.82–1.77)

## 2020-03-08 LAB — COMPREHENSIVE METABOLIC PANEL
ALT: 30 IU/L (ref 0–32)
AST: 20 IU/L (ref 0–40)
Albumin/Globulin Ratio: 1.9 (ref 1.2–2.2)
Albumin: 4.7 g/dL (ref 3.8–4.9)
Alkaline Phosphatase: 71 IU/L (ref 48–121)
BUN/Creatinine Ratio: 30 — ABNORMAL HIGH (ref 9–23)
BUN: 21 mg/dL (ref 6–24)
Bilirubin Total: 0.2 mg/dL (ref 0.0–1.2)
CO2: 22 mmol/L (ref 20–29)
Calcium: 10.2 mg/dL (ref 8.7–10.2)
Chloride: 102 mmol/L (ref 96–106)
Creatinine, Ser: 0.69 mg/dL (ref 0.57–1.00)
GFR calc Af Amer: 111 mL/min/{1.73_m2} (ref >59)
GFR calc non Af Amer: 96 mL/min/{1.73_m2} (ref >59)
Globulin, Total: 2.5 g/dL (ref 1.5–4.5)
Glucose: 86 mg/dL (ref 65–99)
Potassium: 4.2 mmol/L (ref 3.5–5.2)
Sodium: 138 mmol/L (ref 134–144)
Total Protein: 7.2 g/dL (ref 6.0–8.5)

## 2020-03-08 LAB — TSH: TSH: 0.104 u[IU]/mL — ABNORMAL LOW (ref 0.450–4.500)

## 2020-03-08 LAB — VITAMIN D 25 HYDROXY (VIT D DEFICIENCY, FRACTURES): Vit D, 25-Hydroxy: 67.7 ng/mL (ref 30.0–100.0)

## 2020-03-08 LAB — LIPID PANEL WITH LDL/HDL RATIO
Cholesterol, Total: 134 mg/dL (ref 100–199)
HDL: 61 mg/dL (ref >39)
LDL Chol Calc (NIH): 54 mg/dL (ref 0–99)
LDL/HDL Ratio: 0.9 ratio (ref 0.0–3.2)
Triglycerides: 103 mg/dL (ref 0–149)
VLDL Cholesterol Cal: 19 mg/dL (ref 5–40)

## 2020-03-08 LAB — T3: T3, Total: 82 ng/dL (ref 71–180)

## 2020-03-08 MED ORDER — LEVOTHYROXINE SODIUM 88 MCG PO TABS: 88.0000 ug | ORAL_TABLET | Freq: Every day | ORAL | 0 refills | Status: DC

## 2020-03-08 NOTE — Progress Notes (Signed)
Chief Complaint:   OBESITY Tracy Brown is here to discuss her progress with her obesity treatment plan along with follow-up of her obesity related diagnoses. Tracy Brown is on the Category 1 Plan and states she is following her eating plan approximately 80% of the time.  Today's visit was #: 41 Starting weight: 168 lbs Starting date: 12/30/2018 Today's weight: 132 lbs Today's date: 03/07/2020 Total lbs lost to date: 36 Total lbs lost since last in-office visit: 0  Interim History: Tracy Brown's weight remains the same.  Subjective:   Vitamin D deficiency. No nausea, vomiting, or muscle weakness.    Ref. Range 12/07/2019 07:38  Vitamin D, 25-Hydroxy Latest Ref Range: 30.0 - 100.0 ng/mL 57.6   Other specified hypothyroidism. Tracy Brown is taking Synthroid.  Lab Results  Component Value Date   TSH 0.104 (L) 03/07/2020   Other hyperlipidemia. Tracy Brown is taking Lipitor.   Lab Results  Component Value Date   CHOL 134 03/07/2020   HDL 61 03/07/2020   LDLCALC 54 03/07/2020   TRIG 103 03/07/2020   CHOLHDL 2.5 02/18/2020   Lab Results  Component Value Date   ALT 30 03/07/2020   AST 20 03/07/2020   ALKPHOS 71 03/07/2020   BILITOT 0.2 03/07/2020   The 10-year ASCVD risk score Mikey Bussing DC Jr., et al., 2013) is: 1.4%   Values used to calculate the score:     Age: 59 years     Sex: Female     Is Non-Hispanic African American: No     Diabetic: No     Tobacco smoker: No     Systolic Blood Pressure: 161 mmHg     Is BP treated: No     HDL Cholesterol: 61 mg/dL     Total Cholesterol: 134 mg/dL  At risk for heart disease. Tracy Brown is at a higher than average risk for cardiovascular disease due to hyperlipidemia.   Assessment/Plan:   Vitamin D deficiency. Low Vitamin D level contributes to fatigue and are associated with obesity, breast, and colon cancer. She was given a prescription for Vitamin D, Ergocalciferol, (DRISDOL) 1.25 MG (50000 UNIT) CAPS capsule every 14 days #2 with 0 refills  and VITAMIN D 25 Hydroxy (Vit-D Deficiency, Fractures) level was ordered today.  Other specified hypothyroidism. Patient with long-standing hypothyroidism, on levothyroxine therapy. She appears euthyroid. Orders and follow up as documented in patient record. Tracy Brown will continue Synthroid as directed. T3, T4, free, TSH levels were ordered today.  Counseling . Good thyroid control is important for overall health. Supratherapeutic thyroid levels are dangerous and will not improve weight loss results. . The correct way to take levothyroxine is fasting, with water, separated by at least 30 minutes from breakfast, and separated by more than 4 hours from calcium, iron, multivitamins, acid reflux medications (PPIs).    Other hyperlipidemia. Cardiovascular risk and specific lipid/LDL goals reviewed.  We discussed several lifestyle modifications today and Tracy Brown will continue to work on diet, exercise and weight loss efforts. Orders and follow up as documented in patient record. Tracy Brown will continue Lipitor as directed and will add MCT Oil 2 times per week. She will avoid trans fat and increase PUFA's and MUFA's. Lipid Panel With LDL/HDL Ratio labs ordered today.  Counseling Intensive lifestyle modifications are the first line treatment for this issue. . Dietary changes: Increase soluble fiber. Decrease simple carbohydrates. . Exercise changes: Moderate to vigorous-intensity aerobic activity 150 minutes per week if tolerated. . Lipid-lowering medications: see documented in medical record.  At risk for heart disease. Tracy Brown was given approximately 15 minutes of coronary artery disease prevention counseling today. She is 59 y.o. female and has risk factors for heart disease including obesity. We discussed intensive lifestyle modifications today with an emphasis on specific weight loss instructions and strategies.   Repetitive spaced learning was employed today to elicit superior memory formation and behavioral  change.  Class 1 obesity with serious comorbidity and body mass index (BMI) of 30.0 to 30.9 in adult, unspecified obesity type - BMI greater than 30 at start.  Tracy Brown is currently in the action stage of change. As such, her goal is to continue with weight loss efforts. She has agreed to the Category 1 Plan.   She will work on meal planning and intentional eating.   Exercise goals: Tracy Brown will walk 2 miles 4 times per week. She will add in muscle building exercises and focus less on weight loss and more on muscle building.  Behavioral modification strategies: increasing lean protein intake, decreasing simple carbohydrates, increasing vegetables, increasing water intake, decreasing eating out, no skipping meals, meal planning and cooking strategies, keeping healthy foods in the home and planning for success.  Tracy Brown has agreed to follow-up with our clinic in 2-3 weeks. She was informed of the importance of frequent follow-up visits to maximize her success with intensive lifestyle modifications for her multiple health conditions.   Tracy Brown was informed we would discuss her lab results at her next visit unless there is a critical issue that needs to be addressed sooner. Tracy Brown agreed to keep her next visit at the agreed upon time to discuss these results.  Objective:   Blood pressure 113/80, pulse 79, temperature 98.4 F (36.9 C), height 5\' 1"  (1.549 m), weight 132 lb (59.9 kg), SpO2 96 %. Body mass index is 24.94 kg/m.  General: Cooperative, alert, well developed, in no acute distress. HEENT: Conjunctivae and lids unremarkable. Cardiovascular: Regular rhythm.  Lungs: Normal work of breathing. Neurologic: No focal deficits.   Lab Results  Component Value Date   CREATININE 0.69 03/07/2020   BUN 21 03/07/2020   NA 138 03/07/2020   K 4.2 03/07/2020   CL 102 03/07/2020   CO2 22 03/07/2020   Lab Results  Component Value Date   ALT 30 03/07/2020   AST 20 03/07/2020   ALKPHOS 71 03/07/2020     BILITOT 0.2 03/07/2020   Lab Results  Component Value Date   HGBA1C 5.4 12/07/2019   HGBA1C 5.4 12/30/2018   Lab Results  Component Value Date   INSULIN 5.4 12/07/2019   INSULIN 16.7 12/30/2018   Lab Results  Component Value Date   TSH 0.104 (L) 03/07/2020   Lab Results  Component Value Date   CHOL 134 03/07/2020   HDL 61 03/07/2020   LDLCALC 54 03/07/2020   TRIG 103 03/07/2020   CHOLHDL 2.5 02/18/2020   Lab Results  Component Value Date   WBC 6.1 04/02/2019   HGB 13.2 04/02/2019   HCT 38.7 04/02/2019   MCV 84.3 04/02/2019   PLT 209.0 04/02/2019   No results found for: IRON, TIBC, FERRITIN  Attestation Statements:   Reviewed by clinician on day of visit: allergies, medications, problem list, medical history, surgical history, family history, social history, and previous encounter notes.  Migdalia Dk, am acting as Location manager for [employer], DO   I have reviewed the above documentation for accuracy and completeness, and I agree with the above. Jearld Lesch, DO

## 2020-03-08 NOTE — Progress Notes (Signed)
Pt states ok to send rx but will check PCP prior to getting Rx.

## 2020-03-09 ENCOUNTER — Encounter: Payer: Self-pay | Admitting: Family Medicine

## 2020-03-10 ENCOUNTER — Encounter (INDEPENDENT_AMBULATORY_CARE_PROVIDER_SITE_OTHER): Payer: Self-pay | Admitting: Bariatrics

## 2020-03-13 NOTE — Telephone Encounter (Signed)
Please review

## 2020-03-14 NOTE — Telephone Encounter (Signed)
After looking at the labs, I agree that the Synthroid dose is a little too high. I agree with lowering this to 88 mcg daily

## 2020-04-03 ENCOUNTER — Other Ambulatory Visit (INDEPENDENT_AMBULATORY_CARE_PROVIDER_SITE_OTHER): Payer: Self-pay | Admitting: Bariatrics

## 2020-04-04 ENCOUNTER — Ambulatory Visit (INDEPENDENT_AMBULATORY_CARE_PROVIDER_SITE_OTHER): Payer: 59 | Admitting: Bariatrics

## 2020-04-04 ENCOUNTER — Other Ambulatory Visit: Payer: Self-pay

## 2020-04-04 ENCOUNTER — Encounter (INDEPENDENT_AMBULATORY_CARE_PROVIDER_SITE_OTHER): Payer: Self-pay | Admitting: Bariatrics

## 2020-04-04 VITALS — BP 112/75 | HR 61 | Temp 98.2°F | Ht 61.0 in | Wt 134.0 lb

## 2020-04-04 DIAGNOSIS — Z9189 Other specified personal risk factors, not elsewhere classified: Secondary | ICD-10-CM | POA: Diagnosis not present

## 2020-04-04 DIAGNOSIS — E559 Vitamin D deficiency, unspecified: Secondary | ICD-10-CM

## 2020-04-04 DIAGNOSIS — Z6830 Body mass index (BMI) 30.0-30.9, adult: Secondary | ICD-10-CM

## 2020-04-04 DIAGNOSIS — E8881 Metabolic syndrome: Secondary | ICD-10-CM | POA: Diagnosis not present

## 2020-04-04 DIAGNOSIS — E669 Obesity, unspecified: Secondary | ICD-10-CM | POA: Diagnosis not present

## 2020-04-04 MED ORDER — METFORMIN HCL 500 MG PO TABS: 500.0000 mg | ORAL_TABLET | Freq: Every day | ORAL | 0 refills | Status: DC

## 2020-04-04 MED ORDER — VITAMIN D (ERGOCALCIFEROL) 1.25 MG (50000 UNIT) PO CAPS: 50000.0000 [IU] | ORAL_CAPSULE | ORAL | 0 refills | Status: DC

## 2020-04-05 ENCOUNTER — Encounter (INDEPENDENT_AMBULATORY_CARE_PROVIDER_SITE_OTHER): Payer: Self-pay | Admitting: Bariatrics

## 2020-04-05 NOTE — Progress Notes (Signed)
Chief Complaint:   OBESITY Tracy Brown is here to discuss her progress with her obesity treatment plan along with follow-up of her obesity related diagnoses. Tracy Brown is on the Category 1 Plan and states she is following her eating plan approximately 75% of the time. Tracy Brown states she is walking 45 minutes 4 times per week.  Today's visit was #: 20 Starting weight: 168 lbs Starting date: 12/30/2018 Today's weight: 134 lbs Today's date: 04/04/2020 Total lbs lost to date: 34 Total lbs lost since last in-office visit: 0  Interim History: Tracy Brown is up 2 lbs since her last visit. She states she has been on vacation.  Subjective:   Vitamin D deficiency. No nausea, vomiting, or muscle weakness.    Ref. Range 03/07/2020 16:46  Vitamin D, 25-Hydroxy Latest Ref Range: 30.0 - 100.0 ng/mL 67.7   Insulin resistance. Tracy Brown has a diagnosis of insulin resistance based on her elevated fasting insulin level >5. She continues to work on diet and exercise to decrease her risk of diabetes. No polyphagia.  Lab Results  Component Value Date   INSULIN 5.4 12/07/2019   INSULIN 16.7 12/30/2018   Lab Results  Component Value Date   HGBA1C 5.4 12/07/2019   At risk for osteoporosis. Tracy Brown is at higher risk of osteopenia and osteoporosis due to Vitamin D deficiency.   Assessment/Plan:   Vitamin D deficiency. Low Vitamin D level contributes to fatigue and are associated with obesity, breast, and colon cancer. She was given a prescription for Vitamin D, Ergocalciferol, (DRISDOL) 1.25 MG (50000 UNIT) CAPS capsule every 14 days #2 with 0 refills and will follow-up for routine testing of Vitamin D, at least 2-3 times per year to avoid over-replacement.   Insulin resistance. Tracy Brown will continue to work on weight loss, exercise, and decreasing simple carbohydrates to help decrease the risk of diabetes. Tracy Brown agreed to follow-up with [name] as directed to closely monitor her progress. Prescription was given  for metFORMIN (GLUCOPHAGE) 500 MG tablet 1 daily with breakfast #30 with 0 refills.  At risk for osteoporosis. Tracy Brown was given approximately 5 minutes of osteoporosis prevention counseling today. Tracy Brown is at risk for osteopenia and osteoporosis due to her Vitamin D deficiency. She was encouraged to take her Vitamin D and follow her higher calcium diet and increase strengthening exercise to help strengthen her bones and decrease her risk of osteopenia and osteoporosis.  Repetitive spaced learning was employed today to elicit superior memory formation and behavioral change.  Class 1 obesity with serious comorbidity and body mass index (BMI) of 30.0 to 30.9 in adult, unspecified obesity type - BMI greater than 30 at start.  Tracy Brown is currently in the action stage of change. As such, her goal is to continue with weight loss efforts. She has agreed to the Category 1 Plan.   She will work on meal planning and intentional eating.   Exercise goals: Tracy Brown will continue walking and using weights and resistance for exercise.  Behavioral modification strategies: increasing lean protein intake, decreasing simple carbohydrates, increasing vegetables, increasing water intake, decreasing eating out, no skipping meals, meal planning and cooking strategies, keeping healthy foods in the home and planning for success.  Tracy Brown has agreed to follow-up with our clinic in 2-3 weeks. She was informed of the importance of frequent follow-up visits to maximize her success with intensive lifestyle modifications for her multiple health conditions.   Objective:   Blood pressure 112/75, pulse 61, temperature 98.2 F (36.8 C), height 5\' 1"  (  1.549 m), weight 134 lb (60.8 kg), SpO2 97 %. Body mass index is 25.32 kg/m.  General: Cooperative, alert, well developed, in no acute distress. HEENT: Conjunctivae and lids unremarkable. Cardiovascular: Regular rhythm.  Lungs: Normal work of breathing. Neurologic: No focal deficits.     Lab Results  Component Value Date   CREATININE 0.69 03/07/2020   BUN 21 03/07/2020   NA 138 03/07/2020   K 4.2 03/07/2020   CL 102 03/07/2020   CO2 22 03/07/2020   Lab Results  Component Value Date   ALT 30 03/07/2020   AST 20 03/07/2020   ALKPHOS 71 03/07/2020   BILITOT 0.2 03/07/2020   Lab Results  Component Value Date   HGBA1C 5.4 12/07/2019   HGBA1C 5.4 12/30/2018   Lab Results  Component Value Date   INSULIN 5.4 12/07/2019   INSULIN 16.7 12/30/2018   Lab Results  Component Value Date   TSH 0.104 (L) 03/07/2020   Lab Results  Component Value Date   CHOL 134 03/07/2020   HDL 61 03/07/2020   LDLCALC 54 03/07/2020   TRIG 103 03/07/2020   CHOLHDL 2.5 02/18/2020   Lab Results  Component Value Date   WBC 6.1 04/02/2019   HGB 13.2 04/02/2019   HCT 38.7 04/02/2019   MCV 84.3 04/02/2019   PLT 209.0 04/02/2019   No results found for: IRON, TIBC, FERRITIN  Attestation Statements:   Reviewed by clinician on day of visit: allergies, medications, problem list, medical history, surgical history, family history, social history, and previous encounter notes.  Migdalia Dk, am acting as Location manager for [employer], DO   I have reviewed the above documentation for accuracy and completeness, and I agree with the above. Jearld Lesch, DO

## 2020-04-06 ENCOUNTER — Encounter: Payer: Self-pay | Admitting: Family Medicine

## 2020-04-07 ENCOUNTER — Encounter: Payer: Self-pay | Admitting: Family Medicine

## 2020-04-07 ENCOUNTER — Other Ambulatory Visit: Payer: Self-pay

## 2020-04-07 ENCOUNTER — Ambulatory Visit (INDEPENDENT_AMBULATORY_CARE_PROVIDER_SITE_OTHER): Payer: 59 | Admitting: Family Medicine

## 2020-04-07 VITALS — BP 130/60 | HR 74 | Temp 98.2°F | Ht 61.0 in | Wt 135.6 lb

## 2020-04-07 DIAGNOSIS — E8881 Metabolic syndrome: Secondary | ICD-10-CM | POA: Diagnosis not present

## 2020-04-07 DIAGNOSIS — Z0000 Encounter for general adult medical examination without abnormal findings: Secondary | ICD-10-CM

## 2020-04-07 MED ORDER — LEVOTHYROXINE SODIUM 88 MCG PO TABS: 88.0000 ug | ORAL_TABLET | Freq: Every day | ORAL | 3 refills | Status: DC

## 2020-04-07 NOTE — Progress Notes (Signed)
   Subjective:    Patient ID: Nikki Glanzer, female    DOB: 25-Jul-1961, 59 y.o.   MRN: [id number]  HPI Here for a well exam. She feels fine. She has been seeing [facility].    Review of Systems  Constitutional: Negative.   HENT: Negative.   Eyes: Negative.   Respiratory: Negative.   Cardiovascular: Negative.   Gastrointestinal: Negative.   Genitourinary: Negative for decreased urine volume, difficulty urinating, dyspareunia, dysuria, enuresis, flank pain, frequency, hematuria, pelvic pain and urgency.  Musculoskeletal: Negative.   Skin: Negative.   Neurological: Negative.   Psychiatric/Behavioral: Negative.        Objective:   Physical Exam Constitutional:      General: She is not in acute distress.    Appearance: She is well-developed.  HENT:     Head: Normocephalic and atraumatic.     Right Ear: External ear normal.     Left Ear: External ear normal.     Nose: Nose normal.     Mouth/Throat:     Pharynx: No oropharyngeal exudate.  Eyes:     General: No scleral icterus.    Conjunctiva/sclera: Conjunctivae normal.     Pupils: Pupils are equal, round, and reactive to light.  Neck:     Thyroid: No thyromegaly.     Vascular: No JVD.  Cardiovascular:     Rate and Rhythm: Normal rate and regular rhythm.     Heart sounds: Normal heart sounds. No murmur heard.  No friction rub. No gallop.   Pulmonary:     Effort: Pulmonary effort is normal. No respiratory distress.     Breath sounds: Normal breath sounds. No wheezing or rales.  Chest:     Chest wall: No tenderness.  Abdominal:     General: Bowel sounds are normal. There is no distension.     Palpations: Abdomen is soft. There is no mass.     Tenderness: There is no abdominal tenderness. There is no guarding or rebound.  Musculoskeletal:        General: No tenderness. Normal range of motion.     Cervical back: Normal range of motion and neck supple.  Lymphadenopathy:     Cervical: No  cervical adenopathy.  Skin:    General: Skin is warm and dry.     Findings: No erythema or rash.  Neurological:     Mental Status: She is alert and oriented to person, place, and time.     Cranial Nerves: No cranial nerve deficit.     Motor: No abnormal muscle tone.     Coordination: Coordination normal.     Deep Tendon Reflexes: Reflexes are normal and symmetric. Reflexes normal.  Psychiatric:        Behavior: Behavior normal.        Thought Content: Thought content normal.        Judgment: Judgment normal.           Assessment & Plan:  Well exam. We discussed diet and exercise. Get a CBC.  Alysia Penna, MD

## 2020-04-08 LAB — CBC WITH DIFFERENTIAL/PLATELET
Absolute Monocytes: 469 cells/uL (ref 200–950)
Basophils Absolute: 48 cells/uL (ref 0–200)
Basophils Relative: 0.7 %
Eosinophils Absolute: 88 cells/uL (ref 15–500)
Eosinophils Relative: 1.3 %
HCT: 39 % (ref 35.0–45.0)
Hemoglobin: 13 g/dL (ref 11.7–15.5)
Lymphs Abs: 2849 cells/uL (ref 850–3900)
MCH: 29.1 pg (ref 27.0–33.0)
MCHC: 33.3 g/dL (ref 32.0–36.0)
MCV: 87.2 fL (ref 80.0–100.0)
MPV: 10.9 fL (ref 7.5–12.5)
Monocytes Relative: 6.9 %
Neutro Abs: 3346 cells/uL (ref 1500–7800)
Neutrophils Relative %: 49.2 %
Platelets: 232 10*3/uL (ref 140–400)
RBC: 4.47 10*6/uL (ref 3.80–5.10)
RDW: 11.9 % (ref 11.0–15.0)
Total Lymphocyte: 41.9 %
WBC: 6.8 10*3/uL (ref 3.8–10.8)

## 2020-05-02 ENCOUNTER — Other Ambulatory Visit: Payer: Self-pay

## 2020-05-02 ENCOUNTER — Ambulatory Visit (INDEPENDENT_AMBULATORY_CARE_PROVIDER_SITE_OTHER): Payer: 59 | Admitting: Bariatrics

## 2020-05-02 ENCOUNTER — Encounter (INDEPENDENT_AMBULATORY_CARE_PROVIDER_SITE_OTHER): Payer: Self-pay | Admitting: Bariatrics

## 2020-05-02 VITALS — BP 96/66 | HR 63 | Temp 98.1°F | Ht 61.0 in | Wt 136.0 lb

## 2020-05-02 DIAGNOSIS — Z6830 Body mass index (BMI) 30.0-30.9, adult: Secondary | ICD-10-CM | POA: Diagnosis not present

## 2020-05-02 DIAGNOSIS — E8881 Metabolic syndrome: Secondary | ICD-10-CM | POA: Diagnosis not present

## 2020-05-02 DIAGNOSIS — Z9189 Other specified personal risk factors, not elsewhere classified: Secondary | ICD-10-CM | POA: Diagnosis not present

## 2020-05-02 DIAGNOSIS — E559 Vitamin D deficiency, unspecified: Secondary | ICD-10-CM | POA: Diagnosis not present

## 2020-05-02 DIAGNOSIS — E669 Obesity, unspecified: Secondary | ICD-10-CM | POA: Diagnosis not present

## 2020-05-02 MED ORDER — VITAMIN D (ERGOCALCIFEROL) 1.25 MG (50000 UNIT) PO CAPS: 50000.0000 [IU] | ORAL_CAPSULE | ORAL | 0 refills | Status: DC

## 2020-05-02 MED ORDER — METFORMIN HCL 500 MG PO TABS: 500.0000 mg | ORAL_TABLET | Freq: Every day | ORAL | 0 refills | Status: DC

## 2020-05-04 ENCOUNTER — Encounter (INDEPENDENT_AMBULATORY_CARE_PROVIDER_SITE_OTHER): Payer: Self-pay | Admitting: Bariatrics

## 2020-05-04 NOTE — Progress Notes (Signed)
Chief Complaint:   OBESITY Tracy Brown is here to discuss her progress with her obesity treatment plan along with follow-up of her obesity related diagnoses. Tracy Brown is on the Category 1 Plan and states she is following her eating plan approximately 80% of the time. Tracy Brown states she is walking 2 miles 5 times per week.  Today's visit was #: 21 Starting weight: 168 lbs Starting date: 12/30/2018 Today's weight: 136 lbs Today's date: 05/02/2020 Total lbs lost to date: 32 Total lbs lost since last in-office visit: 0  Interim History: Tracy Brown is up 2 lbs. She states she has been on vacation. She does report getting in adequate protein.  Subjective:   Insulin resistance. Tracy Brown has a diagnosis of insulin resistance based on her elevated fasting insulin level >5. She continues to work on diet and exercise to decrease her risk of diabetes. No polyphagia.  Lab Results  Component Value Date   INSULIN 5.4 12/07/2019   INSULIN 16.7 12/30/2018   Lab Results  Component Value Date   HGBA1C 5.4 12/07/2019   Vitamin D deficiency. Tracy Brown reports minimal sunlight exposure.   Ref. Range 03/07/2020 16:46  Vitamin D, 25-Hydroxy Latest Ref Range: 30.0 - 100.0 ng/mL 67.7   At risk for diabetes mellitus. Tracy Brown is at higher than average risk for developing diabetes due to insulin resistance.  Assessment/Plan:   Insulin resistance. Tracy Brown will continue to work on weight loss, exercise, and decreasing simple carbohydrates to help decrease the risk of diabetes. Tracy Brown agreed to follow-up with [name] as directed to closely monitor her progress. Prescription was given for metFORMIN (GLUCOPHAGE) 500 MG tablet 1 PO daily with breakfast #30 with 0 refills.  Vitamin D deficiency. Low Vitamin D level contributes to fatigue and are associated with obesity, breast, and colon cancer. She was given a prescription for Vitamin D, Ergocalciferol, (DRISDOL) 1.25 MG (50000 UNIT) CAPS capsule every 14 days #2 with 0  refills and will follow-up for routine testing of Vitamin D, at least 2-3 times per year to avoid over-replacement.   At risk for diabetes mellitus. Tracy Brown was given approximately 15 minutes of diabetes education and counseling today. We discussed intensive lifestyle modifications today with an emphasis on weight loss as well as increasing exercise and decreasing simple carbohydrates in her diet. We also reviewed medication options with an emphasis on risk versus benefit of those discussed.   Repetitive spaced learning was employed today to elicit superior memory formation and behavioral change.  Class 1 obesity with serious comorbidity and body mass index (BMI) of 30.0 to 30.9 in adult, unspecified obesity type - starting BMI >30.  Tracy Brown is currently in the action stage of change. As such, her goal is to continue with weight loss efforts. She has agreed to the Category 1 Plan.   She will work on meal planning, intentional eating, and increasing her water intake.   Exercise goals: Tracy Brown will continue walking 2 miles 5 times per week.  Behavioral modification strategies: increasing lean protein intake, decreasing simple carbohydrates, increasing vegetables, increasing water intake, decreasing eating out, no skipping meals, meal planning and cooking strategies, keeping healthy foods in the home and planning for success.  Tracy Brown has agreed to follow-up with our clinic in 4 weeks. She was informed of the importance of frequent follow-up visits to maximize her success with intensive lifestyle modifications for her multiple health conditions.   Objective:   Blood pressure 96/66, pulse 63, temperature 98.1 F (36.7 C), temperature source Oral, height  5\' 1"  (1.549 m), weight 136 lb (61.7 kg), SpO2 100 %. Body mass index is 25.7 kg/m.  General: Cooperative, alert, well developed, in no acute distress. HEENT: Conjunctivae and lids unremarkable. Cardiovascular: Regular rhythm.  Lungs: Normal work of  breathing. Neurologic: No focal deficits.   Lab Results  Component Value Date   CREATININE 0.69 03/07/2020   BUN 21 03/07/2020   NA 138 03/07/2020   K 4.2 03/07/2020   CL 102 03/07/2020   CO2 22 03/07/2020   Lab Results  Component Value Date   ALT 30 03/07/2020   AST 20 03/07/2020   ALKPHOS 71 03/07/2020   BILITOT 0.2 03/07/2020   Lab Results  Component Value Date   HGBA1C 5.4 12/07/2019   HGBA1C 5.4 12/30/2018   Lab Results  Component Value Date   INSULIN 5.4 12/07/2019   INSULIN 16.7 12/30/2018   Lab Results  Component Value Date   TSH 0.104 (L) 03/07/2020   Lab Results  Component Value Date   CHOL 134 03/07/2020   HDL 61 03/07/2020   LDLCALC 54 03/07/2020   TRIG 103 03/07/2020   CHOLHDL 2.5 02/18/2020   Lab Results  Component Value Date   WBC 6.8 04/07/2020   HGB 13.0 04/07/2020   HCT 39.0 04/07/2020   MCV 87.2 04/07/2020   PLT 232 04/07/2020   No results found for: IRON, TIBC, FERRITIN  Attestation Statements:   Reviewed by clinician on day of visit: allergies, medications, problem list, medical history, surgical history, family history, social history, and previous encounter notes.  Migdalia Tracy Brown, am acting as Location manager for [employer], DO   I have reviewed the above documentation for accuracy and completeness, and I agree with the above. Jearld Lesch, DO

## 2020-05-30 ENCOUNTER — Encounter (INDEPENDENT_AMBULATORY_CARE_PROVIDER_SITE_OTHER): Payer: Self-pay | Admitting: Bariatrics

## 2020-05-30 ENCOUNTER — Other Ambulatory Visit: Payer: Self-pay

## 2020-05-30 ENCOUNTER — Ambulatory Visit (INDEPENDENT_AMBULATORY_CARE_PROVIDER_SITE_OTHER): Payer: 59 | Admitting: Bariatrics

## 2020-05-30 VITALS — BP 105/75 | HR 58 | Temp 98.0°F | Ht 61.0 in | Wt 135.0 lb

## 2020-05-30 DIAGNOSIS — E7800 Pure hypercholesterolemia, unspecified: Secondary | ICD-10-CM

## 2020-05-30 DIAGNOSIS — Z9189 Other specified personal risk factors, not elsewhere classified: Secondary | ICD-10-CM

## 2020-05-30 DIAGNOSIS — R5383 Other fatigue: Secondary | ICD-10-CM | POA: Diagnosis not present

## 2020-05-30 DIAGNOSIS — E038 Other specified hypothyroidism: Secondary | ICD-10-CM | POA: Diagnosis not present

## 2020-05-30 DIAGNOSIS — R0602 Shortness of breath: Secondary | ICD-10-CM | POA: Diagnosis not present

## 2020-05-30 DIAGNOSIS — Z6830 Body mass index (BMI) 30.0-30.9, adult: Secondary | ICD-10-CM

## 2020-05-30 DIAGNOSIS — E559 Vitamin D deficiency, unspecified: Secondary | ICD-10-CM | POA: Diagnosis not present

## 2020-05-30 DIAGNOSIS — E7849 Other hyperlipidemia: Secondary | ICD-10-CM

## 2020-05-30 DIAGNOSIS — E8881 Metabolic syndrome: Secondary | ICD-10-CM

## 2020-05-30 DIAGNOSIS — E669 Obesity, unspecified: Secondary | ICD-10-CM

## 2020-05-30 MED ORDER — VITAMIN D (ERGOCALCIFEROL) 1.25 MG (50000 UNIT) PO CAPS: 50000.0000 [IU] | ORAL_CAPSULE | ORAL | 0 refills | Status: DC

## 2020-05-30 MED ORDER — METFORMIN HCL 500 MG PO TABS: 500.0000 mg | ORAL_TABLET | Freq: Every day | ORAL | 0 refills | Status: DC

## 2020-05-31 ENCOUNTER — Encounter (INDEPENDENT_AMBULATORY_CARE_PROVIDER_SITE_OTHER): Payer: Self-pay | Admitting: Bariatrics

## 2020-05-31 LAB — LIPID PANEL WITH LDL/HDL RATIO
Cholesterol, Total: 138 mg/dL (ref 100–199)
HDL: 64 mg/dL (ref >39)
LDL Chol Calc (NIH): 61 mg/dL (ref 0–99)
LDL/HDL Ratio: 1 ratio (ref 0.0–3.2)
Triglycerides: 60 mg/dL (ref 0–149)
VLDL Cholesterol Cal: 13 mg/dL (ref 5–40)

## 2020-05-31 LAB — TSH+T4F+T3FREE
Free T4: 1.44 ng/dL (ref 0.82–1.77)
T3, Free: 2.5 pg/mL (ref 2.0–4.4)
TSH: 0.572 u[IU]/mL (ref 0.450–4.500)

## 2020-05-31 LAB — VITAMIN D 25 HYDROXY (VIT D DEFICIENCY, FRACTURES): Vit D, 25-Hydroxy: 68.3 ng/mL (ref 30.0–100.0)

## 2020-05-31 LAB — HEMOGLOBIN A1C
Est. average glucose Bld gHb Est-mCnc: 111 mg/dL
Hgb A1c MFr Bld: 5.5 % (ref 4.8–5.6)

## 2020-05-31 LAB — INSULIN, RANDOM: INSULIN: 9 u[IU]/mL (ref 2.6–24.9)

## 2020-05-31 NOTE — Progress Notes (Signed)
Pt advised.

## 2020-05-31 NOTE — Progress Notes (Signed)
Chief Complaint:   OBESITY Tracy Brown is here to discuss her progress with her obesity treatment plan along with follow-up of her obesity related diagnoses. [name] is on the Category 1 Plan and states she is following her eating plan approximately 80% of the time. [name] states she is exercising 0 minutes 0 times per week.  Today's visit was #: 22 Starting weight: 168 lbs Starting date: 12/30/2018 Today's weight: 135 lbs Today's date: 05/30/2020 Total lbs lost to date: 33 Total lbs lost since last in-office visit: 1  Interim History: [name] is down 1 lb and has done exceptionally well. She has been on vacation.  Subjective:   Other fatigue and shortness of breath. [name] reports fatigue and shortness of breath with certain activities. IC was performed today showing RMR 1449; RMR was 1415 in October 2020.  Other specified hypothyroidism. [name] notes decreased fatigue.   Lab Results  Component Value Date   TSH 0.572 05/30/2020   Vitamin D deficiency. [name] is taking Vitamin D supplementation.    Ref. Range 03/07/2020 16:46  Vitamin D, 25-Hydroxy Latest Ref Range: 30.0 - 100.0 ng/mL 67.7   Insulin resistance. [name] has a diagnosis of insulin resistance based on her elevated fasting insulin level >5. She continues to work on diet and exercise to decrease her risk of diabetes. No polyphagia.  Lab Results  Component Value Date   INSULIN 9.0 05/30/2020   INSULIN 5.4 12/07/2019   INSULIN 16.7 12/30/2018   Lab Results  Component Value Date   HGBA1C 5.5 05/30/2020   Elevated cholesterol. [name] is taking a statin.  At risk for osteoporosis. [name] is at higher risk of osteopenia and osteoporosis due to Vitamin D deficiency, weight loss, and menopause.   Assessment/Plan:   Other fatigue and shortness of breath. [name]'s shortness of breath appears to be obesity related and exercise induced. She has agreed to work on weight loss and gradually increase exercise to treat  her exercise induced shortness of breath. Will continue to monitor closely. [name] was instructed to gradually increase her activities.  Other specified hypothyroidism. Patient with long-standing hypothyroidism, on levothyroxine therapy. She appears euthyroid. Orders and follow up as documented in patient record. TSH+T4F+T3Free labs will be checked today.   Counseling . Good thyroid control is important for overall health. Supratherapeutic thyroid levels are dangerous and will not improve weight loss results. . The correct way to take levothyroxine is fasting, with water, separated by at least 30 minutes from breakfast, and separated by more than 4 hours from calcium, iron, multivitamins, acid reflux medications (PPIs).    Vitamin D deficiency. Low Vitamin D level contributes to fatigue and are associated with obesity, breast, and colon cancer. She was given a prescription for Vitamin D, Ergocalciferol, (DRISDOL) 1.25 MG (50000 UNIT) CAPS capsule every 2 weeks #4 with 0 refills and VITAMIN D 25 Hydroxy (Vit-D Deficiency, Fractures) level will be checked today.   Insulin resistance. [name] will continue to work on weight loss, exercise, and decreasing simple carbohydrates to help decrease the risk of diabetes. [name] agreed to follow-up with [name] as directed to closely monitor her progress. Insulin, random, Hemoglobin A1c levels will be checked today. Prescription was given for metFORMIN (GLUCOPHAGE) 500 MG tablet 1 at breakfast #30 with 0 refills.  Elevated cholesterol. Lipid Panel With LDL/HDL Ratio will be checked today.  At risk for osteoporosis. [name] was given approximately 15 minutes of osteoporosis prevention counseling today. [name] is at risk for osteopenia and osteoporosis due  to her Vitamin D deficiency. She was encouraged to take her Vitamin D and follow her higher calcium diet and increase strengthening exercise to help strengthen her bones and decrease her risk of osteopenia and  osteoporosis.  Repetitive spaced learning was employed today to elicit superior memory formation and behavioral change.  Class 1 obesity with serious comorbidity and body mass index (BMI) of 30.0 to 30.9 in adult, unspecified obesity type - BMI greater than 30 @ start.  [name] is currently in the action stage of change. As such, her goal is to continue with weight loss efforts. She has agreed to the Category 1 Plan.   She will work on meal planning and intentional eating.   Exercise goals: All adults should avoid inactivity. Some physical activity is better than none, and adults who participate in any amount of physical activity gain some health benefits.  Behavioral modification strategies: increasing lean protein intake, decreasing simple carbohydrates, increasing vegetables, increasing water intake, decreasing eating out, no skipping meals, meal planning and cooking strategies, keeping healthy foods in the home and planning for success.  [name] has agreed to follow-up with our clinic in 4 weeks. She was informed of the importance of frequent follow-up visits to maximize her success with intensive lifestyle modifications for her multiple health conditions.   [name] was informed we would discuss her lab results at her next visit unless there is a critical issue that needs to be addressed sooner. [name] agreed to keep her next visit at the agreed upon time to discuss these results.  Objective:   Blood pressure 105/75, pulse (!) 58, temperature 98 F (36.7 C), height 5\' 1"  (1.549 m), weight 135 lb (61.2 kg), SpO2 98 %. Body mass index is 25.51 kg/m.  General: Cooperative, alert, well developed, in no acute distress. HEENT: Conjunctivae and lids unremarkable. Cardiovascular: Regular rhythm.  Lungs: Normal work of breathing. Neurologic: No focal deficits.   Lab Results  Component Value Date   CREATININE 0.69 03/07/2020   BUN 21 03/07/2020   NA 138 03/07/2020   K 4.2 03/07/2020   CL 102  03/07/2020   CO2 22 03/07/2020   Lab Results  Component Value Date   ALT 30 03/07/2020   AST 20 03/07/2020   ALKPHOS 71 03/07/2020   BILITOT 0.2 03/07/2020   Lab Results  Component Value Date   HGBA1C 5.4 12/07/2019   HGBA1C 5.4 12/30/2018   Lab Results  Component Value Date   INSULIN 5.4 12/07/2019   INSULIN 16.7 12/30/2018   Lab Results  Component Value Date   CHOLHDL 2.5 02/18/2020   Lab Results  Component Value Date   WBC 6.8 04/07/2020   HGB 13.0 04/07/2020   HCT 39.0 04/07/2020   MCV 87.2 04/07/2020   PLT 232 04/07/2020   No results found for: IRON, TIBC, FERRITIN   Attestation Statements:   Reviewed by clinician on day of visit: allergies, medications, problem list, medical history, surgical history, family history, social history, and previous encounter notes.  Migdalia Dk, am acting as Location manager for [employer], DO   I have reviewed the above documentation for accuracy and completeness, and I agree with the above. Jearld Lesch, DO

## 2020-06-19 ENCOUNTER — Encounter (INDEPENDENT_AMBULATORY_CARE_PROVIDER_SITE_OTHER): Payer: Self-pay

## 2020-06-26 ENCOUNTER — Other Ambulatory Visit: Payer: Self-pay

## 2020-06-26 ENCOUNTER — Ambulatory Visit (INDEPENDENT_AMBULATORY_CARE_PROVIDER_SITE_OTHER): Payer: 59 | Admitting: Bariatrics

## 2020-06-26 ENCOUNTER — Encounter (INDEPENDENT_AMBULATORY_CARE_PROVIDER_SITE_OTHER): Payer: Self-pay | Admitting: Bariatrics

## 2020-06-26 VITALS — BP 129/79 | HR 67 | Temp 98.1°F | Ht 61.0 in | Wt 138.0 lb

## 2020-06-26 DIAGNOSIS — Z9189 Other specified personal risk factors, not elsewhere classified: Secondary | ICD-10-CM

## 2020-06-26 DIAGNOSIS — E669 Obesity, unspecified: Secondary | ICD-10-CM

## 2020-06-26 DIAGNOSIS — E038 Other specified hypothyroidism: Secondary | ICD-10-CM

## 2020-06-26 DIAGNOSIS — E559 Vitamin D deficiency, unspecified: Secondary | ICD-10-CM

## 2020-06-26 DIAGNOSIS — Z6830 Body mass index (BMI) 30.0-30.9, adult: Secondary | ICD-10-CM | POA: Diagnosis not present

## 2020-06-26 MED ORDER — VITAMIN D (ERGOCALCIFEROL) 1.25 MG (50000 UNIT) PO CAPS: 50000.0000 [IU] | ORAL_CAPSULE | ORAL | 0 refills | Status: DC

## 2020-06-27 ENCOUNTER — Encounter (INDEPENDENT_AMBULATORY_CARE_PROVIDER_SITE_OTHER): Payer: Self-pay | Admitting: Bariatrics

## 2020-06-27 NOTE — Progress Notes (Signed)
Chief Complaint:   OBESITY Tracy Brown is here to discuss her progress with her obesity treatment plan along with follow-up of her obesity related diagnoses. Tracy Brown is on the Category 1 Plan and states she is following her eating plan approximately 80% of the time. Tracy Brown states she is exercising 0 minutes 0 times per week.  Today's visit was #: 23 Starting weight: 168 lbs Starting date: 12/30/2018 Today's weight: 138 lbs Today's date: 06/26/2020 Total lbs lost to date: 30 Total lbs lost since last in-office visit: 0  Interim History: Tracy Brown's bioimpedance readout shows that she is up 3.2 lbs of water, but her body fat is down. She reports drinking more water.  Subjective:   Vitamin D deficiency. Tracy Brown is taking Vitamin D supplementation.    Ref. Range 05/30/2020 09:11  Vitamin D, 25-Hydroxy Latest Ref Range: 30.0 - 100.0 ng/mL 68.3   Other specified hypothyroidism. Tracy Brown is taking Synthroid. Levels are controlled.   Lab Results  Component Value Date   TSH 0.572 05/30/2020   At risk for osteoporosis. Tracy Brown is at higher risk of osteopenia and osteoporosis due to Vitamin D deficiency.   Assessment/Plan:   Vitamin D deficiency. Low Vitamin D level contributes to fatigue and are associated with obesity, breast, and colon cancer. She agrees to continue to take Vitamin D, Ergocalciferol, (DRISDOL) 1.25 MG (50000 UNIT) CAPS capsule every 14 days #2 with 0 refills and will follow-up for routine testing of Vitamin D, at least 2-3 times per year to avoid over-replacement.  Other specified hypothyroidism. Patient with long-standing hypothyroidism, on levothyroxine therapy. She appears euthyroid. Orders and follow up as documented in patient record. Tracy Brown will continue Synthroid as directed.   Counseling . Good thyroid control is important for overall health. Supratherapeutic thyroid levels are dangerous and will not improve weight loss results. . The correct way to take  levothyroxine is fasting, with water, separated by at least 30 minutes from breakfast, and separated by more than 4 hours from calcium, iron, multivitamins, acid reflux medications (PPIs).   At risk for osteoporosis. Tracy Brown was given approximately 15 minutes of osteoporosis prevention counseling today. Tracy Brown is at risk for osteopenia and osteoporosis due to her Vitamin D deficiency. She was encouraged to take her Vitamin D and follow her higher calcium diet and increase strengthening exercise to help strengthen her bones and decrease her risk of osteopenia and osteoporosis.  Repetitive spaced learning was employed today to elicit superior memory formation and behavioral change.  Class 1 obesity with serious comorbidity and body mass index (BMI) of 30.0 to 30.9 in adult, unspecified obesity type - BMI greater than 30 at start of program.  Tracy Brown is currently in the action stage of change. As such, her goal is to continue with weight loss efforts. She has agreed to the Category 1 Plan.   She will work on meal planning and mindful eating.  Exercise goals: Tracy Brown has been sitting more and may consider going to the gym.  Behavioral modification strategies: increasing lean protein intake, decreasing simple carbohydrates, increasing vegetables, increasing water intake, decreasing eating out, no skipping meals, meal planning and cooking strategies, keeping healthy foods in the home and planning for success.  Tracy Brown has agreed to follow-up with our clinic in 2 weeks. She was informed of the importance of frequent follow-up visits to maximize her success with intensive lifestyle modifications for her multiple health conditions.   Objective:   Blood pressure 129/79, pulse 67, temperature 98.1 F (36.7 C),  height 5\' 1"  (1.549 m), weight 138 lb (62.6 kg), SpO2 98 %. Body mass index is 26.07 kg/m.  General: Cooperative, alert, well developed, in no acute distress. HEENT: Conjunctivae and lids  unremarkable. Cardiovascular: Regular rhythm.  Lungs: Normal work of breathing. Neurologic: No focal deficits.   Lab Results  Component Value Date   CREATININE 0.69 03/07/2020   BUN 21 03/07/2020   NA 138 03/07/2020   K 4.2 03/07/2020   CL 102 03/07/2020   CO2 22 03/07/2020   Lab Results  Component Value Date   ALT 30 03/07/2020   AST 20 03/07/2020   ALKPHOS 71 03/07/2020   BILITOT 0.2 03/07/2020   Lab Results  Component Value Date   HGBA1C 5.5 05/30/2020   HGBA1C 5.4 12/07/2019   HGBA1C 5.4 12/30/2018   Lab Results  Component Value Date   INSULIN 9.0 05/30/2020   INSULIN 5.4 12/07/2019   INSULIN 16.7 12/30/2018   Lab Results  Component Value Date   TSH 0.572 05/30/2020   Lab Results  Component Value Date   CHOL 138 05/30/2020   HDL 64 05/30/2020   LDLCALC 61 05/30/2020   TRIG 60 05/30/2020   CHOLHDL 2.5 02/18/2020   Lab Results  Component Value Date   WBC 6.8 04/07/2020   HGB 13.0 04/07/2020   HCT 39.0 04/07/2020   MCV 87.2 04/07/2020   PLT 232 04/07/2020   No results found for: IRON, TIBC, FERRITIN  Attestation Statements:   Reviewed by clinician on day of visit: allergies, medications, problem list, medical history, surgical history, family history, social history, and previous encounter notes.  Migdalia Dk, am acting as Location manager for [employer], DO   I have reviewed the above documentation for accuracy and completeness, and I agree with the above. Jearld Lesch, DO

## 2020-06-29 ENCOUNTER — Ambulatory Visit (INDEPENDENT_AMBULATORY_CARE_PROVIDER_SITE_OTHER): Payer: 59 | Admitting: Bariatrics

## 2020-07-03 ENCOUNTER — Other Ambulatory Visit (INDEPENDENT_AMBULATORY_CARE_PROVIDER_SITE_OTHER): Payer: Self-pay | Admitting: Bariatrics

## 2020-07-03 DIAGNOSIS — E8881 Metabolic syndrome: Secondary | ICD-10-CM

## 2020-07-03 NOTE — Telephone Encounter (Signed)
Refill request

## 2020-07-05 ENCOUNTER — Other Ambulatory Visit: Payer: Self-pay

## 2020-07-05 ENCOUNTER — Encounter: Payer: Self-pay | Admitting: Cardiovascular Disease

## 2020-07-05 ENCOUNTER — Ambulatory Visit (INDEPENDENT_AMBULATORY_CARE_PROVIDER_SITE_OTHER): Payer: 59 | Admitting: Cardiovascular Disease

## 2020-07-05 VITALS — BP 108/70 | HR 72 | Ht 61.0 in | Wt 143.0 lb

## 2020-07-05 DIAGNOSIS — E785 Hyperlipidemia, unspecified: Secondary | ICD-10-CM

## 2020-07-05 DIAGNOSIS — I2510 Atherosclerotic heart disease of native coronary artery without angina pectoris: Secondary | ICD-10-CM

## 2020-07-05 DIAGNOSIS — E782 Mixed hyperlipidemia: Secondary | ICD-10-CM

## 2020-07-05 NOTE — Assessment & Plan Note (Signed)
[name] had coronary calcification seen on chest CT and subsequent coronary calcium score of 113 performed 12/16/2019.  She is otherwise asymptomatic.

## 2020-07-05 NOTE — Patient Instructions (Signed)
Medication Instructions:  HOLD atorvastatin until visit with clinical pharmacy team  *If you need a refill on your cardiac medications before your next appointment, please call your pharmacy*   Follow-Up: At [facility], you and your health needs are our priority.  As part of our continuing mission to provide you with exceptional heart care, we have created designated Provider Care Teams.  These Care Teams include your primary Cardiologist (physician) and Advanced Practice Providers (APPs -  Physician Assistants and Nurse Practitioners) who all work together to provide you with the care you need, when you need it.  We recommend signing up for the patient portal called "MyChart".  Sign up information is provided on this After Visit Summary.  MyChart is used to connect with patients for Virtual Visits (Telemedicine).  Patients are able to view lab/test results, encounter notes, upcoming appointments, etc.  Non-urgent messages can be sent to your provider as well.   To learn more about what you can do with MyChart, go to [email or web address].    Your next appointment:   12 month(s)  The format for your next appointment:   In Person  Provider:   You may see Dr. Gwenlyn Found or one of the following Advanced Practice Providers on your designated Care Team:    Kerin Ransom, PA-C  [place], [place]  Coletta Memos, [place]    Other Instructions Dr. Gwenlyn Found would like you to see one of our clinical pharmacists in 1 month to discuss cholesterol

## 2020-07-05 NOTE — Assessment & Plan Note (Signed)
History of hyperlipidemia on atorvastatin 40 mg a day with lipid profile performed 05/30/2020 revealing total cholesterol 138, LDL of 61 and HDL of 64, significantly improved from her prior lipid profile performed 04/02/2019 at which time her LDL was 125.  This was result of statin therapy because of elevated coronary calcium score.  She is however complaining of some symptoms suggestive of statin intolerance.  I am going to give her a 3-day statin holiday and have her see a Pharm.D. back after that.  If her symptoms improved she can start on rosuvastatin 20 mg a day and if she is intolerant to that she may be a candidate for a PCSK9.

## 2020-07-05 NOTE — Progress Notes (Signed)
07/05/2020 Tracy Brown   19-Nov-1960  [id number]  Primary Physician Laurey Morale, MD Primary Cardiologist: Lorretta Harp MD Garret Reddish, [place], [place]  HPI:  Tracy Brown is a 59 y.o.  thin appearing divorced Caucasian female mother of 84, grandmother 4 grandchildren referred by Dr. Sharlene Motts for cardiovascular valuation because of calcification seen on chest CT performed 10/26/2019.  I last saw her in the office 12/08/2019. She currently works at home for [employer].  She stopped smoking 3 years ago and smoked 40 pack years.  She has mild untreated hyperlipidemia but no other cardiac risk factors.  She is never had a heart attack or stroke.  She denies chest pain or shortness of breath.  I ordered a coronary calcium score on 12/16/2019 which is 113.  Result I put her on atorvastatin which resulted in marked improvement of her lipid profile.  LDL went from 125 down to 61 however she is complaining of some arthralgias which may be related to statin intolerance.   Current Meds  Medication Sig  . Ascorbic Acid (VITAMIN C) 1000 MG tablet Take 1,000 mg by mouth daily.  Marland Kitchen aspirin 81 MG chewable tablet Chew 81 mg by mouth daily.  Marland Kitchen atorvastatin (LIPITOR) 40 MG tablet Take 40 mg every day after dinner (Patient taking differently: Take 40 mg by mouth daily. On hold per Gwenlyn Found MD as of 07/05/20)  . cetirizine (ZYRTEC) 10 MG tablet Take 10 mg by mouth daily.  Marland Kitchen ibuprofen (ADVIL,MOTRIN) 200 MG tablet Take 200 mg by mouth every 6 (six) hours as needed.  Marland Kitchen levothyroxine (SYNTHROID) 88 MCG tablet Take 1 tablet (88 mcg total) by mouth daily.  . metFORMIN (GLUCOPHAGE) 500 MG tablet TAKE 1 TABLET BY MOUTH EVERY DAY WITH breakfast  . MULTIPLE VITAMINS PO Take by mouth.  . Vitamin D, Ergocalciferol, (DRISDOL) 1.25 MG (50000 UNIT) CAPS capsule Take 1 capsule (50,000 Units total) by mouth every 14 (fourteen) days.  Marland Kitchen zinc gluconate 50 MG tablet Take 50 mg by mouth daily.     Allergies    Allergen Reactions  . Hydrocodone   . Phentermine Itching  . Zithromax [Azithromycin Dihydrate]     Social History   Socioeconomic History  . Marital status: Divorced    Spouse name: Not on file  . Number of children: 1  . Years of education: Not on file  . Highest education level: Not on file  Occupational History  . Not on file  Tobacco Use  . Smoking status: Former Smoker    Packs/day: 1.00    Years: 37.00    Pack years: 37.00    Quit date: 10/10/2016    Years since quitting: 3.7  . Smokeless tobacco: Never Used  Vaping Use  . Vaping Use: Never used  Substance and Sexual Activity  . Alcohol use: Yes    Alcohol/week: 0.0 standard drinks    Comment: once a month  . Drug use: No  . Sexual activity: Yes  Other Topics Concern  . Not on file  Social History Narrative  . Not on file   Social Determinants of Health   Financial Resource Strain:   . Difficulty of Paying Living Expenses: Not on file  Food Insecurity:   . Worried About Charity fundraiser in the Last Year: Not on file  . Ran Out of Food in the Last Year: Not on file  Transportation Needs:   . Lack of Transportation (Medical): Not on file  .  Lack of Transportation (Non-Medical): Not on file  Physical Activity:   . Days of Exercise per Week: Not on file  . Minutes of Exercise per Session: Not on file  Stress:   . Feeling of Stress : Not on file  Social Connections:   . Frequency of Communication with Friends and Family: Not on file  . Frequency of Social Gatherings with Friends and Family: Not on file  . Attends Religious Services: Not on file  . Active Member of Clubs or Organizations: Not on file  . Attends Archivist Meetings: Not on file  . Marital Status: Not on file  Intimate Partner Violence:   . Fear of Current or Ex-Partner: Not on file  . Emotionally Abused: Not on file  . Physically Abused: Not on file  . Sexually Abused: Not on file     Review of Systems: General: negative  for chills, fever, night sweats or weight changes.  Cardiovascular: negative for chest pain, dyspnea on exertion, edema, orthopnea, palpitations, paroxysmal nocturnal dyspnea or shortness of breath Dermatological: negative for rash Respiratory: negative for cough or wheezing Urologic: negative for hematuria Abdominal: negative for nausea, vomiting, diarrhea, bright red blood per rectum, melena, or hematemesis Neurologic: negative for visual changes, syncope, or dizziness All other systems reviewed and are otherwise negative except as noted above.    Blood pressure 108/70, pulse 72, height 5\' 1"  (1.549 m), weight 143 lb (64.9 kg).  General appearance: alert and no distress Neck: no adenopathy, no carotid bruit, no JVD, supple, symmetrical, trachea midline and thyroid not enlarged, symmetric, no tenderness/mass/nodules Lungs: clear to auscultation bilaterally Heart: regular rate and rhythm, S1, S2 normal, no murmur, click, rub or gallop Extremities: extremities normal, atraumatic, no cyanosis or edema Pulses: 2+ and symmetric Skin: Skin color, texture, turgor normal. No rashes or lesions Neurologic: Alert and oriented X 3, normal strength and tone. Normal symmetric reflexes. Normal coordination and gait  EKG sinus rhythm at 72 without ST or T wave changes.  I personally reviewed this EKG.  ASSESSMENT AND PLAN:   Coronary artery calcification seen on CT scan [name] had coronary calcification seen on chest CT and subsequent coronary calcium score of 113 performed 12/16/2019.  She is otherwise asymptomatic.  Hyperlipidemia History of hyperlipidemia on atorvastatin 40 mg a day with lipid profile performed 05/30/2020 revealing total cholesterol 138, LDL of 61 and HDL of 64, significantly improved from her prior lipid profile performed 04/02/2019 at which time her LDL was 125.  This was result of statin therapy because of elevated coronary calcium score.  She is however complaining of some  symptoms suggestive of statin intolerance.  I am going to give her a 3-day statin holiday and have her see a Pharm.D. back after that.  If her symptoms improved she can start on rosuvastatin 20 mg a day and if she is intolerant to that she may be a candidate for a PCSK9.      Lorretta Harp MD FACP,FACC,FAHA, [facility] 07/05/2020 2:28 PM

## 2020-07-30 ENCOUNTER — Encounter (INDEPENDENT_AMBULATORY_CARE_PROVIDER_SITE_OTHER): Payer: Self-pay | Admitting: Bariatrics

## 2020-07-31 ENCOUNTER — Ambulatory Visit (INDEPENDENT_AMBULATORY_CARE_PROVIDER_SITE_OTHER): Payer: 59 | Admitting: Bariatrics

## 2020-08-01 ENCOUNTER — Encounter (INDEPENDENT_AMBULATORY_CARE_PROVIDER_SITE_OTHER): Payer: Self-pay | Admitting: Bariatrics

## 2020-08-01 DIAGNOSIS — E559 Vitamin D deficiency, unspecified: Secondary | ICD-10-CM

## 2020-08-01 DIAGNOSIS — E8881 Metabolic syndrome: Secondary | ICD-10-CM

## 2020-08-01 MED ORDER — METFORMIN HCL 500 MG PO TABS: ORAL_TABLET | ORAL | 0 refills | Status: DC

## 2020-08-01 MED ORDER — VITAMIN D (ERGOCALCIFEROL) 1.25 MG (50000 UNIT) PO CAPS: 50000.0000 [IU] | ORAL_CAPSULE | ORAL | 0 refills | Status: DC

## 2020-08-08 ENCOUNTER — Ambulatory Visit: Payer: 59

## 2020-08-08 ENCOUNTER — Telehealth: Payer: Self-pay | Admitting: Pharmacist

## 2020-08-08 NOTE — Telephone Encounter (Signed)
Leg Cramps not resolved after stopping atorvastatin. Patient agreed to try rosuvastatin 20mg  daily as recommended by Dr .  Will follow up in 2 weeks to determine if able tolerate Rosuvastatin. Will consider PCSK9i if unable to tale rosuvastatin.

## 2020-08-09 ENCOUNTER — Other Ambulatory Visit: Payer: Self-pay | Admitting: Pharmacist Clinician (PhC)/ Clinical Pharmacy Specialist

## 2020-08-09 MED ORDER — ROSUVASTATIN CALCIUM 20 MG PO TABS: 20.0000 mg | ORAL_TABLET | Freq: Every day | ORAL | 3 refills | Status: DC

## 2020-08-12 ENCOUNTER — Encounter (INDEPENDENT_AMBULATORY_CARE_PROVIDER_SITE_OTHER): Payer: Self-pay | Admitting: Bariatrics

## 2020-08-15 ENCOUNTER — Ambulatory Visit (INDEPENDENT_AMBULATORY_CARE_PROVIDER_SITE_OTHER): Payer: 59 | Admitting: Bariatrics

## 2020-08-18 ENCOUNTER — Other Ambulatory Visit: Payer: 59

## 2020-08-25 ENCOUNTER — Encounter (INDEPENDENT_AMBULATORY_CARE_PROVIDER_SITE_OTHER): Payer: Self-pay | Admitting: Bariatrics

## 2020-08-29 ENCOUNTER — Ambulatory Visit (INDEPENDENT_AMBULATORY_CARE_PROVIDER_SITE_OTHER): Payer: 59 | Admitting: Bariatrics

## 2020-08-30 ENCOUNTER — Other Ambulatory Visit: Payer: Self-pay | Admitting: Family Medicine

## 2020-08-30 DIAGNOSIS — Z1231 Encounter for screening mammogram for malignant neoplasm of breast: Secondary | ICD-10-CM

## 2020-09-04 ENCOUNTER — Telehealth: Payer: Self-pay | Admitting: Pharmacist

## 2020-09-04 ENCOUNTER — Other Ambulatory Visit (INDEPENDENT_AMBULATORY_CARE_PROVIDER_SITE_OTHER): Payer: Self-pay | Admitting: Bariatrics

## 2020-09-04 DIAGNOSIS — E785 Hyperlipidemia, unspecified: Secondary | ICD-10-CM

## 2020-09-04 DIAGNOSIS — E559 Vitamin D deficiency, unspecified: Secondary | ICD-10-CM

## 2020-09-04 MED ORDER — VITAMIN D (ERGOCALCIFEROL) 1.25 MG (50000 UNIT) PO CAPS: 50000.0000 [IU] | ORAL_CAPSULE | ORAL | 0 refills | Status: DC

## 2020-09-04 NOTE — Telephone Encounter (Signed)
Refill request

## 2020-09-04 NOTE — Telephone Encounter (Signed)
Patient started rosuvastatin therapy only 2 days ago. Will cancel appointment with lipid clinic for tomorrow.  Plan to repeat fasting lipid panle in 6-8 weeks to determine if okay to continue current therapy or PCSK9i therapy needed

## 2020-09-05 ENCOUNTER — Other Ambulatory Visit (INDEPENDENT_AMBULATORY_CARE_PROVIDER_SITE_OTHER): Payer: Self-pay | Admitting: Family Medicine

## 2020-09-05 ENCOUNTER — Ambulatory Visit: Payer: 59

## 2020-09-05 DIAGNOSIS — E8881 Metabolic syndrome: Secondary | ICD-10-CM

## 2020-09-05 NOTE — Telephone Encounter (Signed)
Refill request

## 2020-09-19 ENCOUNTER — Other Ambulatory Visit: Payer: Self-pay

## 2020-09-19 ENCOUNTER — Ambulatory Visit
Admission: RE | Admit: 2020-09-19 | Discharge: 2020-09-19 | Disposition: A | Discharge status: Home / Self Care | Payer: 59 | Source: Ambulatory Visit | Attending: Family Medicine | Admitting: Family Medicine

## 2020-09-19 ENCOUNTER — Ambulatory Visit: Payer: 59

## 2020-09-19 DIAGNOSIS — Z1231 Encounter for screening mammogram for malignant neoplasm of breast: Secondary | ICD-10-CM

## 2020-09-21 ENCOUNTER — Ambulatory Visit (INDEPENDENT_AMBULATORY_CARE_PROVIDER_SITE_OTHER): Payer: 59 | Admitting: Bariatrics

## 2020-09-21 ENCOUNTER — Other Ambulatory Visit: Payer: Self-pay

## 2020-09-21 ENCOUNTER — Encounter (INDEPENDENT_AMBULATORY_CARE_PROVIDER_SITE_OTHER): Payer: Self-pay | Admitting: Bariatrics

## 2020-09-21 VITALS — BP 116/78 | HR 73 | Temp 98.0°F | Ht 61.0 in | Wt 141.0 lb

## 2020-09-21 DIAGNOSIS — E669 Obesity, unspecified: Secondary | ICD-10-CM

## 2020-09-21 DIAGNOSIS — E559 Vitamin D deficiency, unspecified: Secondary | ICD-10-CM | POA: Diagnosis not present

## 2020-09-21 DIAGNOSIS — Z9189 Other specified personal risk factors, not elsewhere classified: Secondary | ICD-10-CM | POA: Diagnosis not present

## 2020-09-21 DIAGNOSIS — Z6830 Body mass index (BMI) 30.0-30.9, adult: Secondary | ICD-10-CM | POA: Diagnosis not present

## 2020-09-21 DIAGNOSIS — E8881 Metabolic syndrome: Secondary | ICD-10-CM

## 2020-09-21 MED ORDER — METFORMIN HCL 500 MG PO TABS: ORAL_TABLET | ORAL | 0 refills | Status: DC

## 2020-09-21 MED ORDER — VITAMIN D (ERGOCALCIFEROL) 1.25 MG (50000 UNIT) PO CAPS: 50000.0000 [IU] | ORAL_CAPSULE | ORAL | 0 refills | Status: DC

## 2020-09-26 NOTE — Progress Notes (Signed)
Chief Complaint:   OBESITY Tracy Brown is here to discuss her progress with her obesity treatment plan along with follow-up of her obesity related diagnoses. Tracy Brown is on the Category 1 Plan and states she is following her eating plan approximately 70% of the time. Tracy Brown states she is doing 0 minutes 0 times per week.  Today's visit was #: 24 Starting weight: 168 lbs Starting date: 12/30/2018 Today's weight: 141 lbs Today's date: 09/21/2020 Total lbs lost to date: 27 Total lbs lost since last in-office visit: 0  Interim History: Tracy Brown is up 3 lbs but she has done well overall. Her water weight is up 1.5 lbs per our bioimpedance scale.  Subjective:   1. Insulin resistance Tracy Brown's insulin level is controlled with medications.  2. Vitamin D deficiency Tracy Brown is taking prescription Vit D. Last Vit D level was 68.3.  3. At risk for activity intolerance Tracy Brown is at risk for exercise intolerance due to weather.  Assessment/Plan:   1. Insulin resistance Tracy Brown will continue to work on weight loss, exercise, and decreasing simple carbohydrates to help decrease the risk of diabetes. We will refill metformin for 1 month. Charolett agreed to follow-up with [name] as directed to closely monitor her progress.  - metFORMIN (GLUCOPHAGE) 500 MG tablet; 1 daily in the am  Dispense: 30 tablet; Refill: 0  2. Vitamin D deficiency Low Vitamin D level contributes to fatigue and are associated with obesity, breast, and colon cancer. We will refill prescription Vitamin D for 1 month. [name] will follow-up for routine testing of Vitamin D, at least 2-3 times per year to avoid over-replacement.  - Vitamin D, Ergocalciferol, (DRISDOL) 1.25 MG (50000 UNIT) CAPS capsule; Take 1 capsule (50,000 Units total) by mouth every 14 (fourteen) days.  Dispense: 2 capsule; Refill: 0  3. At risk for activity intolerance Tracy Brown was given approximately 15 minutes of exercise intolerance counseling today. She is 60 y.o. female and has  risk factors exercise intolerance including obesity. We discussed intensive lifestyle modifications today with an emphasis on specific weight loss instructions and strategies. Tracy Brown will slowly increase activity as tolerated.  Repetitive spaced learning was employed today to elicit superior memory formation and behavioral change.  4. Class 1 obesity with serious comorbidity and body mass index (BMI) of 30.0 to 30.9 in adult, unspecified obesity type Tracy Brown is currently in the action stage of change. As such, her goal is to continue with weight loss efforts. She has agreed to following a lower carbohydrate, vegetable and lean protein rich diet plan.   Exercise goals: Increase activity.  Behavioral modification strategies: increasing lean protein intake, decreasing simple carbohydrates, increasing vegetables, increasing water intake, decreasing eating out, no skipping meals, meal planning and cooking strategies, keeping healthy foods in the home and planning for success.  Tracy Brown has agreed to follow-up with our clinic in 4 weeks. She was informed of the importance of frequent follow-up visits to maximize her success with intensive lifestyle modifications for her multiple health conditions.   Objective:   Blood pressure 116/78, pulse 73, temperature 98 F (36.7 C), height 5\' 1"  (1.549 m), weight 141 lb (64 kg), SpO2 97 %. Body mass index is 26.64 kg/m.  General: Cooperative, alert, well developed, in no acute distress. HEENT: Conjunctivae and lids unremarkable. Cardiovascular: Regular rhythm.  Lungs: Normal work of breathing. Neurologic: No focal deficits.   Lab Results  Component Value Date   CREATININE 0.69 03/07/2020   BUN 21 03/07/2020   NA 138 03/07/2020  K 4.2 03/07/2020   CL 102 03/07/2020   CO2 22 03/07/2020   Lab Results  Component Value Date   ALT 30 03/07/2020   AST 20 03/07/2020   ALKPHOS 71 03/07/2020   BILITOT 0.2 03/07/2020   Lab Results  Component Value Date    HGBA1C 5.5 05/30/2020   HGBA1C 5.4 12/07/2019   HGBA1C 5.4 12/30/2018   Lab Results  Component Value Date   INSULIN 9.0 05/30/2020   INSULIN 5.4 12/07/2019   INSULIN 16.7 12/30/2018   Lab Results  Component Value Date   TSH 0.572 05/30/2020   Lab Results  Component Value Date   CHOL 138 05/30/2020   HDL 64 05/30/2020   LDLCALC 61 05/30/2020   TRIG 60 05/30/2020   CHOLHDL 2.5 02/18/2020   Lab Results  Component Value Date   WBC 6.8 04/07/2020   HGB 13.0 04/07/2020   HCT 39.0 04/07/2020   MCV 87.2 04/07/2020   PLT 232 04/07/2020   No results found for: IRON, TIBC, FERRITIN  Attestation Statements:   Reviewed by clinician on day of visit: allergies, medications, problem list, medical history, surgical history, family history, social history, and previous encounter notes.   Wilhemena Durie, am acting as Location manager for [employer], DO.  I have reviewed the above documentation for accuracy and completeness, and I agree with the above. Jearld Lesch, DO

## 2020-09-28 ENCOUNTER — Encounter (INDEPENDENT_AMBULATORY_CARE_PROVIDER_SITE_OTHER): Payer: Self-pay | Admitting: Bariatrics

## 2020-10-16 ENCOUNTER — Ambulatory Visit (INDEPENDENT_AMBULATORY_CARE_PROVIDER_SITE_OTHER): Payer: 59 | Admitting: Bariatrics

## 2020-10-16 ENCOUNTER — Encounter (INDEPENDENT_AMBULATORY_CARE_PROVIDER_SITE_OTHER): Payer: Self-pay | Admitting: Bariatrics

## 2020-10-16 ENCOUNTER — Other Ambulatory Visit: Payer: Self-pay

## 2020-10-16 VITALS — BP 123/85 | HR 70 | Ht 61.0 in | Wt 143.0 lb

## 2020-10-16 DIAGNOSIS — Z6830 Body mass index (BMI) 30.0-30.9, adult: Secondary | ICD-10-CM | POA: Diagnosis not present

## 2020-10-16 DIAGNOSIS — E559 Vitamin D deficiency, unspecified: Secondary | ICD-10-CM

## 2020-10-16 DIAGNOSIS — Z9189 Other specified personal risk factors, not elsewhere classified: Secondary | ICD-10-CM | POA: Diagnosis not present

## 2020-10-16 DIAGNOSIS — E8881 Metabolic syndrome: Secondary | ICD-10-CM

## 2020-10-16 DIAGNOSIS — E6609 Other obesity due to excess calories: Secondary | ICD-10-CM

## 2020-10-16 MED ORDER — VITAMIN D (ERGOCALCIFEROL) 1.25 MG (50000 UNIT) PO CAPS
50000.0000 [IU] | ORAL_CAPSULE | ORAL | 0 refills | Status: DC
Start: 2020-10-16 — End: 2020-11-13

## 2020-10-17 NOTE — Progress Notes (Unsigned)
Chief Complaint:   OBESITY Tracy Brown is here to discuss her progress with her obesity treatment plan along with follow-up of her obesity related diagnoses. Tracy Brown is on following a lower carbohydrate, vegetable and lean protein rich diet plan and states she is following her eating plan approximately 80% of the time. [name] states she is walking for 45 minutes 2 times per week.  Today's visit was #: 25 Starting weight: 168 lbs Starting date: 12/30/2018 Today's weight: 143 lbs Today's date: 10/16/2020 Total lbs lost to date: 25 Total lbs lost since last in-office visit: 0  Interim History: Tracy Brown is up 2 lbs from her last visit and has done well overall. She is up 2 lbs of water per the bio-impedance scale.  Subjective:   1. Insulin resistance Zula is taking metformin, and she denies polyphagia.  2. Vitamin D deficiency Tracy Brown is taking Vit D, and her last Vit D level was 68.3.  3. At risk for osteoporosis Tracy Brown is at higher risk of osteopenia and osteoporosis due to Vitamin D deficiency.   Assessment/Plan:   1. Insulin resistance [name] will continue metformin, and will continue to work on weight loss, exercise, and decreasing simple carbohydrates to help decrease the risk of diabetes. Makynlie agreed to follow-up with [name] as directed to closely monitor her progress.  2. Vitamin D deficiency Low Vitamin D level contributes to fatigue and are associated with obesity, breast, and colon cancer. We will refill prescription Vitamin D for 1 month. [name] will follow-up for routine testing of Vitamin D, at least 2-3 times per year to avoid over-replacement.  - Vitamin D, Ergocalciferol, (DRISDOL) 1.25 MG (50000 UNIT) CAPS capsule; Take 1 capsule (50,000 Units total) by mouth every 14 (fourteen) days.  Dispense: 2 capsule; Refill: 0  3. At risk for osteoporosis Tracy Brown was given approximately 15 minutes of osteoporosis prevention counseling today. Tracy Brown is at risk for osteopenia and osteoporosis  due to her Vitamin D deficiency. She was encouraged to take her Vitamin D and follow her higher calcium diet and increase strengthening exercise to help strengthen her bones and decrease her risk of osteopenia and osteoporosis.  Repetitive spaced learning was employed today to elicit superior memory formation and behavioral change.  4. Class 1 obesity due to excess calories without serious comorbidity with body mass index (BMI) of 30.0 to 30.9 in adult Tracy Brown is currently in the action stage of change. As such, her goal is to continue with weight loss efforts. She has agreed to keeping a food journal and adhering to recommended goals of 1000 calories and 70-80 grams of protein daily.   Exercise goals: As is, and add weights.  Behavioral modification strategies: increasing lean protein intake, decreasing simple carbohydrates, increasing vegetables, increasing water intake, decreasing eating out, no skipping meals, meal planning and cooking strategies, keeping healthy foods in the home and planning for success.  Tracy Brown has agreed to follow-up with our clinic in 4 weeks. She was informed of the importance of frequent follow-up visits to maximize her success with intensive lifestyle modifications for her multiple health conditions.   Objective:   Blood pressure 123/85, pulse 70, height 5\' 1"  (1.549 m), weight 143 lb (64.9 kg), SpO2 95 %. Body mass index is 27.02 kg/m.  General: Cooperative, alert, well developed, in no acute distress. HEENT: Conjunctivae and lids unremarkable. Cardiovascular: Regular rhythm.  Lungs: Normal work of breathing. Neurologic: No focal deficits.   Lab Results  Component Value Date   CREATININE 0.69 03/07/2020  BUN 21 03/07/2020   NA 138 03/07/2020   K 4.2 03/07/2020   CL 102 03/07/2020   CO2 22 03/07/2020   Lab Results  Component Value Date   ALT 30 03/07/2020   AST 20 03/07/2020   ALKPHOS 71 03/07/2020   BILITOT 0.2 03/07/2020   Lab Results  Component  Value Date   HGBA1C 5.5 05/30/2020   HGBA1C 5.4 12/07/2019   HGBA1C 5.4 12/30/2018   Lab Results  Component Value Date   INSULIN 9.0 05/30/2020   INSULIN 5.4 12/07/2019   INSULIN 16.7 12/30/2018   Lab Results  Component Value Date   TSH 0.572 05/30/2020   Lab Results  Component Value Date   CHOL 138 05/30/2020   HDL 64 05/30/2020   LDLCALC 61 05/30/2020   TRIG 60 05/30/2020   CHOLHDL 2.5 02/18/2020   Lab Results  Component Value Date   WBC 6.8 04/07/2020   HGB 13.0 04/07/2020   HCT 39.0 04/07/2020   MCV 87.2 04/07/2020   PLT 232 04/07/2020   No results found for: IRON, TIBC, FERRITIN  Attestation Statements:   Reviewed by clinician on day of visit: allergies, medications, problem list, medical history, surgical history, family history, social history, and previous encounter notes.   Wilhemena Durie, am acting as Location manager for [employer], DO.  I have reviewed the above documentation for accuracy and completeness, and I agree with the above. Jearld Lesch, DO

## 2020-10-19 ENCOUNTER — Encounter (INDEPENDENT_AMBULATORY_CARE_PROVIDER_SITE_OTHER): Payer: Self-pay | Admitting: Bariatrics

## 2020-10-24 ENCOUNTER — Other Ambulatory Visit (INDEPENDENT_AMBULATORY_CARE_PROVIDER_SITE_OTHER): Payer: Self-pay | Admitting: Bariatrics

## 2020-10-24 DIAGNOSIS — E8881 Metabolic syndrome: Secondary | ICD-10-CM

## 2020-10-25 NOTE — Telephone Encounter (Signed)
Last seen by Dr. Brown. 

## 2020-10-25 NOTE — Telephone Encounter (Signed)
Pt has enough meds  

## 2020-11-13 ENCOUNTER — Ambulatory Visit (INDEPENDENT_AMBULATORY_CARE_PROVIDER_SITE_OTHER): Payer: 59 | Admitting: Bariatrics

## 2020-11-13 ENCOUNTER — Other Ambulatory Visit: Payer: Self-pay

## 2020-11-13 ENCOUNTER — Encounter (INDEPENDENT_AMBULATORY_CARE_PROVIDER_SITE_OTHER): Payer: Self-pay | Admitting: Bariatrics

## 2020-11-13 VITALS — BP 125/84 | HR 65 | Temp 98.0°F | Ht 61.0 in | Wt 145.0 lb

## 2020-11-13 DIAGNOSIS — E669 Obesity, unspecified: Secondary | ICD-10-CM

## 2020-11-13 DIAGNOSIS — E039 Hypothyroidism, unspecified: Secondary | ICD-10-CM

## 2020-11-13 DIAGNOSIS — Z9189 Other specified personal risk factors, not elsewhere classified: Secondary | ICD-10-CM | POA: Diagnosis not present

## 2020-11-13 DIAGNOSIS — E559 Vitamin D deficiency, unspecified: Secondary | ICD-10-CM | POA: Diagnosis not present

## 2020-11-13 DIAGNOSIS — E7849 Other hyperlipidemia: Secondary | ICD-10-CM | POA: Diagnosis not present

## 2020-11-13 DIAGNOSIS — E8881 Metabolic syndrome: Secondary | ICD-10-CM | POA: Diagnosis not present

## 2020-11-13 DIAGNOSIS — Z6831 Body mass index (BMI) 31.0-31.9, adult: Secondary | ICD-10-CM

## 2020-11-13 MED ORDER — VITAMIN D (ERGOCALCIFEROL) 1.25 MG (50000 UNIT) PO CAPS: 50000.0000 [IU] | ORAL_CAPSULE | ORAL | 0 refills | Status: DC

## 2020-11-13 MED ORDER — METFORMIN HCL 500 MG PO TABS: ORAL_TABLET | ORAL | 0 refills | Status: DC

## 2020-11-13 MED ORDER — PHENTERMINE-TOPIRAMATE ER 7.5-46 MG PO CP24: ORAL_CAPSULE | ORAL | 0 refills | Status: DC

## 2020-11-14 ENCOUNTER — Encounter (INDEPENDENT_AMBULATORY_CARE_PROVIDER_SITE_OTHER): Payer: Self-pay | Admitting: Bariatrics

## 2020-11-15 ENCOUNTER — Other Ambulatory Visit (INDEPENDENT_AMBULATORY_CARE_PROVIDER_SITE_OTHER): Payer: Self-pay | Admitting: Bariatrics

## 2020-11-15 ENCOUNTER — Encounter (INDEPENDENT_AMBULATORY_CARE_PROVIDER_SITE_OTHER): Payer: Self-pay | Admitting: Bariatrics

## 2020-11-15 DIAGNOSIS — E669 Obesity, unspecified: Secondary | ICD-10-CM

## 2020-11-15 DIAGNOSIS — Z6831 Body mass index (BMI) 31.0-31.9, adult: Secondary | ICD-10-CM

## 2020-11-15 MED ORDER — PHENTERMINE-TOPIRAMATE ER 7.5-46 MG PO CP24: ORAL_CAPSULE | ORAL | 0 refills | Status: DC

## 2020-11-15 NOTE — Progress Notes (Signed)
Chief Complaint:   OBESITY Tracy Brown is here to discuss her progress with her obesity treatment plan along with follow-up of her obesity related diagnoses. Tracy Brown is on the Category 1 Plan and states she is following her eating plan approximately 90% of the time. Tracy Brown states she is walking for 45 minutes 3-4 times per week.  Today's visit was #: 58 Starting weight: 168 lbs Starting date: 12/30/2018 Today's weight: 145 lbs Today's date: 11/13/2020 Total lbs lost to date: 23 lbs Total lbs lost since last in-office visit: 0  Interim History: Tracy Brown is up 2 pounds from her last visit.  She is following the plan closely. She is sticking closely to the plan.  She is getting in her protein and water.  Subjective:   1. Other hyperlipidemia Tracy Brown has hyperlipidemia and has been trying to improve her cholesterol levels with intensive lifestyle modification including a low saturated fat diet, exercise and weight loss. She denies any chest pain, claudication or myalgias.  She is taking Crestor.  Lab Results  Component Value Date   ALT 30 03/07/2020   AST 20 03/07/2020   ALKPHOS 71 03/07/2020   BILITOT 0.2 03/07/2020   Lab Results  Component Value Date   CHOL 138 05/30/2020   HDL 64 05/30/2020   LDLCALC 61 05/30/2020   TRIG 60 05/30/2020   CHOLHDL 2.5 02/18/2020   2. Vitamin D deficiency Tracy Brown's Vitamin D level was 68.3 on 05/30/2020. She is currently taking prescription vitamin D 50,000 IU each week. She denies nausea, vomiting or muscle weakness.  3. Insulin resistance Tracy Brown has a diagnosis of insulin resistance based on her elevated fasting insulin level >5. She continues to work on diet and exercise to decrease her risk of diabetes.  She is taking metformin.  Lab Results  Component Value Date   INSULIN 9.0 05/30/2020   INSULIN 5.4 12/07/2019   INSULIN 16.7 12/30/2018   Lab Results  Component Value Date   HGBA1C 5.5 05/30/2020   4. Acquired hypothyroidism Tracy Brown is taking  Synthroid 88 mcg daily.  Lab Results  Component Value Date   TSH 0.572 05/30/2020   5. At risk for heart disease Tracy Brown is at a higher than average risk for cardiovascular disease due to obesity.   Assessment/Plan:   1. Other hyperlipidemia Cardiovascular risk and specific lipid/LDL goals reviewed.  We discussed several lifestyle modifications today and Tracy Brown will continue to work on diet, exercise and weight loss efforts. Orders and follow up as documented in patient record.  Continue Crestor.  Will check lipid panel today.  Counseling Intensive lifestyle modifications are the first line treatment for this issue. . Dietary changes: Increase soluble fiber. Decrease simple carbohydrates. . Exercise changes: Moderate to vigorous-intensity aerobic activity 150 minutes per week if tolerated. . Lipid-lowering medications: see documented in medical record.  - Lipid Panel With LDL/HDL Ratio; Future  2. Vitamin D deficiency Low Vitamin D level contributes to fatigue and are associated with obesity, breast, and colon cancer. She agrees to continue to take prescription Vitamin D @50 ,000 IU every week and will follow-up for routine testing of Vitamin D, at least 2-3 times per year to avoid over-replacement.    - Refill Vitamin D, Ergocalciferol, (DRISDOL) 1.25 MG (50000 UNIT) CAPS capsule; Take 1 capsule (50,000 Units total) by mouth every 14 (fourteen) days.  Dispense: 2 capsule; Refill: 0  3. Insulin resistance Tracy Brown will continue to work on weight loss, exercise, and decreasing simple carbohydrates to help decrease the risk  of diabetes. Aimie agreed to follow-up with [name] as directed to closely monitor her progress.  Continue metformin.  Will refill today, as per below.  - Refill metFORMIN (GLUCOPHAGE) 500 MG tablet; 1 daily in the am  Dispense: 30 tablet; Refill: 0  4. Acquired hypothyroidism Patient with long-standing hypothyroidism, on levothyroxine therapy. She appears euthyroid. Orders and  follow up as documented in patient record.  Will check thyroid panel today.  Counseling . Good thyroid control is important for overall health. Supratherapeutic thyroid levels are dangerous and will not improve weight loss results. . The correct way to take levothyroxine is fasting, with water, separated by at least 30 minutes from breakfast, and separated by more than 4 hours from calcium, iron, multivitamins, acid reflux medications (PPIs).   - TSH+T4F+T3Free; Future  5. At risk for heart disease Tracy Brown was given approximately 15 minutes of coronary artery disease prevention counseling today. She is 60 y.o. female and has risk factors for heart disease including obesity. We discussed intensive lifestyle modifications today with an emphasis on specific weight loss instructions and strategies.   Repetitive spaced learning was employed today to elicit superior memory formation and behavioral change.  6. Obesity, current BMI 27  - Start Phentermine-Topiramate 7.5-46 MG CP24; Take 1 tablet daily in the am  Dispense: 30 capsule; Refill: 0  Tracy Brown is currently in the action stage of change. As such, her goal is to continue with weight loss efforts. She has agreed to the Category 1 Plan.   She will work in meal planning, will adhere closely to the plan, and will start Qsymia 7.5/46 mg daily.  Exercise goals: As is.  Behavioral modification strategies: increasing lean protein intake, decreasing simple carbohydrates, increasing vegetables, increasing water intake, decreasing eating out, no skipping meals, meal planning and cooking strategies, keeping healthy foods in the home and planning for success.  Tracy Brown has agreed to follow-up with our clinic in 2-3 weeks. She was informed of the importance of frequent follow-up visits to maximize her success with intensive lifestyle modifications for her multiple health conditions.   Tracy Brown was informed we would discuss her lab results at her next visit unless  there is a critical issue that needs to be addressed sooner. Tracy Brown agreed to keep her next visit at the agreed upon time to discuss these results.  Objective:   Blood pressure 125/84, pulse 65, temperature 98 F (36.7 C), height 5\' 1"  (1.549 m), weight 145 lb (65.8 kg), SpO2 98 %. Body mass index is 27.4 kg/m.  General: Cooperative, alert, well developed, in no acute distress. HEENT: Conjunctivae and lids unremarkable. Cardiovascular: Regular rhythm.  Lungs: Normal work of breathing. Neurologic: No focal deficits.   Lab Results  Component Value Date   CREATININE 0.69 03/07/2020   BUN 21 03/07/2020   NA 138 03/07/2020   K 4.2 03/07/2020   CL 102 03/07/2020   CO2 22 03/07/2020   Lab Results  Component Value Date   ALT 30 03/07/2020   AST 20 03/07/2020   ALKPHOS 71 03/07/2020   BILITOT 0.2 03/07/2020   Lab Results  Component Value Date   HGBA1C 5.5 05/30/2020   HGBA1C 5.4 12/07/2019   HGBA1C 5.4 12/30/2018   Lab Results  Component Value Date   INSULIN 9.0 05/30/2020   INSULIN 5.4 12/07/2019   INSULIN 16.7 12/30/2018   Lab Results  Component Value Date   TSH 0.572 05/30/2020   Lab Results  Component Value Date   CHOL 138 05/30/2020  HDL 64 05/30/2020   LDLCALC 61 05/30/2020   TRIG 60 05/30/2020   CHOLHDL 2.5 02/18/2020   Lab Results  Component Value Date   WBC 6.8 04/07/2020   HGB 13.0 04/07/2020   HCT 39.0 04/07/2020   MCV 87.2 04/07/2020   PLT 232 04/07/2020   Attestation Statements:   Reviewed by clinician on day of visit: allergies, medications, problem list, medical history, surgical history, family history, social history, and previous encounter notes.  I, Water quality scientist, CMA, am acting as Location manager for [employer], DO  I have reviewed the above documentation for accuracy and completeness, and I agree with the above. Jearld Lesch, DO

## 2020-11-16 LAB — COMPREHENSIVE METABOLIC PANEL
ALT: 27 IU/L (ref 0–32)
AST: 18 IU/L (ref 0–40)
Albumin/Globulin Ratio: 2.1 (ref 1.2–2.2)
Albumin: 4.6 g/dL (ref 3.8–4.9)
Alkaline Phosphatase: 66 IU/L (ref 44–121)
BUN/Creatinine Ratio: 29 — ABNORMAL HIGH (ref 9–23)
BUN: 18 mg/dL (ref 6–24)
Bilirubin Total: 0.3 mg/dL (ref 0.0–1.2)
CO2: 21 mmol/L (ref 20–29)
Calcium: 9.3 mg/dL (ref 8.7–10.2)
Chloride: 104 mmol/L (ref 96–106)
Creatinine, Ser: 0.63 mg/dL (ref 0.57–1.00)
Globulin, Total: 2.2 g/dL (ref 1.5–4.5)
Glucose: 86 mg/dL (ref 65–99)
Potassium: 4.2 mmol/L (ref 3.5–5.2)
Sodium: 141 mmol/L (ref 134–144)
Total Protein: 6.8 g/dL (ref 6.0–8.5)
eGFR: 102 mL/min/{1.73_m2} (ref >59)

## 2020-11-16 LAB — TSH: TSH: 0.798 u[IU]/mL (ref 0.450–4.500)

## 2020-11-16 LAB — T3: T3, Total: 74 ng/dL (ref 71–180)

## 2020-11-16 LAB — T4, FREE: Free T4: 1.54 ng/dL (ref 0.82–1.77)

## 2020-11-16 LAB — VITAMIN D 25 HYDROXY (VIT D DEFICIENCY, FRACTURES): Vit D, 25-Hydroxy: 56.8 ng/mL (ref 30.0–100.0)

## 2020-11-16 LAB — HEPATIC FUNCTION PANEL: Bilirubin, Direct: 0.11 mg/dL (ref 0.00–0.40)

## 2020-11-16 NOTE — Progress Notes (Signed)
Advised pt

## 2020-11-20 ENCOUNTER — Telehealth: Payer: Self-pay | Admitting: Acute Care

## 2020-11-20 ENCOUNTER — Encounter (INDEPENDENT_AMBULATORY_CARE_PROVIDER_SITE_OTHER): Payer: Self-pay | Admitting: Bariatrics

## 2020-11-20 NOTE — Telephone Encounter (Signed)
Pt last seen by Dr. Brown.  

## 2020-11-20 NOTE — Telephone Encounter (Signed)
I have spoke with Tracy Brown and she is aware that her LCS CT has been scheduled on 11/29/20 @ 4:00pm at [place]

## 2020-11-21 NOTE — Telephone Encounter (Signed)
Please review

## 2020-11-27 ENCOUNTER — Other Ambulatory Visit: Payer: Self-pay | Admitting: Cardiovascular Disease

## 2020-11-27 NOTE — Telephone Encounter (Signed)
Rx has been sent to the pharmacy electronically. ° °

## 2020-11-29 ENCOUNTER — Other Ambulatory Visit: Payer: Self-pay

## 2020-11-29 ENCOUNTER — Ambulatory Visit (INDEPENDENT_AMBULATORY_CARE_PROVIDER_SITE_OTHER)
Admission: RE | Admit: 2020-11-29 | Discharge: 2020-11-29 | Disposition: A | Discharge status: Home / Self Care | Payer: 59 | Source: Ambulatory Visit | Attending: Cardiology | Admitting: Cardiology

## 2020-11-29 DIAGNOSIS — Z87891 Personal history of nicotine dependence: Secondary | ICD-10-CM

## 2020-12-04 ENCOUNTER — Encounter (INDEPENDENT_AMBULATORY_CARE_PROVIDER_SITE_OTHER): Payer: Self-pay | Admitting: Bariatrics

## 2020-12-06 ENCOUNTER — Ambulatory Visit (INDEPENDENT_AMBULATORY_CARE_PROVIDER_SITE_OTHER): Payer: 59 | Admitting: Bariatrics

## 2020-12-11 ENCOUNTER — Encounter: Payer: Self-pay | Admitting: Family Medicine

## 2020-12-11 ENCOUNTER — Telehealth (INDEPENDENT_AMBULATORY_CARE_PROVIDER_SITE_OTHER): Payer: 59 | Admitting: Family Medicine

## 2020-12-11 ENCOUNTER — Other Ambulatory Visit (HOSPITAL_COMMUNITY): Payer: Self-pay

## 2020-12-11 VITALS — Wt 145.0 lb

## 2020-12-11 DIAGNOSIS — U071 COVID-19: Secondary | ICD-10-CM | POA: Diagnosis not present

## 2020-12-11 MED ORDER — MOLNUPIRAVIR EUA 200MG CAPSULE
4.0000 | ORAL_CAPSULE | Freq: Two times a day (BID) | ORAL | 0 refills | Status: AC
  Filled 2020-12-11: qty 40, 5d supply, fill #0

## 2020-12-11 NOTE — Progress Notes (Signed)
Please call patient and let them  know their  low dose Ct was read as a Lung RADS 2: nodules that are benign in appearance and behavior with a very low likelihood of becoming a clinically active cancer due to size or lack of growth. Recommendation per radiology is for a repeat LDCT in 12 months. .Please let them  know we will order and schedule their  annual screening scan for 11/2021 Please let them  know there was notation of CAD on their  scan.  Please remind the patient  that this is a non-gated exam therefore degree or severity of disease  cannot be determined. Please have them  follow up with their PCP regarding potential risk factor modification, dietary therapy or pharmacologic therapy if clinically indicated. Pt.  is  currently on statin therapy. Please place order for annual  screening scan for  10/2021 and fax results to PCP. Thanks so much.

## 2020-12-11 NOTE — Progress Notes (Signed)
Subjective:    Patient ID: Tracy Brown, female    DOB: [birth year], 60 y.o.   MRN: [id number]  HPI Virtual Visit via Video Note  I connected with the patient on 12/11/20 at  4:00 PM EDT by a video enabled telemedicine application and verified that I am speaking with the correct person using two identifiers.  Location patient: home Location provider:work or home office Persons participating in the virtual visit: patient, provider  I discussed the limitations of evaluation and management by telemedicine and the availability of in person appointments. The patient expressed understanding and agreed to proceed.   HPI: Here for a Covid-19 infection. Yesterday she began to have a bad headache, ST, low grade fever, and some diarrhea. This continued today. She tested positive for the Covid virus this morning with an at home test. No vomiting or SOB. She is drinking fluids and taking Tylenol and Mucinex.    ROS: See pertinent positives and negatives per HPI.  Past Medical History:  Diagnosis Date  . Allergy   . Anemia   . Chickenpox   . Colon polyps   . Diverticulosis   . Heart murmur   . History of recurrent UTIs   . Hyperlipidemia   . Hypertension   . Hypothyroid   . Nephrolithiasis   . OA (osteoarthritis)   . Serrated adenoma of colon 02/2017    Past Surgical History:  Procedure Laterality Date  . APPENDECTOMY    . BREAST CYST ASPIRATION Right   . COLON SURGERY  August 2012   right colectomy, per Dr. Judeth Horn, for tubular  adenoma   . COLONOSCOPY  02/28/2017   per Dr. Collene Mares, sessile serrated polyps, repeat in 5 years   . HEMORRHOID SURGERY    . HERNIA REPAIR    . 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 TONSILLECTOMY      Family History  Problem Relation Age of Onset  . Cancer Other        breast,colon,ovarian  . Coronary artery disease Other   . Hypertension Other   . Stroke Other   . Leukemia Father   . Cancer  Father   . Breast cancer Cousin   . Thyroid disease Mother   . Hypertension Mother   . Stroke Mother   . Breast cancer Maternal Grandmother      Current Outpatient Medications:  .  molnupiravir EUA 200 mg CAPS, Take 4 capsules (800 mg total) by mouth 2 (two) times daily for 5 days., Disp: 40 capsule, Rfl: 0 .  Ascorbic Acid (VITAMIN C) 1000 MG tablet, Take 1,000 mg by mouth daily., Disp: , Rfl:  .  aspirin 81 MG chewable tablet, Chew 81 mg by mouth daily., Disp: , Rfl:  .  cetirizine (ZYRTEC) 10 MG tablet, Take 10 mg by mouth daily., Disp: , Rfl:  .  ibuprofen (ADVIL,MOTRIN) 200 MG tablet, Take 200 mg by mouth every 6 (six) hours as needed., Disp: , Rfl:  .  levothyroxine (SYNTHROID) 88 MCG tablet, Take 1 tablet (88 mcg total) by mouth daily., Disp: 90 tablet, Rfl: 3 .  metFORMIN (GLUCOPHAGE) 500 MG tablet, 1 daily in the am, Disp: 30 tablet, Rfl: 0 .  MULTIPLE VITAMINS PO, Take by mouth., Disp: , Rfl:  .  Phentermine-Topiramate 7.5-46 MG CP24, Take 1 tablet daily in the am, Disp: 30 capsule, Rfl: 0 .  rosuvastatin (CRESTOR) 20 MG tablet, TAKE 1 TABLET BY MOUTH EVERY DAY,  Disp: 90 tablet, Rfl: 1 .  Vitamin D, Ergocalciferol, (DRISDOL) 1.25 MG (50000 UNIT) CAPS capsule, Take 1 capsule (50,000 Units total) by mouth every 14 (fourteen) days., Disp: 2 capsule, Rfl: 0 .  zinc gluconate 50 MG tablet, Take 50 mg by mouth daily., Disp: , Rfl:   EXAM:  VITALS per patient if applicable:  GENERAL: alert, oriented, appears well and in no acute distress  HEENT: atraumatic, conjunttiva clear, no obvious abnormalities on inspection of external nose and ears  NECK: normal movements of the head and neck  LUNGS: on inspection no signs of respiratory distress, breathing rate appears normal, no obvious gross SOB, gasping or wheezing  CV: no obvious cyanosis  MS: moves all visible extremities without noticeable abnormality  PSYCH/NEURO: pleasant and cooperative, no obvious depression or anxiety,  speech and thought processing grossly intact  ASSESSMENT AND PLAN: Covid-19 infection. We will treat her with 4 days of Molnupiravir. She knows to quarantine at home for 5 days. Recheck as needed.  Alysia Penna, MD  Discussed the following assessment and plan:  No diagnosis found.     I discussed the assessment and treatment plan with the patient. The patient was provided an opportunity to ask questions and all were answered. The patient agreed with the plan and demonstrated an understanding of the instructions.   The patient was advised to call back or seek an in-person evaluation if the symptoms worsen or if the condition fails to improve as anticipated.     Review of Systems     Objective:   Physical Exam        Assessment & Plan:

## 2020-12-14 ENCOUNTER — Other Ambulatory Visit: Payer: Self-pay | Admitting: *Deleted

## 2020-12-14 DIAGNOSIS — Z87891 Personal history of nicotine dependence: Secondary | ICD-10-CM

## 2020-12-25 ENCOUNTER — Encounter (INDEPENDENT_AMBULATORY_CARE_PROVIDER_SITE_OTHER): Payer: Self-pay | Admitting: Bariatrics

## 2020-12-25 NOTE — Telephone Encounter (Signed)
Please address patient's prescription request.  For some reason her next f/u didn't get scheduled.  When she calls the office we will go over that with her.  Thank you.

## 2020-12-26 ENCOUNTER — Other Ambulatory Visit (INDEPENDENT_AMBULATORY_CARE_PROVIDER_SITE_OTHER): Payer: Self-pay | Admitting: Bariatrics

## 2020-12-26 DIAGNOSIS — E8881 Metabolic syndrome: Secondary | ICD-10-CM

## 2020-12-26 DIAGNOSIS — E559 Vitamin D deficiency, unspecified: Secondary | ICD-10-CM

## 2020-12-26 MED ORDER — METFORMIN HCL 500 MG PO TABS: ORAL_TABLET | ORAL | 0 refills | Status: DC

## 2020-12-26 MED ORDER — VITAMIN D (ERGOCALCIFEROL) 1.25 MG (50000 UNIT) PO CAPS: 50000.0000 [IU] | ORAL_CAPSULE | ORAL | 0 refills | Status: DC

## 2020-12-26 NOTE — Telephone Encounter (Signed)
Refill request and she has an appt

## 2021-01-10 ENCOUNTER — Telehealth: Payer: Self-pay | Admitting: Cardiovascular Disease

## 2021-01-10 NOTE — Telephone Encounter (Signed)
Returned the call to the patient. She stated that she recently had labs completed at her PCP. She was needing labs drawn for PCP and wanted to know if they were the right ones completed. She stated that her rosuvastatin was refilled so she assumed that she did not need labs at this time.  She has been advised that she still needed the fasting Lipid completed.

## 2021-01-10 NOTE — Telephone Encounter (Signed)
Tracy Brown is calling requesting to speak with Raquel. She states it is in regards to following up about the labs she had about a month ago. Please advise.

## 2021-01-15 ENCOUNTER — Other Ambulatory Visit: Payer: Self-pay

## 2021-01-15 ENCOUNTER — Ambulatory Visit (INDEPENDENT_AMBULATORY_CARE_PROVIDER_SITE_OTHER): Payer: 59 | Admitting: Bariatrics

## 2021-01-15 ENCOUNTER — Encounter (INDEPENDENT_AMBULATORY_CARE_PROVIDER_SITE_OTHER): Payer: Self-pay | Admitting: Bariatrics

## 2021-01-15 VITALS — BP 125/81 | HR 59 | Temp 98.0°F | Ht 61.0 in | Wt 144.0 lb

## 2021-01-15 DIAGNOSIS — Z6831 Body mass index (BMI) 31.0-31.9, adult: Secondary | ICD-10-CM

## 2021-01-15 DIAGNOSIS — E669 Obesity, unspecified: Secondary | ICD-10-CM

## 2021-01-15 DIAGNOSIS — E559 Vitamin D deficiency, unspecified: Secondary | ICD-10-CM | POA: Diagnosis not present

## 2021-01-15 DIAGNOSIS — E8881 Metabolic syndrome: Secondary | ICD-10-CM | POA: Diagnosis not present

## 2021-01-15 DIAGNOSIS — Z9189 Other specified personal risk factors, not elsewhere classified: Secondary | ICD-10-CM

## 2021-01-15 MED ORDER — METFORMIN HCL 500 MG PO TABS: ORAL_TABLET | ORAL | 0 refills | Status: DC

## 2021-01-15 MED ORDER — VITAMIN D (ERGOCALCIFEROL) 1.25 MG (50000 UNIT) PO CAPS: 50000.0000 [IU] | ORAL_CAPSULE | ORAL | 0 refills | Status: DC

## 2021-01-23 ENCOUNTER — Encounter (INDEPENDENT_AMBULATORY_CARE_PROVIDER_SITE_OTHER): Payer: Self-pay | Admitting: Bariatrics

## 2021-01-23 NOTE — Progress Notes (Signed)
Chief Complaint:   OBESITY Tracy Brown is here to discuss her progress with her obesity treatment plan along with follow-up of her obesity related diagnoses. Tracy Brown is on the Category 1 Plan and states she is following her eating plan approximately 50% of the time. Tracy Brown states she is doing 0 minutes 0 times per week.  Today's visit was #: 74 Starting weight: 168 lbs Starting date: 12/30/2018 Today's weight: 144 lbs Today's date: 01/15/2021 Total lbs lost to date: 24 Total lbs lost since last in-office visit: 1  Interim History: Tracy Brown is down another 1 lb and she has done well overall. She is fairly adherent to the plan. She only took the Qsymia for 1 week and then she had COVID and will start back.  Subjective:   1. Insulin resistance Tracy Brown is taking metformin with no symptoms or side effects.  2. Vitamin D deficiency Tracy Brown is taking Vit D prescription, and she denies muscle weakness, nausea, or vomiting.  3. At risk for osteoporosis Tracy Brown is at higher risk of osteopenia and osteoporosis due to Vitamin D deficiency.   Assessment/Plan:   1. Insulin resistance Tracy Brown will continue to work on weight loss, exercise, and decreasing simple carbohydrates to help decrease the risk of diabetes. We will rel metformin for 1 month. Tracy Brown agreed to follow-up with [name] as directed to closely monitor her progress.  - metFORMIN (GLUCOPHAGE) 500 MG tablet; 1 daily in the am  Dispense: 30 tablet; Refill: 0  2. Vitamin D deficiency Low Vitamin D level contributes to fatigue and are associated with obesity, breast, and colon cancer. We will refill prescription Vitamin D for 1 month. Tracy Brown will follow-up for routine testing of Vitamin D, at least 2-3 times per year to avoid over-replacement.  - Vitamin D, Ergocalciferol, (DRISDOL) 1.25 MG (50000 UNIT) CAPS capsule; Take 1 capsule (50,000 Units total) by mouth every 14 (fourteen) days.  Dispense: 2 capsule; Refill: 0  3. At risk for osteoporosis Tracy Brown  was given approximately 15 minutes of osteoporosis prevention counseling today. Tracy Brown is at risk for osteopenia and osteoporosis due to her Vitamin D deficiency. She was encouraged to take her Vitamin D and follow her higher calcium diet and increase strengthening exercise to help strengthen her bones and decrease her risk of osteopenia and osteoporosis.  Repetitive spaced learning was employed today to elicit superior memory formation and behavioral change.  4. Obesity with current BMI of 27.3 Tracy Brown is currently in the action stage of change. As such, her goal is to continue with weight loss efforts. She has agreed to the Category 2 Plan.   Helayne will stay adherent to the meal plan.   Exercise goals: Minimal exercise, and will start back.  Behavioral modification strategies: increasing lean protein intake, decreasing simple carbohydrates, increasing vegetables, increasing water intake, decreasing eating out, no skipping meals, meal planning and cooking strategies, keeping healthy foods in the home, and planning for success.  Tracy Brown has agreed to follow-up with our clinic in 4 weeks. She was informed of the importance of frequent follow-up visits to maximize her success with intensive lifestyle modifications for her multiple health conditions.   Objective:   Blood pressure 125/81, pulse (!) 59, temperature 98 F (36.7 C), height 5\' 1"  (1.549 m), weight 144 lb (65.3 kg), SpO2 99 %. Body mass index is 27.21 kg/m.  General: Cooperative, alert, well developed, in no acute distress. HEENT: Conjunctivae and lids unremarkable. Cardiovascular: Regular rhythm.  Lungs: Normal work of breathing. Neurologic: No focal  deficits.   Lab Results  Component Value Date   CREATININE 0.63 11/15/2020   BUN 18 11/15/2020   NA 141 11/15/2020   K 4.2 11/15/2020   CL 104 11/15/2020   CO2 21 11/15/2020   Lab Results  Component Value Date   ALT 27 11/15/2020   AST 18 11/15/2020   ALKPHOS 66 11/15/2020    BILITOT 0.3 11/15/2020   Lab Results  Component Value Date   HGBA1C 5.5 05/30/2020   HGBA1C 5.4 12/07/2019   HGBA1C 5.4 12/30/2018   Lab Results  Component Value Date   INSULIN 9.0 05/30/2020   INSULIN 5.4 12/07/2019   INSULIN 16.7 12/30/2018   Lab Results  Component Value Date   TSH 0.798 11/15/2020   Lab Results  Component Value Date   CHOL 138 05/30/2020   HDL 64 05/30/2020   LDLCALC 61 05/30/2020   TRIG 60 05/30/2020   CHOLHDL 2.5 02/18/2020   Lab Results  Component Value Date   WBC 6.8 04/07/2020   HGB 13.0 04/07/2020   HCT 39.0 04/07/2020   MCV 87.2 04/07/2020   PLT 232 04/07/2020   No results found for: IRON, TIBC, FERRITIN  Attestation Statements:   Reviewed by clinician on day of visit: allergies, medications, problem list, medical history, surgical history, family history, social history, and previous encounter notes.   Wilhemena Durie, am acting as Location manager for [employer], DO.  I have reviewed the above documentation for accuracy and completeness, and I agree with the above. Jearld Lesch, DO

## 2021-02-08 ENCOUNTER — Encounter (INDEPENDENT_AMBULATORY_CARE_PROVIDER_SITE_OTHER): Payer: Self-pay | Admitting: Bariatrics

## 2021-02-08 ENCOUNTER — Other Ambulatory Visit (INDEPENDENT_AMBULATORY_CARE_PROVIDER_SITE_OTHER): Payer: Self-pay | Admitting: Bariatrics

## 2021-02-08 DIAGNOSIS — Z6831 Body mass index (BMI) 31.0-31.9, adult: Secondary | ICD-10-CM

## 2021-02-08 DIAGNOSIS — E559 Vitamin D deficiency, unspecified: Secondary | ICD-10-CM

## 2021-02-08 DIAGNOSIS — E669 Obesity, unspecified: Secondary | ICD-10-CM

## 2021-02-08 MED ORDER — PHENTERMINE-TOPIRAMATE ER 7.5-46 MG PO CP24: ORAL_CAPSULE | ORAL | 0 refills | Status: DC

## 2021-02-08 MED ORDER — VITAMIN D (ERGOCALCIFEROL) 1.25 MG (50000 UNIT) PO CAPS: 50000.0000 [IU] | ORAL_CAPSULE | ORAL | 0 refills | Status: DC

## 2021-02-08 NOTE — Telephone Encounter (Signed)
Last OV with Dr Brown 

## 2021-02-08 NOTE — Telephone Encounter (Signed)
Refill request, next OV 7/19

## 2021-02-13 ENCOUNTER — Ambulatory Visit (INDEPENDENT_AMBULATORY_CARE_PROVIDER_SITE_OTHER): Payer: 59 | Admitting: Bariatrics

## 2021-02-27 ENCOUNTER — Ambulatory Visit (INDEPENDENT_AMBULATORY_CARE_PROVIDER_SITE_OTHER): Payer: 59 | Admitting: Bariatrics

## 2021-03-10 ENCOUNTER — Other Ambulatory Visit: Payer: Self-pay | Admitting: Family Medicine

## 2021-03-15 ENCOUNTER — Ambulatory Visit (INDEPENDENT_AMBULATORY_CARE_PROVIDER_SITE_OTHER): Payer: 59 | Admitting: Bariatrics

## 2021-03-19 ENCOUNTER — Other Ambulatory Visit (INDEPENDENT_AMBULATORY_CARE_PROVIDER_SITE_OTHER): Payer: Self-pay | Admitting: Bariatrics

## 2021-03-19 ENCOUNTER — Encounter (INDEPENDENT_AMBULATORY_CARE_PROVIDER_SITE_OTHER): Payer: Self-pay | Admitting: Bariatrics

## 2021-03-19 DIAGNOSIS — E8881 Metabolic syndrome: Secondary | ICD-10-CM

## 2021-03-19 DIAGNOSIS — E88819 Insulin resistance, unspecified: Secondary | ICD-10-CM

## 2021-03-19 DIAGNOSIS — E559 Vitamin D deficiency, unspecified: Secondary | ICD-10-CM

## 2021-03-19 MED ORDER — VITAMIN D (ERGOCALCIFEROL) 1.25 MG (50000 UNIT) PO CAPS: 50000.0000 [IU] | ORAL_CAPSULE | ORAL | 0 refills | Status: DC

## 2021-03-19 MED ORDER — METFORMIN HCL 500 MG PO TABS: ORAL_TABLET | ORAL | 1 refills | Status: DC

## 2021-03-30 ENCOUNTER — Telehealth: Payer: Self-pay | Admitting: Family Medicine

## 2021-03-30 NOTE — Telephone Encounter (Signed)
Physician Results Form to be filled out--placed in Dr's folder.  Fax to [phone] upon completion.

## 2021-04-02 NOTE — Telephone Encounter (Signed)
Pt copy mailed out to her address

## 2021-04-02 NOTE — Telephone Encounter (Signed)
Pt Physician results form was faxed to the fax number provided by pt, pt was notified, copy sent to scan

## 2021-04-10 ENCOUNTER — Other Ambulatory Visit (INDEPENDENT_AMBULATORY_CARE_PROVIDER_SITE_OTHER): Payer: Self-pay | Admitting: Bariatrics

## 2021-04-10 DIAGNOSIS — E8881 Metabolic syndrome: Secondary | ICD-10-CM

## 2021-04-10 NOTE — Telephone Encounter (Signed)
Dr.Brown 

## 2021-04-23 ENCOUNTER — Ambulatory Visit (INDEPENDENT_AMBULATORY_CARE_PROVIDER_SITE_OTHER): Payer: 59 | Admitting: Bariatrics

## 2021-05-08 ENCOUNTER — Telehealth: Payer: Self-pay | Admitting: Family Medicine

## 2021-05-08 DIAGNOSIS — D509 Iron deficiency anemia, unspecified: Secondary | ICD-10-CM

## 2021-05-08 DIAGNOSIS — Z0000 Encounter for general adult medical examination without abnormal findings: Secondary | ICD-10-CM

## 2021-05-08 DIAGNOSIS — E038 Other specified hypothyroidism: Secondary | ICD-10-CM

## 2021-05-08 NOTE — Telephone Encounter (Signed)
PT has a physical on Sept 30th and want to see if she can come in earlier that day to get the bloodwork done as she has a apt after the physical to go to. Please advise.

## 2021-05-08 NOTE — Telephone Encounter (Signed)
Please advise 

## 2021-05-09 NOTE — Telephone Encounter (Signed)
Spoke with pt aware that her labs are placed, state that she will come the morning of her appointment,

## 2021-05-09 NOTE — Addendum Note (Signed)
Addended by: Alysia Penna A on: 05/09/2021 12:14 PM   Modules accepted: Orders

## 2021-05-09 NOTE — Telephone Encounter (Signed)
That's fine. I put in the lab orders

## 2021-05-10 ENCOUNTER — Other Ambulatory Visit (INDEPENDENT_AMBULATORY_CARE_PROVIDER_SITE_OTHER): Payer: Self-pay | Admitting: Bariatrics

## 2021-05-10 ENCOUNTER — Ambulatory Visit (INDEPENDENT_AMBULATORY_CARE_PROVIDER_SITE_OTHER): Payer: 59 | Admitting: Bariatrics

## 2021-05-10 DIAGNOSIS — E8881 Metabolic syndrome: Secondary | ICD-10-CM

## 2021-05-10 DIAGNOSIS — E559 Vitamin D deficiency, unspecified: Secondary | ICD-10-CM

## 2021-05-11 ENCOUNTER — Ambulatory Visit (INDEPENDENT_AMBULATORY_CARE_PROVIDER_SITE_OTHER): Payer: 59 | Admitting: Family Medicine

## 2021-05-11 ENCOUNTER — Other Ambulatory Visit: Payer: Self-pay

## 2021-05-11 ENCOUNTER — Encounter: Payer: Self-pay | Admitting: Family Medicine

## 2021-05-11 VITALS — BP 118/78 | HR 61 | Temp 98.2°F | Ht 61.0 in | Wt 155.5 lb

## 2021-05-11 DIAGNOSIS — D509 Iron deficiency anemia, unspecified: Secondary | ICD-10-CM

## 2021-05-11 DIAGNOSIS — E559 Vitamin D deficiency, unspecified: Secondary | ICD-10-CM | POA: Diagnosis not present

## 2021-05-11 DIAGNOSIS — E038 Other specified hypothyroidism: Secondary | ICD-10-CM | POA: Diagnosis not present

## 2021-05-11 DIAGNOSIS — Z0000 Encounter for general adult medical examination without abnormal findings: Secondary | ICD-10-CM | POA: Diagnosis not present

## 2021-05-11 DIAGNOSIS — E8881 Metabolic syndrome: Secondary | ICD-10-CM

## 2021-05-11 LAB — BASIC METABOLIC PANEL
BUN: 15 mg/dL (ref 6–23)
CO2: 28 mEq/L (ref 19–32)
Calcium: 9.6 mg/dL (ref 8.4–10.5)
Chloride: 105 mEq/L (ref 96–112)
Creatinine, Ser: 0.75 mg/dL (ref 0.40–1.20)
GFR: 86.92 mL/min (ref >60.00)
Glucose, Bld: 84 mg/dL (ref 70–99)
Potassium: 4.3 mEq/L (ref 3.5–5.1)
Sodium: 140 mEq/L (ref 135–145)

## 2021-05-11 LAB — HEPATIC FUNCTION PANEL
ALT: 22 U/L (ref 0–35)
AST: 17 U/L (ref 0–37)
Albumin: 4.6 g/dL (ref 3.5–5.2)
Alkaline Phosphatase: 61 U/L (ref 39–117)
Bilirubin, Direct: 0.1 mg/dL (ref 0.0–0.3)
Total Bilirubin: 0.3 mg/dL (ref 0.2–1.2)
Total Protein: 7.2 g/dL (ref 6.0–8.3)

## 2021-05-11 LAB — LIPID PANEL
Cholesterol: 118 mg/dL (ref 0–200)
HDL: 63.3 mg/dL (ref >39.00)
LDL Cholesterol: 45 mg/dL (ref 0–99)
NonHDL: 54.66
Total CHOL/HDL Ratio: 2
Triglycerides: 48 mg/dL (ref 0.0–149.0)
VLDL: 9.6 mg/dL (ref 0.0–40.0)

## 2021-05-11 LAB — IBC + FERRITIN
Ferritin: 88.3 ng/mL (ref 10.0–291.0)
Iron: 80 ug/dL (ref 42–145)
Saturation Ratios: 22.5 % (ref 20.0–50.0)
TIBC: 355.6 ug/dL (ref 250.0–450.0)
Transferrin: 254 mg/dL (ref 212.0–360.0)

## 2021-05-11 LAB — CBC WITH DIFFERENTIAL/PLATELET
Basophils Absolute: 0.1 10*3/uL (ref 0.0–0.1)
Basophils Relative: 0.9 % (ref 0.0–3.0)
Eosinophils Absolute: 0.2 10*3/uL (ref 0.0–0.7)
Eosinophils Relative: 3.9 % (ref 0.0–5.0)
HCT: 41.3 % (ref 36.0–46.0)
Hemoglobin: 13.4 g/dL (ref 12.0–15.0)
Lymphocytes Relative: 38 % (ref 12.0–46.0)
Lymphs Abs: 2.2 10*3/uL (ref 0.7–4.0)
MCHC: 32.4 g/dL (ref 30.0–36.0)
MCV: 85.2 fl (ref 78.0–100.0)
Monocytes Absolute: 0.4 10*3/uL (ref 0.1–1.0)
Monocytes Relative: 7.6 % (ref 3.0–12.0)
Neutro Abs: 2.9 10*3/uL (ref 1.4–7.7)
Neutrophils Relative %: 49.6 % (ref 43.0–77.0)
Platelets: 211 10*3/uL (ref 150.0–400.0)
RBC: 4.85 Mil/uL (ref 3.87–5.11)
RDW: 13.1 % (ref 11.5–15.5)
WBC: 5.8 10*3/uL (ref 4.0–10.5)

## 2021-05-11 LAB — HEMOGLOBIN A1C: Hgb A1c MFr Bld: 5.7 % (ref 4.6–6.5)

## 2021-05-11 LAB — T4, FREE: Free T4: 1.21 ng/dL (ref 0.60–1.60)

## 2021-05-11 LAB — T3, FREE: T3, Free: 3.1 pg/mL (ref 2.3–4.2)

## 2021-05-11 LAB — IRON: Iron: 80 ug/dL (ref 42–145)

## 2021-05-11 LAB — TSH: TSH: 0.53 u[IU]/mL (ref 0.35–5.50)

## 2021-05-11 MED ORDER — VITAMIN D (ERGOCALCIFEROL) 1.25 MG (50000 UNIT) PO CAPS: 50000.0000 [IU] | ORAL_CAPSULE | ORAL | 3 refills | Status: DC

## 2021-05-11 MED ORDER — METFORMIN HCL 500 MG PO TABS: ORAL_TABLET | ORAL | 3 refills | Status: DC

## 2021-05-11 NOTE — Progress Notes (Signed)
   Subjective:    Patient ID: Tracy Brown, female    DOB: 1961/03/17, 60 y.o.   MRN: [id number]  HPI Here for a well exam. She feels well.    Review of Systems  Constitutional: Negative.   HENT: Negative.    Eyes: Negative.   Respiratory: Negative.    Cardiovascular: Negative.   Gastrointestinal: Negative.   Genitourinary:  Negative for decreased urine volume, difficulty urinating, dyspareunia, dysuria, enuresis, flank pain, frequency, hematuria, pelvic pain and urgency.  Musculoskeletal: Negative.   Skin: Negative.   Neurological: Negative.  Negative for headaches.  Psychiatric/Behavioral: Negative.        Objective:   Physical Exam Constitutional:      General: She is not in acute distress.    Appearance: Normal appearance. She is well-developed.  HENT:     Head: Normocephalic and atraumatic.     Right Ear: External ear normal.     Left Ear: External ear normal.     Nose: Nose normal.     Mouth/Throat:     Pharynx: No oropharyngeal exudate.  Eyes:     General: No scleral icterus.    Conjunctiva/sclera: Conjunctivae normal.     Pupils: Pupils are equal, round, and reactive to light.  Neck:     Thyroid: No thyromegaly.     Vascular: No JVD.  Cardiovascular:     Rate and Rhythm: Normal rate and regular rhythm.     Heart sounds: Normal heart sounds. No murmur heard.   No friction rub. No gallop.  Pulmonary:     Effort: Pulmonary effort is normal. No respiratory distress.     Breath sounds: Normal breath sounds. No wheezing or rales.  Chest:     Chest wall: No tenderness.  Abdominal:     General: Bowel sounds are normal. There is no distension.     Palpations: Abdomen is soft. There is no mass.     Tenderness: There is no abdominal tenderness. There is no guarding or rebound.  Musculoskeletal:        General: No tenderness. Normal range of motion.     Cervical back: Normal range of motion and neck supple.  Lymphadenopathy:     Cervical: No cervical  adenopathy.  Skin:    General: Skin is warm and dry.     Findings: No erythema or rash.  Neurological:     Mental Status: She is alert and oriented to person, place, and time.     Cranial Nerves: No cranial nerve deficit.     Motor: No abnormal muscle tone.     Coordination: Coordination normal.     Deep Tendon Reflexes: Reflexes are normal and symmetric. Reflexes normal.  Psychiatric:        Behavior: Behavior normal.        Thought Content: Thought content normal.        Judgment: Judgment normal.          Assessment & Plan:  Well exam. We discussed diet and exercise. We await results of her lab work.  Alysia Penna, MD

## 2021-06-07 ENCOUNTER — Other Ambulatory Visit: Payer: Self-pay | Admitting: Cardiovascular Disease

## 2021-07-09 ENCOUNTER — Encounter (INDEPENDENT_AMBULATORY_CARE_PROVIDER_SITE_OTHER): Payer: Self-pay | Admitting: Bariatrics

## 2021-07-09 ENCOUNTER — Ambulatory Visit (INDEPENDENT_AMBULATORY_CARE_PROVIDER_SITE_OTHER): Payer: 59 | Admitting: Bariatrics

## 2021-07-09 ENCOUNTER — Other Ambulatory Visit: Payer: Self-pay

## 2021-07-09 VITALS — BP 120/70 | HR 71 | Temp 98.1°F | Ht 61.0 in | Wt 156.0 lb

## 2021-07-09 DIAGNOSIS — Z6831 Body mass index (BMI) 31.0-31.9, adult: Secondary | ICD-10-CM

## 2021-07-09 DIAGNOSIS — R7303 Prediabetes: Secondary | ICD-10-CM | POA: Diagnosis not present

## 2021-07-09 DIAGNOSIS — E669 Obesity, unspecified: Secondary | ICD-10-CM

## 2021-07-09 DIAGNOSIS — E038 Other specified hypothyroidism: Secondary | ICD-10-CM

## 2021-07-09 MED ORDER — SAXENDA 18 MG/3ML ~~LOC~~ SOPN: 0.6000 mg | PEN_INJECTOR | Freq: Every day | SUBCUTANEOUS | 0 refills | Status: DC

## 2021-07-10 ENCOUNTER — Encounter (INDEPENDENT_AMBULATORY_CARE_PROVIDER_SITE_OTHER): Payer: Self-pay

## 2021-07-10 ENCOUNTER — Encounter (INDEPENDENT_AMBULATORY_CARE_PROVIDER_SITE_OTHER): Payer: Self-pay | Admitting: Bariatrics

## 2021-07-10 ENCOUNTER — Telehealth (INDEPENDENT_AMBULATORY_CARE_PROVIDER_SITE_OTHER): Payer: Self-pay | Admitting: Bariatrics

## 2021-07-10 NOTE — Progress Notes (Signed)
Chief Complaint:   OBESITY Tracy Brown is here to discuss her progress with her obesity treatment plan along with follow-up of her obesity related diagnoses. Tracy Brown is on the Category 1 Plan and states she is following her eating plan approximately 60% of the time. Tracy Brown states she is doing 0 minutes 0 times per week.  Today's visit was #: 28 Starting weight: 168 lbs Starting date: 12/30/2018 Today's weight: 156 lbs Today's date: 07/09/2021 Total lbs lost to date: 12 lbs Total lbs lost since last in-office visit: 0  Interim History: Tracy Brown is up 12 lbs since her last visit on 11/08/2020. It has been hard to stay on the plan. She has stopped the Qysmia.   Subjective:   1. Prediabetes Tracy Brown is currently taking Metformin. She has stopped Qysmia. She states that she is not sure if it helped  with appetite.  2. Other specified hypothyroidism Tracy Brown is taking Synthroid currently.  Assessment/Plan:   1. Prediabetes Tracy Brown will stop the Metformin when she starts [name]. She will continue to work on weight loss, exercise, and decreasing simple carbohydrates to help decrease the risk of diabetes.    2. Other specified hypothyroidism Tracy Brown will continue taking Synthroid. Orders and follow up as documented in patient record.  Counseling Good thyroid control is important for overall health. Supratherapeutic thyroid levels are dangerous and will not improve weight loss results. Counseling: The correct way to take levothyroxine is fasting, with water, separated by at least 30 minutes from breakfast, and separated by more than 4 hours from calcium, iron, multivitamins, acid reflux medications (PPIs).    3. Obesity, current BMI 29.5 Tracy Brown is currently in the action stage of change. As such, her goal is to continue with weight loss efforts. She has agreed to the Category 1 Plan.   Tracy Brown will follow the plan more adherent 80-90%. She will stay on her plan. We will begin Saxenda 0.6 mg with no  refills.   - Liraglutide -Weight Management (SAXENDA) 18 MG/3ML SOPN; Inject 0.6 mg into the skin daily. And will taper as tolerated.  Dispense: 6 mL; Refill: 0  Exercise goals:  Tracy Brown is not able to walk as much.  Behavioral modification strategies: increasing lean protein intake, decreasing simple carbohydrates, increasing vegetables, increasing water intake, decreasing eating out, no skipping meals, meal planning and cooking strategies, keeping healthy foods in the home, and planning for success.  Tracy Brown has agreed to follow-up with our clinic in 3-4 weeks wit one of the NP's and 6 weeks with myself. She was informed of the importance of frequent follow-up visits to maximize her success with intensive lifestyle modifications for her multiple health conditions.   Objective:   Blood pressure 120/70, pulse 71, temperature 98.1 F (36.7 C), height 5\' 1"  (1.549 m), weight 156 lb (70.8 kg), SpO2 97 %. Body mass index is 29.48 kg/m.  General: Cooperative, alert, well developed, in no acute distress. HEENT: Conjunctivae and lids unremarkable. Cardiovascular: Regular rhythm.  Lungs: Normal work of breathing. Neurologic: No focal deficits.   Lab Results  Component Value Date   CREATININE 0.75 05/11/2021   BUN 15 05/11/2021   NA 140 05/11/2021   K 4.3 05/11/2021   CL 105 05/11/2021   CO2 28 05/11/2021   Lab Results  Component Value Date   ALT 22 05/11/2021   AST 17 05/11/2021   ALKPHOS 61 05/11/2021   BILITOT 0.3 05/11/2021   Lab Results  Component Value Date   HGBA1C 5.7 05/11/2021  HGBA1C 5.5 05/30/2020   HGBA1C 5.4 12/07/2019   HGBA1C 5.4 12/30/2018   Lab Results  Component Value Date   INSULIN 9.0 05/30/2020   INSULIN 5.4 12/07/2019   INSULIN 16.7 12/30/2018   Lab Results  Component Value Date   TSH 0.53 05/11/2021   Lab Results  Component Value Date   CHOL 118 05/11/2021   HDL 63.30 05/11/2021   LDLCALC 45 05/11/2021   TRIG 48.0 05/11/2021   CHOLHDL 2  05/11/2021   Lab Results  Component Value Date   VD25OH 56.8 11/15/2020   VD25OH 68.3 05/30/2020   VD25OH 67.7 03/07/2020   Lab Results  Component Value Date   WBC 5.8 05/11/2021   HGB 13.4 05/11/2021   HCT 41.3 05/11/2021   MCV 85.2 05/11/2021   PLT 211.0 05/11/2021   Lab Results  Component Value Date   IRON 80 05/11/2021   IRON 80 05/11/2021   TIBC 355.6 05/11/2021   FERRITIN 88.3 05/11/2021   Attestation Statements:   Reviewed by clinician on day of visit: allergies, medications, problem list, medical history, surgical history, family history, social history, and previous encounter notes.   I, Lizbeth Bark, RMA, am acting as Location manager for [employer], DO.   I have reviewed the above documentation for accuracy and completeness, and I agree with the above. Jearld Lesch, DO

## 2021-07-10 NOTE — Telephone Encounter (Signed)
Prior authorization denied for Saxenda. Patient sent mychart message.

## 2021-07-15 ENCOUNTER — Encounter (INDEPENDENT_AMBULATORY_CARE_PROVIDER_SITE_OTHER): Payer: Self-pay | Admitting: Bariatrics

## 2021-08-14 ENCOUNTER — Other Ambulatory Visit: Payer: Self-pay | Admitting: Family Medicine

## 2021-08-14 DIAGNOSIS — E8881 Metabolic syndrome: Secondary | ICD-10-CM

## 2021-08-14 NOTE — Telephone Encounter (Signed)
Spoke with pt state that her insurance denied covering Saxenda, states that she is still taking Metformin and needs refill sent to her pharmacy

## 2021-08-27 ENCOUNTER — Ambulatory Visit (INDEPENDENT_AMBULATORY_CARE_PROVIDER_SITE_OTHER): Payer: 59 | Admitting: Bariatrics

## 2021-09-03 ENCOUNTER — Other Ambulatory Visit: Payer: Self-pay | Admitting: Family Medicine

## 2021-10-01 ENCOUNTER — Ambulatory Visit (INDEPENDENT_AMBULATORY_CARE_PROVIDER_SITE_OTHER): Payer: 59 | Admitting: Bariatrics

## 2021-10-02 ENCOUNTER — Other Ambulatory Visit: Payer: Self-pay | Admitting: Family Medicine

## 2021-10-02 DIAGNOSIS — Z1231 Encounter for screening mammogram for malignant neoplasm of breast: Secondary | ICD-10-CM

## 2021-10-04 ENCOUNTER — Other Ambulatory Visit: Payer: Self-pay

## 2021-10-04 ENCOUNTER — Ambulatory Visit
Admission: RE | Admit: 2021-10-04 | Discharge: 2021-10-04 | Disposition: A | Discharge status: Home / Self Care | Payer: No Typology Code available for payment source | Source: Ambulatory Visit | Attending: Family Medicine | Admitting: Family Medicine

## 2021-10-04 DIAGNOSIS — Z1231 Encounter for screening mammogram for malignant neoplasm of breast: Secondary | ICD-10-CM

## 2021-11-06 ENCOUNTER — Other Ambulatory Visit: Payer: Self-pay | Admitting: Cardiovascular Disease

## 2021-11-16 ENCOUNTER — Encounter: Payer: Self-pay | Admitting: *Deleted

## 2021-11-29 ENCOUNTER — Ambulatory Visit (INDEPENDENT_AMBULATORY_CARE_PROVIDER_SITE_OTHER)
Admission: RE | Admit: 2021-11-29 | Discharge: 2021-11-29 | Disposition: A | Discharge status: Home / Self Care | Payer: No Typology Code available for payment source | Source: Ambulatory Visit | Attending: Acute Care | Admitting: Acute Care

## 2021-11-29 DIAGNOSIS — Z87891 Personal history of nicotine dependence: Secondary | ICD-10-CM

## 2021-12-03 ENCOUNTER — Encounter (INDEPENDENT_AMBULATORY_CARE_PROVIDER_SITE_OTHER): Payer: Self-pay | Admitting: Bariatrics

## 2021-12-03 ENCOUNTER — Ambulatory Visit (INDEPENDENT_AMBULATORY_CARE_PROVIDER_SITE_OTHER): Payer: No Typology Code available for payment source | Admitting: Bariatrics

## 2021-12-03 VITALS — BP 122/86 | HR 67 | Temp 97.9°F | Ht 61.0 in | Wt 164.0 lb

## 2021-12-03 DIAGNOSIS — R7303 Prediabetes: Secondary | ICD-10-CM | POA: Diagnosis not present

## 2021-12-03 DIAGNOSIS — E669 Obesity, unspecified: Secondary | ICD-10-CM | POA: Diagnosis not present

## 2021-12-03 DIAGNOSIS — E039 Hypothyroidism, unspecified: Secondary | ICD-10-CM

## 2021-12-03 DIAGNOSIS — Z6831 Body mass index (BMI) 31.0-31.9, adult: Secondary | ICD-10-CM | POA: Diagnosis not present

## 2021-12-03 MED ORDER — TIRZEPATIDE 2.5 MG/0.5ML ~~LOC~~ SOAJ: 2.5000 mg | SUBCUTANEOUS | 0 refills | Status: DC

## 2021-12-04 ENCOUNTER — Other Ambulatory Visit: Payer: Self-pay | Admitting: Cardiovascular Disease

## 2021-12-05 ENCOUNTER — Telehealth: Payer: Self-pay | Admitting: Family Medicine

## 2021-12-05 ENCOUNTER — Encounter (INDEPENDENT_AMBULATORY_CARE_PROVIDER_SITE_OTHER): Payer: Self-pay

## 2021-12-05 ENCOUNTER — Ambulatory Visit: Payer: No Typology Code available for payment source | Admitting: Family Medicine

## 2021-12-05 ENCOUNTER — Encounter: Payer: Self-pay | Admitting: Family Medicine

## 2021-12-05 ENCOUNTER — Ambulatory Visit (INDEPENDENT_AMBULATORY_CARE_PROVIDER_SITE_OTHER): Payer: No Typology Code available for payment source | Admitting: Family Medicine

## 2021-12-05 ENCOUNTER — Telehealth (INDEPENDENT_AMBULATORY_CARE_PROVIDER_SITE_OTHER): Payer: Self-pay | Admitting: Bariatrics

## 2021-12-05 VITALS — BP 120/78 | HR 66 | Temp 97.8°F | Wt 169.2 lb

## 2021-12-05 DIAGNOSIS — H1013 Acute atopic conjunctivitis, bilateral: Secondary | ICD-10-CM | POA: Diagnosis not present

## 2021-12-05 MED ORDER — AZELASTINE HCL 0.05 % OP SOLN: 1.0000 [drp] | Freq: Two times a day (BID) | OPHTHALMIC | 2 refills | Status: AC

## 2021-12-05 MED ORDER — CROMOLYN SODIUM 4 % OP SOLN: 2.0000 [drp] | Freq: Every day | OPHTHALMIC | 1 refills | Status: DC

## 2021-12-05 NOTE — Telephone Encounter (Signed)
Message has been sent to provider with request.  ?

## 2021-12-05 NOTE — Progress Notes (Signed)
? ?  Subjective:  ? ? Patient ID: SEELEY SOUTHGATE, female    DOB: Dec 16, 1960, 61 y.o.   MRN: [id number] ? ?HPI ?Here for itching and tearing in both eyes for 2 weeks. Her usual allergy symptoms have been acting up as well, including runny nose and sneezing. She does not wear contacts. She has been taking Zyrtec and using some OTC eye drops with no improvement.  ? ? ?Review of Systems  ?Constitutional: Negative.   ?HENT:  Positive for congestion and postnasal drip. Negative for sore throat.   ?Eyes:  Positive for itching. Negative for pain, discharge and redness.  ?Respiratory: Negative.    ? ?   ?Objective:  ? Physical Exam ?Constitutional:   ?   Appearance: Normal appearance. She is not ill-appearing.  ?HENT:  ?   Right Ear: Tympanic membrane, ear canal and external ear normal.  ?   Left Ear: Tympanic membrane, ear canal and external ear normal.  ?   Nose: Nose normal.  ?   Mouth/Throat:  ?   Pharynx: Oropharynx is clear.  ?Eyes:  ?   Conjunctiva/sclera: Conjunctivae normal.  ?   Pupils: Pupils are equal, round, and reactive to light.  ?Pulmonary:  ?   Effort: Pulmonary effort is normal.  ?   Breath sounds: Normal breath sounds.  ?Lymphadenopathy:  ?   Cervical: No cervical adenopathy.  ?Neurological:  ?   Mental Status: She is alert.  ? ? ? ? ? ?   ?Assessment & Plan:  ?Allergic conjunctivitis, treat with Cromolyn drops. Recheck prn.  ?Alysia Penna, MD ? ? ?

## 2021-12-05 NOTE — Telephone Encounter (Signed)
Dr.Brown 

## 2021-12-05 NOTE — Telephone Encounter (Signed)
Pt is calling checking on the status of medication 

## 2021-12-05 NOTE — Telephone Encounter (Signed)
Please advise 

## 2021-12-05 NOTE — Telephone Encounter (Signed)
I cancelled the Cromolyn and sent in Azelastine eye drops  ?

## 2021-12-05 NOTE — Telephone Encounter (Signed)
Lori from the pharmacy states cromolyn (OPTICROM) 4 % ophthalmic solution on backorder and is requesting a substitution ?

## 2021-12-05 NOTE — Telephone Encounter (Signed)
Prior authorization denied for [facility]. Per insurance: Patient does not have type 2 diabetes. Patient sent denial message via mychart.  ?

## 2021-12-06 ENCOUNTER — Encounter (INDEPENDENT_AMBULATORY_CARE_PROVIDER_SITE_OTHER): Payer: Self-pay | Admitting: Bariatrics

## 2021-12-06 NOTE — Telephone Encounter (Signed)
Spoke with Phineas Real at [facility] as Cecille Rubin was not available.  Latoya stated the new Rx was received and the patient picked this up last night. ?

## 2021-12-10 NOTE — Progress Notes (Signed)
? ? ? ?Chief Complaint:  ? ?OBESITY ?Tracy Brown is here to discuss her progress with her obesity treatment plan along with follow-up of her obesity related diagnoses. Tracy Brown is on the Category 1 Plan and states she is following her eating plan approximately 70% of the time. Tracy Brown states she is walking 40 minutes 4 times per week. ? ?Today's visit was #: 34 ?Starting weight: 168 lbs ?Starting date: 5/202/2022 ?Today's weight: 164 lb ?Today's date: 12/03/2021 ?Total lbs lost to date: 4 lbs ?Total lbs lost since last in-office visit: 0 lbs ? ?Interim History: Tracy Brown is up 8 lbs since her last visit on 07/09/2021. ? ?Subjective:  ? ?1. Prediabetes ?Tracy Brown is unable to take Metformin. She tried Qsymia but caused nausea and feeling jittery . Tracy Brown has a diagnosis of prediabetes based on her elevated HgA1c and was informed this puts her at greater risk of developing diabetes. She continues to work on diet and exercise to decrease her risk of diabetes. She denies nausea or hypoglycemia. ? ?2. Acquired hypothyroidism ?Tracy Brown is currently taking Synthroid. ? ?Assessment/Plan:  ? ?1. Prediabetes ?Good blood sugar control is important to decrease the likelihood of diabetic complications such as nephropathy, neuropathy, limb loss, blindness, coronary artery disease, and death. Intensive lifestyle modification including diet, exercise and weight loss are the first line of treatment for diabetes. Will prescribe Mounjaro 2.5 mg. ?- tirzepatide (MOUNJARO) 2.5 MG/0.5ML Pen; Inject 2.5 mg into the skin once a week. (Patient not taking: Reported on 12/05/2021)  Dispense: 2 mL; Refill: 0 ? ?2. Acquired hypothyroidism ? Patient with long-standing hypothyroidism, on levothyroxine therapy. She appears euthyroid. Orders and follow up as documented in patient record. Tracy Brown will continue taking synthroid. ? ?Counseling ?Good thyroid control is important for overall health. Supratherapeutic thyroid levels are dangerous and will not improve weight loss  results. ?The correct way to take levothyroxine is fasting, with water, separated by at least 30 minutes from breakfast, and separated by more than 4 hours from calcium, iron, multivitamins, acid reflux medications (PPIs).   ? ? ?Lab Results  ?Component Value Date  ? HGBA1C 5.7 05/11/2021  ? HGBA1C 5.5 05/30/2020  ? HGBA1C 5.4 12/07/2019  ? ?Lab Results  ?Component Value Date  ? LDLCALC 45 05/11/2021  ? CREATININE 0.75 05/11/2021  ? ?Lab Results  ?Component Value Date  ? VD25OH 56.8 11/15/2020  ? VD25OH 68.3 05/30/2020  ? VD25OH 67.7 03/07/2020  ? ? ?BP Readings from Last 3 Encounters:  ?12/05/21 120/78  ?12/03/21 122/86  ?07/09/21 120/70  ? ? ?3. Obesity, current BMI 31.1 ?Tracy Brown is currently in the action stage of change. As such, her goal is to continue with weight loss efforts. She has agreed to the Category 1 Plan.  ? ?Tracy Brown agreed to  meal planning and will follow the plan 80-90%. ? ?Exercise goals: As is. ? ?Behavioral modification strategies: increasing lean protein intake, decreasing simple carbohydrates, increasing vegetables, increasing water intake, decreasing eating out, no skipping meals, meal planning and cooking strategies, keeping healthy foods in the home, and planning for success. ? ?Tracy Brown has agreed to follow-up with our clinic in 4-5 weeks, fasting for IC & labs,. She was informed of the importance of frequent follow-up visits to maximize her success with intensive lifestyle modifications for her multiple health conditions.  ? ?Objective:  ? ?Blood pressure 122/86, pulse 67, temperature 97.9 ?F (36.6 ?C), height '5\' 1"'$  (1.549 m), SpO2 98 %. ?Body mass index is 29.48 kg/m?. ? ?General: Cooperative, alert, well developed, in no  acute distress. ?HEENT: Conjunctivae and lids unremarkable. ?Cardiovascular: Regular rhythm.  ?Lungs: Normal work of breathing. ?Neurologic: No focal deficits.  ? ?Lab Results  ?Component Value Date  ? CREATININE 0.75 05/11/2021  ? BUN 15 05/11/2021  ? NA 140 05/11/2021  ? K  4.3 05/11/2021  ? CL 105 05/11/2021  ? CO2 28 05/11/2021  ? ?Lab Results  ?Component Value Date  ? ALT 22 05/11/2021  ? AST 17 05/11/2021  ? ALKPHOS 61 05/11/2021  ? BILITOT 0.3 05/11/2021  ? ?Lab Results  ?Component Value Date  ? HGBA1C 5.7 05/11/2021  ? HGBA1C 5.5 05/30/2020  ? HGBA1C 5.4 12/07/2019  ? HGBA1C 5.4 12/30/2018  ? ?Lab Results  ?Component Value Date  ? INSULIN 9.0 05/30/2020  ? INSULIN 5.4 12/07/2019  ? INSULIN 16.7 12/30/2018  ? ?Lab Results  ?Component Value Date  ? TSH 0.53 05/11/2021  ? ?Lab Results  ?Component Value Date  ? CHOL 118 05/11/2021  ? HDL 63.30 05/11/2021  ? LDLCALC 45 05/11/2021  ? TRIG 48.0 05/11/2021  ? CHOLHDL 2 05/11/2021  ? ?Lab Results  ?Component Value Date  ? VD25OH 56.8 11/15/2020  ? VD25OH 68.3 05/30/2020  ? VD25OH 67.7 03/07/2020  ? ?Lab Results  ?Component Value Date  ? WBC 5.8 05/11/2021  ? HGB 13.4 05/11/2021  ? HCT 41.3 05/11/2021  ? MCV 85.2 05/11/2021  ? PLT 211.0 05/11/2021  ? ?Lab Results  ?Component Value Date  ? IRON 80 05/11/2021  ? IRON 80 05/11/2021  ? TIBC 355.6 05/11/2021  ? FERRITIN 88.3 05/11/2021  ? ? ?Attestation Statements:  ? ?Reviewed by clinician on day of visit: allergies, medications, problem list, medical history, surgical history, family history, social history, and previous encounter notes. ? ?I, Lennette Bihari, CMA, am acting as transcriptionist for Dr. Jearld Lesch, DO.  ? ?I have reviewed the above documentation for accuracy and completeness, and I agree with the above. Jearld Lesch, DO ? ?

## 2021-12-10 NOTE — Telephone Encounter (Signed)
Last OV with Dr Brown 

## 2021-12-11 ENCOUNTER — Encounter (INDEPENDENT_AMBULATORY_CARE_PROVIDER_SITE_OTHER): Payer: Self-pay | Admitting: Bariatrics

## 2021-12-27 ENCOUNTER — Other Ambulatory Visit: Payer: Self-pay | Admitting: Acute Care

## 2021-12-27 DIAGNOSIS — Z122 Encounter for screening for malignant neoplasm of respiratory organs: Secondary | ICD-10-CM

## 2021-12-27 DIAGNOSIS — Z87891 Personal history of nicotine dependence: Secondary | ICD-10-CM

## 2022-01-01 ENCOUNTER — Other Ambulatory Visit: Payer: Self-pay | Admitting: Cardiovascular Disease

## 2022-01-03 ENCOUNTER — Ambulatory Visit (INDEPENDENT_AMBULATORY_CARE_PROVIDER_SITE_OTHER): Payer: No Typology Code available for payment source | Admitting: Bariatrics

## 2022-01-03 ENCOUNTER — Telehealth: Payer: Self-pay | Admitting: Family Medicine

## 2022-01-03 NOTE — Telephone Encounter (Signed)
Pt called stating that Dr. [name] prescribed her with some eye drops that cleared up her itching however her eye is still watery. She was wondering if he can send a Rx to help with her watery right eye. You can give her a call at [phone] when the Rx order has been placed.   Please advise.

## 2022-01-04 NOTE — Telephone Encounter (Signed)
Reply message sent to patient via My Chart.

## 2022-01-04 NOTE — Telephone Encounter (Signed)
I often recommend Dr. Baldemar Lenis

## 2022-01-04 NOTE — Telephone Encounter (Signed)
I do not know of any drop that would work better than what she has, but a basic antihistamine pill would help like Zyrtec or Xyzal

## 2022-01-04 NOTE — Telephone Encounter (Signed)
Spoke with patient, informed of message.   Patient stated she currently takes Zyrtec, but will replace Zyrtec with Xyzal.      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     She's requested a recommendation to an eye surgeon that she could see in the future for this issue.   Please advise * Patient asked once message is received from Dr. [name] to send response via My Chart.

## 2022-01-28 ENCOUNTER — Other Ambulatory Visit: Payer: Self-pay | Admitting: Cardiovascular Disease

## 2022-02-11 ENCOUNTER — Other Ambulatory Visit: Payer: Self-pay | Admitting: Cardiovascular Disease

## 2022-02-18 ENCOUNTER — Telehealth: Payer: Self-pay | Admitting: Cardiovascular Disease

## 2022-02-18 NOTE — Telephone Encounter (Signed)
*  STAT* If patient is at the pharmacy, call can be transferred to refill team.   1. Which medications need to be refilled? (please list name of each medication and dose if known) rosuvastatin (CRESTOR) 20 MG tablet  2. Which pharmacy/location (including street and city if local pharmacy) is medication to be sent to? [facility] - [place], [place] - [address]  3. Do they need a 30 day or 90 day supply? 90  Patient has appt on 8/15

## 2022-02-21 HISTORY — PX: COLONOSCOPY: SHX174

## 2022-02-26 ENCOUNTER — Other Ambulatory Visit: Payer: Self-pay | Admitting: Cardiovascular Disease

## 2022-03-08 ENCOUNTER — Ambulatory Visit (INDEPENDENT_AMBULATORY_CARE_PROVIDER_SITE_OTHER): Payer: No Typology Code available for payment source | Admitting: Family Medicine

## 2022-03-08 ENCOUNTER — Encounter: Payer: Self-pay | Admitting: Family Medicine

## 2022-03-08 VITALS — BP 128/80 | HR 71 | Temp 98.0°F | Wt 169.0 lb

## 2022-03-08 DIAGNOSIS — J029 Acute pharyngitis, unspecified: Secondary | ICD-10-CM | POA: Diagnosis not present

## 2022-03-08 DIAGNOSIS — J020 Streptococcal pharyngitis: Secondary | ICD-10-CM | POA: Diagnosis not present

## 2022-03-08 DIAGNOSIS — R059 Cough, unspecified: Secondary | ICD-10-CM

## 2022-03-08 LAB — POCT RAPID STREP A (OFFICE): Rapid Strep A Screen: POSITIVE — AB

## 2022-03-08 LAB — POC COVID19 BINAXNOW: SARS Coronavirus 2 Ag: NEGATIVE

## 2022-03-08 MED ORDER — CEFUROXIME AXETIL 500 MG PO TABS: 500.0000 mg | ORAL_TABLET | Freq: Two times a day (BID) | ORAL | 0 refills | Status: AC

## 2022-03-08 NOTE — Progress Notes (Signed)
   Subjective:    Patient ID: Tracy Brown, female    DOB: Feb 25, 1961, 61 y.o.   MRN: [id number]  HPI Here for 5 days of PND, ST, hoarseness, and a dry cough. No fever or SOB. Drinking fluids and taking Mucinex.    Review of Systems  Constitutional: Negative.   HENT:  Positive for congestion, postnasal drip and sore throat. Negative for sinus pain.   Eyes: Negative.   Respiratory:  Positive for cough. Negative for shortness of breath and wheezing.        Objective:   Physical Exam Constitutional:      Appearance: Normal appearance. She is not ill-appearing.  HENT:     Right Ear: Tympanic membrane, ear canal and external ear normal.     Left Ear: Tympanic membrane, ear canal and external ear normal.     Nose: Nose normal.     Mouth/Throat:     Pharynx: Oropharynx is clear.  Eyes:     Conjunctiva/sclera: Conjunctivae normal.  Pulmonary:     Effort: Pulmonary effort is normal.     Breath sounds: Normal breath sounds.  Lymphadenopathy:     Cervical: No cervical adenopathy.  Neurological:     Mental Status: She is alert.           Assessment & Plan:  Strep pharyngitis, treat with 10 days of Cefuroxime. Alysia Penna, MD

## 2022-03-18 ENCOUNTER — Other Ambulatory Visit: Payer: Self-pay | Admitting: Family Medicine

## 2022-03-20 ENCOUNTER — Encounter (INDEPENDENT_AMBULATORY_CARE_PROVIDER_SITE_OTHER): Payer: Self-pay

## 2022-03-21 ENCOUNTER — Other Ambulatory Visit: Payer: Self-pay | Admitting: Cardiovascular Disease

## 2022-03-21 ENCOUNTER — Ambulatory Visit (INDEPENDENT_AMBULATORY_CARE_PROVIDER_SITE_OTHER): Payer: No Typology Code available for payment source | Admitting: Family Medicine

## 2022-03-21 ENCOUNTER — Encounter: Payer: Self-pay | Admitting: Family Medicine

## 2022-03-21 VITALS — BP 128/78 | HR 71 | Temp 98.4°F | Wt 169.0 lb

## 2022-03-21 DIAGNOSIS — H04201 Unspecified epiphora, right lacrimal gland: Secondary | ICD-10-CM

## 2022-03-21 DIAGNOSIS — L989 Disorder of the skin and subcutaneous tissue, unspecified: Secondary | ICD-10-CM | POA: Diagnosis not present

## 2022-03-21 DIAGNOSIS — J020 Streptococcal pharyngitis: Secondary | ICD-10-CM

## 2022-03-21 MED ORDER — NALOXONE HCL 4 MG/0.1ML NA LIQD: NASAL | 5 refills | Status: AC

## 2022-03-21 NOTE — Progress Notes (Signed)
   Subjective:    Patient ID: Tracy Brown, female    DOB: Nov 27, 1960, 61 y.o.   MRN: [id number]  HPI Here for several issues. First she is still having some symptoms for a recent strep pharyngitis. We saw her on 03-08-22 for this, and we treated her with 10 days of Cefuroxime. All her other symptoms resolved except she still has a slight irritation in her upper chest. No coughing or fever or SOB. She is taking Zyrtec and Flonase daily. A second issue is excessive tearing in the right eye. She was treated for this with a stent in her tear duct many years ago, and it went away for a long time. Now it is back. No eye pain or redness. Lastly she asks me to check a spot in the right lower leg that she noticed about 6 months ago. It has not changed in appearance.    Review of Systems  Constitutional: Negative.   HENT:  Negative for congestion, ear pain, postnasal drip, sinus pressure and sore throat.   Eyes:  Negative for photophobia, pain, redness and visual disturbance.  Respiratory: Negative.         Objective:   Physical Exam Constitutional:      Appearance: Normal appearance. She is not ill-appearing.  HENT:     Right Ear: Tympanic membrane, ear canal and external ear normal.     Left Ear: Tympanic membrane, ear canal and external ear normal.     Nose: Nose normal.     Mouth/Throat:     Pharynx: Oropharynx is clear.  Eyes:     Conjunctiva/sclera: Conjunctivae normal.     Pupils: Pupils are equal, round, and reactive to light.  Pulmonary:     Effort: Pulmonary effort is normal.     Breath sounds: Normal breath sounds.  Lymphadenopathy:     Cervical: No cervical adenopathy.  Skin:    Comments: The right lower leg has a macular light tan lesion about 4 mm in diameter   Neurological:     Mental Status: She is alert.           Assessment & Plan:  The chest irritation is likely to be some lingering irritation of her trachea. This should resolve on its own in the next week. We  will refer her to Ophthalmology for the excessive tearing in the right eye. She will consult her Dermatologist about the leg lesion. Alysia Penna, MD

## 2022-03-22 ENCOUNTER — Ambulatory Visit: Payer: No Typology Code available for payment source | Admitting: Family Medicine

## 2022-03-26 ENCOUNTER — Encounter: Payer: Self-pay | Admitting: Cardiovascular Disease

## 2022-03-26 ENCOUNTER — Ambulatory Visit (INDEPENDENT_AMBULATORY_CARE_PROVIDER_SITE_OTHER): Payer: No Typology Code available for payment source | Admitting: Cardiovascular Disease

## 2022-03-26 VITALS — BP 118/74 | HR 70 | Ht 62.0 in | Wt 171.8 lb

## 2022-03-26 DIAGNOSIS — E782 Mixed hyperlipidemia: Secondary | ICD-10-CM

## 2022-03-26 DIAGNOSIS — I2510 Atherosclerotic heart disease of native coronary artery without angina pectoris: Secondary | ICD-10-CM

## 2022-03-26 DIAGNOSIS — E785 Hyperlipidemia, unspecified: Secondary | ICD-10-CM | POA: Diagnosis not present

## 2022-03-26 NOTE — Assessment & Plan Note (Signed)
History of mildly elevated coronary calcium score of 113 measured on 12/16/2019.  She is totally asymptomatic.  As result of this I did place her on a statin drug which resulted in marked improvement in her lipid profile.

## 2022-03-26 NOTE — Progress Notes (Signed)
03/26/2022 Tracy Brown   04/21/1961  [id number]  Primary Physician Laurey Morale, MD Primary Cardiologist: Lorretta Harp MD Lupe Carney, [place]  HPI:  Tracy Brown is a 61 y.o.  thin appearing divorced Caucasian female mother of 32, grandmother 4 grandchildren referred by Dr. Sharlene Motts for cardiovascular valuation because of calcification seen on chest CT performed 10/26/2019.  I last saw her in the office 07/01/2020. She currently works at home for [employer].  She stopped smoking 3 years ago and smoked 40 pack years.  She has mild untreated hyperlipidemia but no other cardiac risk factors.  She is never had a heart attack or stroke.  She denies chest pain or shortness of breath.   I ordered a coronary calcium score on 12/16/2019 which is 113.  Result I put her on atorvastatin which resulted in marked improvement of her lipid profile.  LDL went from 125 down to 61 however she was complaining of some arthralgias and as a result she was switched to rosuvastatin which she is tolerating.  Since I saw her a year and a half ago she continues to do well.  She is completely asymptomatic still on low-dose rosuvastatin with an excellent lipid profile.  She specifically denies chest pain or shortness of breath.  Current Meds  Medication Sig   Ascorbic Acid (VITAMIN C) 1000 MG tablet Take 1,000 mg by mouth daily.   aspirin 81 MG chewable tablet Chew 81 mg by mouth daily.   azelastine (OPTIVAR) 0.05 % ophthalmic solution Place 1 drop into both eyes 2 (two) times daily.   cetirizine (ZYRTEC) 10 MG tablet Take 10 mg by mouth daily.   ibuprofen (ADVIL,MOTRIN) 200 MG tablet Take 200 mg by mouth every 6 (six) hours as needed.   levothyroxine (SYNTHROID) 88 MCG tablet TAKE 1 TABLET BY MOUTH EVERY DAY   MULTIPLE VITAMINS PO Take by mouth.   naloxone (NARCAN) nasal spray 4 mg/0.1 mL As needed   rosuvastatin (CRESTOR) 20 MG tablet Take 1 tablet (20 mg total) by mouth daily. KEEP UPCOMING  APPOINTMENT FOR FUTURE REFILLS.   Vitamin D, Ergocalciferol, (DRISDOL) 1.25 MG (50000 UNIT) CAPS capsule Take 1 capsule (50,000 Units total) by mouth every 14 (fourteen) days.   zinc gluconate 50 MG tablet Take 50 mg by mouth daily.     Allergies  Allergen Reactions   Hydrocodone    Phentermine Itching   Zithromax [Azithromycin Dihydrate]     Social History   Socioeconomic History   Marital status: Divorced    Spouse name: Not on file   Number of children: 1   Years of education: Not on file   Highest education level: Not on file  Occupational History   Not on file  Tobacco Use   Smoking status: Former    Packs/day: 1.00    Years: 37.00    Total pack years: 37.00    Types: Cigarettes    Quit date: 10/10/2016    Years since quitting: 5.4   Smokeless tobacco: Never  Vaping Use   Vaping Use: Never used  Substance and Sexual Activity   Alcohol use: Yes    Alcohol/week: 0.0 standard drinks of alcohol    Comment: once a month   Drug use: No   Sexual activity: Yes  Other Topics Concern   Not on file  Social History Narrative   Not on file   Social Determinants of Health   Financial Resource Strain: Not on file  Food Insecurity: Not on file  Transportation Needs: Not on file  Physical Activity: Not on file  Stress: Not on file  Social Connections: Not on file  Intimate Partner Violence: Not on file     Review of Systems: General: negative for chills, fever, night sweats or weight changes.  Cardiovascular: negative for chest pain, dyspnea on exertion, edema, orthopnea, palpitations, paroxysmal nocturnal dyspnea or shortness of breath Dermatological: negative for rash Respiratory: negative for cough or wheezing Urologic: negative for hematuria Abdominal: negative for nausea, vomiting, diarrhea, bright red blood per rectum, melena, or hematemesis Neurologic: negative for visual changes, syncope, or dizziness All other systems reviewed and are otherwise negative  except as noted above.    Blood pressure 118/74, pulse 70, height '5\' 2"'$  (1.575 m), weight 171 lb 12.8 oz (77.9 kg), SpO2 99 %.  General appearance: alert and no distress Neck: no adenopathy, no carotid bruit, no JVD, supple, symmetrical, trachea midline, and thyroid not enlarged, symmetric, no tenderness/mass/nodules Lungs: clear to auscultation bilaterally Heart: regular rate and rhythm, S1, S2 normal, no murmur, click, rub or gallop Extremities: extremities normal, atraumatic, no cyanosis or edema Pulses: 2+ and symmetric Skin: Skin color, texture, turgor normal. No rashes or lesions Neurologic: Grossly normal  EKG sinus rhythm at 70 without ST or T wave changes.  Personally reviewed this EKG.  ASSESSMENT AND PLAN:   HYPERTENSION History of essential hypertension a blood pressure measured today at 118/74.  She currently is not on antihypertensive medications.  Hyperlipidemia History of hyperlipidemia currently on rosuvastatin with lipid profile performed 05/11/2021 revealing total cholesterol 118, LDL 45 and HDL of 63.  Coronary artery calcification seen on CT scan History of mildly elevated coronary calcium score of 113 measured on 12/16/2019.  She is totally asymptomatic.  As result of this I did place her on a statin drug which resulted in marked improvement in her lipid profile.     Lorretta Harp MD FACP,FACC,FAHA, [facility] 03/26/2022 4:16 PM

## 2022-03-26 NOTE — Assessment & Plan Note (Signed)
History of hyperlipidemia currently on rosuvastatin with lipid profile performed 05/11/2021 revealing total cholesterol 118, LDL 45 and HDL of 63.

## 2022-03-26 NOTE — Assessment & Plan Note (Signed)
History of essential hypertension a blood pressure measured today at 118/74.  She currently is not on antihypertensive medications.

## 2022-03-26 NOTE — Patient Instructions (Signed)

## 2022-04-08 ENCOUNTER — Telehealth: Payer: Self-pay | Admitting: Family Medicine

## 2022-04-08 NOTE — Telephone Encounter (Signed)
Pt called to say [facility] cannot see her until November.  Pt would like to know if MD could possibly give her another referral -   Preferably to [facility]?  Please advise.

## 2022-04-08 NOTE — Telephone Encounter (Signed)
Contacted Herbert Deaner and they are currently closed. I will call tomorrow to check and see.

## 2022-04-08 NOTE — Telephone Encounter (Signed)
Will send to referral coordinator for review.

## 2022-04-17 ENCOUNTER — Encounter: Payer: Self-pay | Admitting: Family Medicine

## 2022-04-17 ENCOUNTER — Other Ambulatory Visit: Payer: Self-pay | Admitting: Cardiovascular Disease

## 2022-04-17 DIAGNOSIS — H04203 Unspecified epiphora, bilateral lacrimal glands: Secondary | ICD-10-CM

## 2022-04-18 NOTE — Telephone Encounter (Signed)
I did a new referral to Dr. Herbert Deaner

## 2022-05-01 ENCOUNTER — Ambulatory Visit (INDEPENDENT_AMBULATORY_CARE_PROVIDER_SITE_OTHER): Payer: No Typology Code available for payment source | Admitting: Family Medicine

## 2022-05-01 ENCOUNTER — Encounter: Payer: Self-pay | Admitting: Family Medicine

## 2022-05-01 VITALS — BP 102/78 | HR 78 | Temp 98.1°F | Wt 171.1 lb

## 2022-05-01 DIAGNOSIS — J019 Acute sinusitis, unspecified: Secondary | ICD-10-CM | POA: Diagnosis not present

## 2022-05-01 DIAGNOSIS — J029 Acute pharyngitis, unspecified: Secondary | ICD-10-CM

## 2022-05-01 MED ORDER — MECLIZINE HCL 25 MG PO TABS: 25.0000 mg | ORAL_TABLET | ORAL | 2 refills | Status: AC | PRN

## 2022-05-01 MED ORDER — DOXYCYCLINE HYCLATE 100 MG PO CAPS: 100.0000 mg | ORAL_CAPSULE | Freq: Two times a day (BID) | ORAL | 0 refills | Status: AC

## 2022-05-01 NOTE — Progress Notes (Unsigned)
   Subjective:    Patient ID: Tracy Brown, female    DOB: May 19, 1961, 62 y.o.   MRN: [id number]  HPI Here for 3 days of sinus congestion, PND, ST, and a dry cough. No fever. She was seenhereon 03-08-22 for a strep pharyngitis, and she was treated with Cefuroxime. This resolved and she felt fine until the last 3 days. No fever or SOB. Today she tests negative for Covid and strep.   Review of Systems  Constitutional: Negative.   HENT:  Positive for congestion, postnasal drip, sinus pressure and sore throat. Negative for ear pain.   Eyes: Negative.   Respiratory:  Positive for cough. Negative for choking and wheezing.   Gastrointestinal: Negative.        Objective:   Physical Exam Constitutional:      Appearance: Normal appearance.  HENT:     Right Ear: Tympanic membrane, ear canal and external ear normal.     Left Ear: Tympanic membrane, ear canal and external ear normal.     Nose: Nose normal.     Mouth/Throat:     Pharynx: Oropharynx is clear.  Eyes:     Conjunctiva/sclera: Conjunctivae normal.  Pulmonary:     Effort: Pulmonary effort is normal.     Breath sounds: Normal breath sounds.  Lymphadenopathy:     Cervical: No cervical adenopathy.  Neurological:     Mental Status: She is alert.           Assessment & Plan:  Sinusitis, treat with 10 days of Doxycycline. Recheck as needed. Alysia Penna, MD

## 2022-05-02 LAB — POCT RAPID STREP A (OFFICE): Rapid Strep A Screen: NEGATIVE

## 2022-05-02 LAB — POC COVID19 BINAXNOW: SARS Coronavirus 2 Ag: NEGATIVE

## 2022-05-16 ENCOUNTER — Other Ambulatory Visit: Payer: Self-pay | Admitting: Family Medicine

## 2022-05-16 DIAGNOSIS — E559 Vitamin D deficiency, unspecified: Secondary | ICD-10-CM

## 2022-06-05 IMAGING — CT CT CHEST LUNG CANCER SCREENING LOW DOSE W/O CM
2 of 4 series · 15 of 36 positions shown, 18 images · non-contrast
Comparison: Low-dose lung cancer screening CT chest dated
11/29/2020

CLINICAL DATA: 60-year-old female former smoker, quit 8 years ago,
with 37 pack-year history of smoking, for follow-up lung cancer
screening



[Series 3: lung thins 1.0 · axial · 0.66mm/px · z∈[-278,-18]mm · 12 of 287 slices shown, 15 images]
[im 14/287  mediastinal]
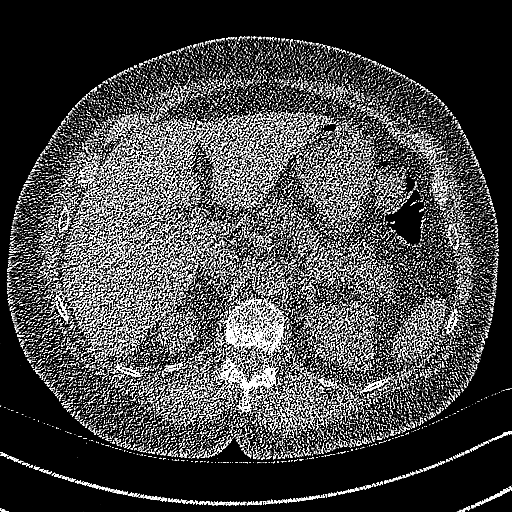
[im 14/287  lung]
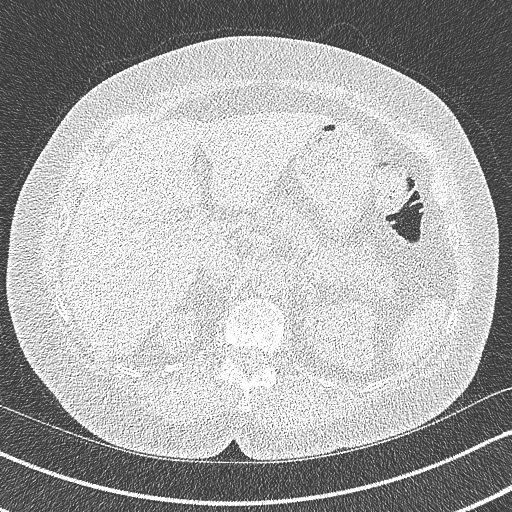
[im 40/287  lung]
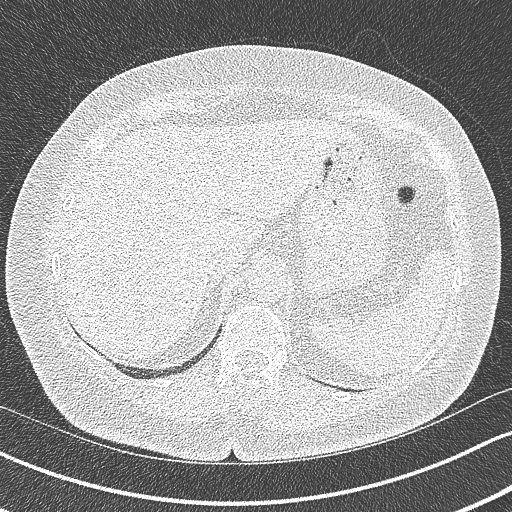
[im 66/287  lung]
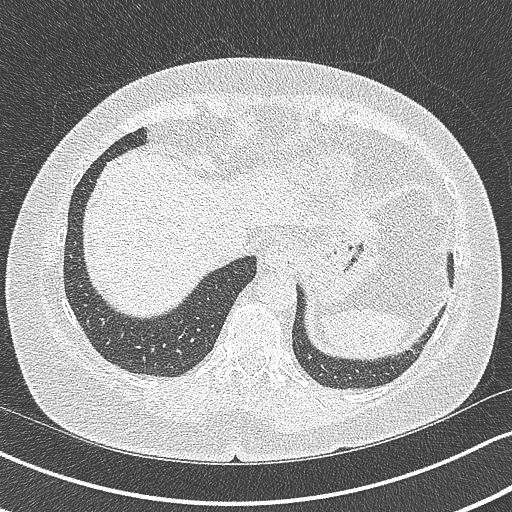
[im 92/287  lung]
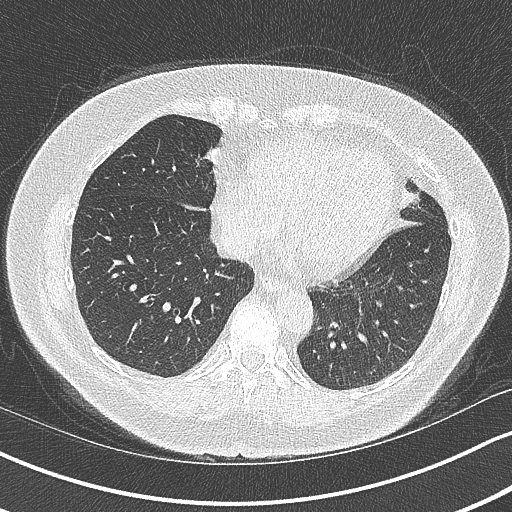
[im 105/287  mediastinal]
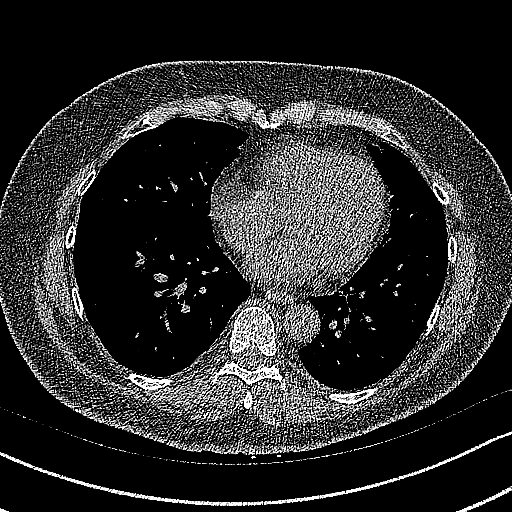
[im 105/287  lung]
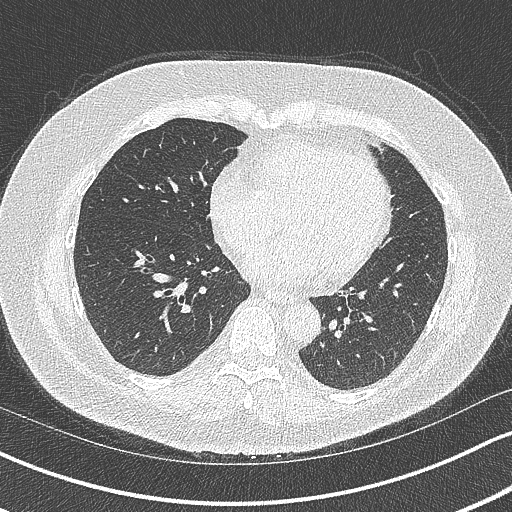
[im 131/287  lung]
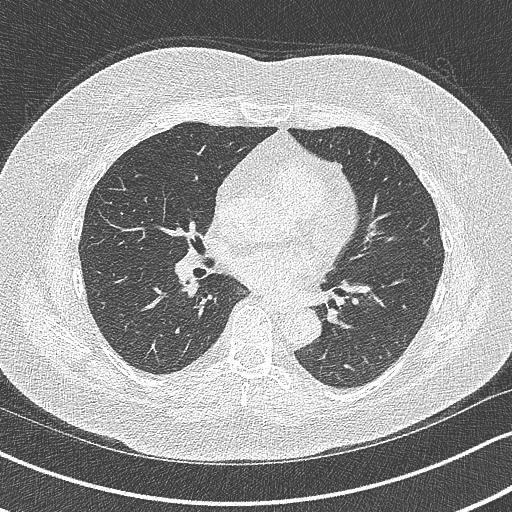
[im 157/287  lung]
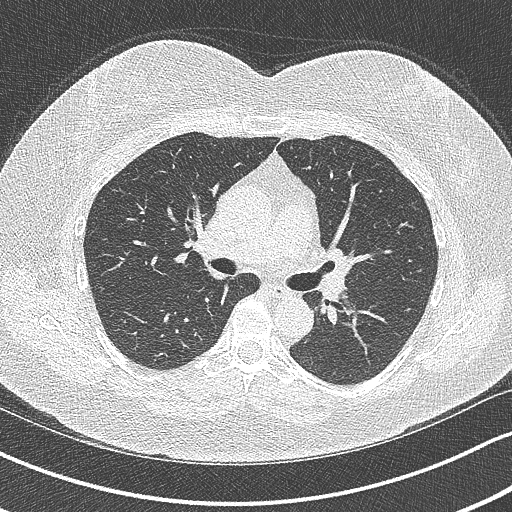
[im 183/287  lung]
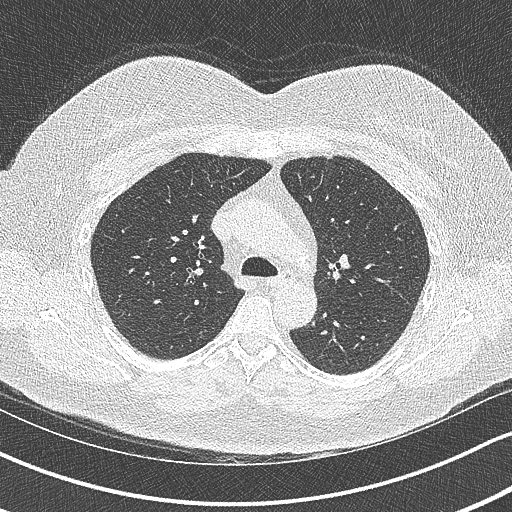
[im 196/287  mediastinal]
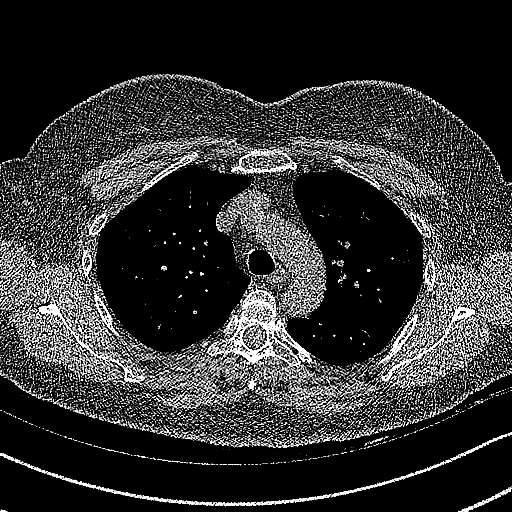
[im 196/287  lung]
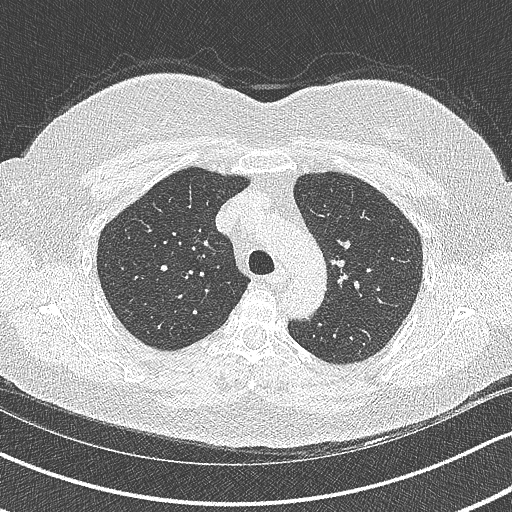
[im 222/287  lung]
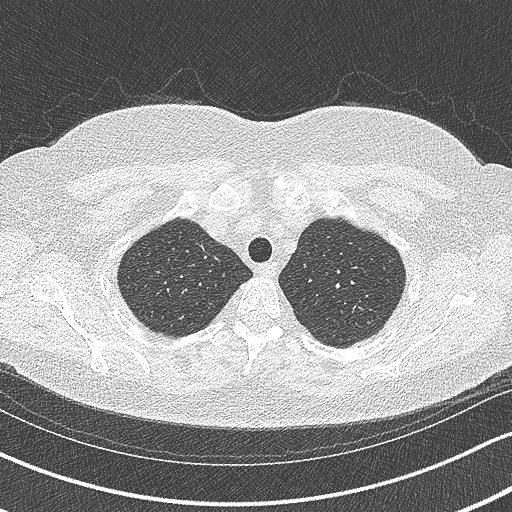
[im 248/287  lung]
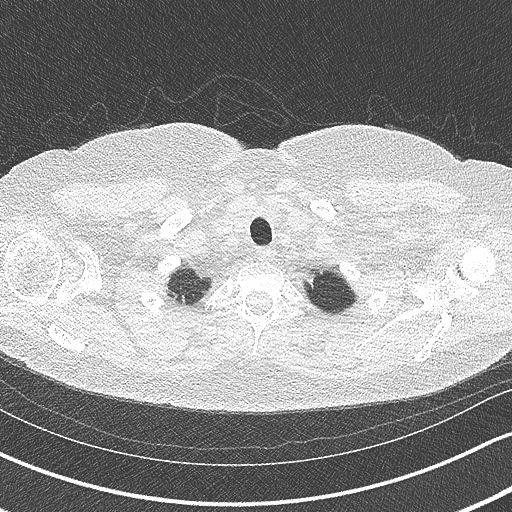
[im 274/287  lung]
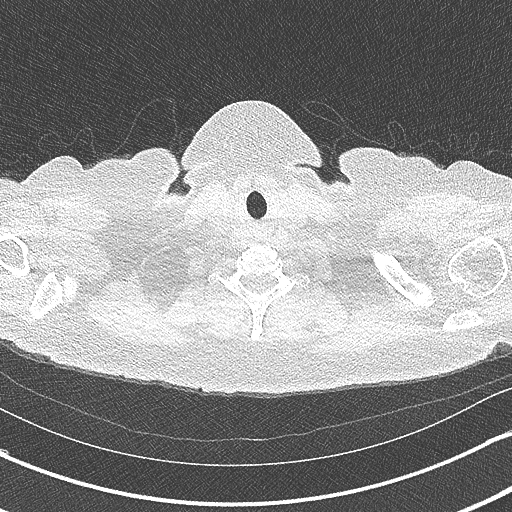

[Series 5: coronal · coronal · 0.56mm/px · 3 of 113 slices shown]
[im 23/113  lung]
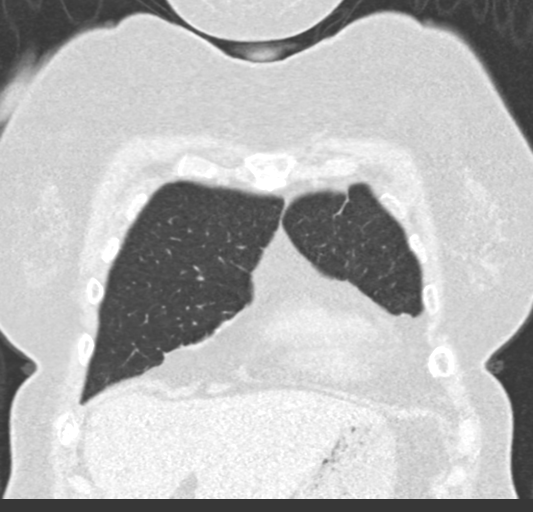
[im 45/113  lung]
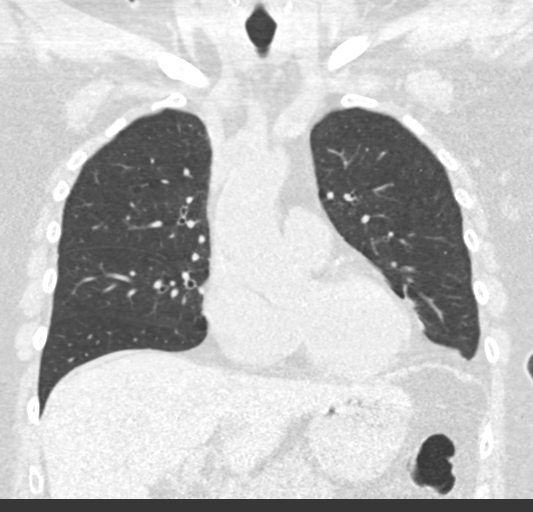
[im 68/113  lung]
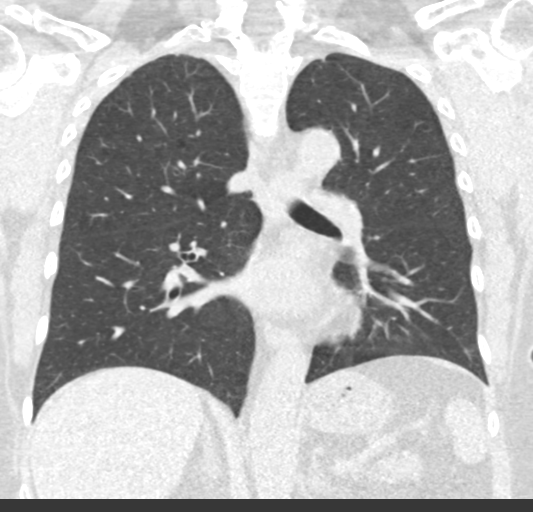

[15 of 36 positions shown; findings below may reference images not displayed]

FINDINGS: Cardiovascular: Heart is normal in size.  No pericardial effusion.

No evidence of thoracic aortic aneurysm. Mild atherosclerotic
calcifications of the aortic arch.

Coronary atherosclerosis of the LAD.

Mediastinum/Nodes: No suspicious mediastinal lymphadenopathy.

Visualized thyroid is unremarkable.

Lungs/Pleura: Mild centrilobular emphysematous changes, upper lung
predominant.

No focal consolidation.

Scattered small bilateral pulmonary nodules, measuring 3.8 mm in the
posterior right upper lobe.

No pleural effusion or pneumothorax.

Upper Abdomen: Visualized upper abdomen is grossly unremarkable.

Musculoskeletal: Visualized osseous structures are within normal
limits.
IMPRESSION: Lung-RADS 2, benign appearance or behavior. Continue annual
screening with low-dose chest CT without contrast in 12 months.

Aortic Atherosclerosis (N9PX4-98L.L) and Emphysema (N9PX4-FO2.V).

## 2022-06-11 ENCOUNTER — Other Ambulatory Visit: Payer: Self-pay | Admitting: Family Medicine

## 2022-07-19 ENCOUNTER — Ambulatory Visit (INDEPENDENT_AMBULATORY_CARE_PROVIDER_SITE_OTHER): Payer: No Typology Code available for payment source | Admitting: Family Medicine

## 2022-07-19 ENCOUNTER — Encounter: Payer: Self-pay | Admitting: Family Medicine

## 2022-07-19 VITALS — BP 112/78 | HR 73 | Temp 98.1°F | Ht 62.0 in | Wt 173.0 lb

## 2022-07-19 DIAGNOSIS — Z0000 Encounter for general adult medical examination without abnormal findings: Secondary | ICD-10-CM

## 2022-07-19 DIAGNOSIS — E669 Obesity, unspecified: Secondary | ICD-10-CM

## 2022-07-19 DIAGNOSIS — Z23 Encounter for immunization: Secondary | ICD-10-CM | POA: Diagnosis not present

## 2022-07-19 DIAGNOSIS — Z6831 Body mass index (BMI) 31.0-31.9, adult: Secondary | ICD-10-CM

## 2022-07-19 DIAGNOSIS — R012 Other cardiac sounds: Secondary | ICD-10-CM | POA: Diagnosis not present

## 2022-07-19 LAB — CBC WITH DIFFERENTIAL/PLATELET
Basophils Absolute: 0 10*3/uL (ref 0.0–0.1)
Basophils Relative: 0.8 % (ref 0.0–3.0)
Eosinophils Absolute: 0.1 10*3/uL (ref 0.0–0.7)
Eosinophils Relative: 2.1 % (ref 0.0–5.0)
HCT: 41 % (ref 36.0–46.0)
Hemoglobin: 13.6 g/dL (ref 12.0–15.0)
Lymphocytes Relative: 40.6 % (ref 12.0–46.0)
Lymphs Abs: 2.5 10*3/uL (ref 0.7–4.0)
MCHC: 33.1 g/dL (ref 30.0–36.0)
MCV: 84.4 fl (ref 78.0–100.0)
Monocytes Absolute: 0.5 10*3/uL (ref 0.1–1.0)
Monocytes Relative: 7.6 % (ref 3.0–12.0)
Neutro Abs: 3 10*3/uL (ref 1.4–7.7)
Neutrophils Relative %: 48.9 % (ref 43.0–77.0)
Platelets: 252 10*3/uL (ref 150.0–400.0)
RBC: 4.86 Mil/uL (ref 3.87–5.11)
RDW: 13.4 % (ref 11.5–15.5)
WBC: 6.1 10*3/uL (ref 4.0–10.5)

## 2022-07-19 LAB — LIPID PANEL
Cholesterol: 133 mg/dL (ref 0–200)
HDL: 65.5 mg/dL (ref >39.00)
LDL Cholesterol: 55 mg/dL (ref 0–99)
NonHDL: 67.89
Total CHOL/HDL Ratio: 2
Triglycerides: 66 mg/dL (ref 0.0–149.0)
VLDL: 13.2 mg/dL (ref 0.0–40.0)

## 2022-07-19 LAB — BASIC METABOLIC PANEL
BUN: 15 mg/dL (ref 6–23)
CO2: 29 mEq/L (ref 19–32)
Calcium: 9.8 mg/dL (ref 8.4–10.5)
Chloride: 105 mEq/L (ref 96–112)
Creatinine, Ser: 0.77 mg/dL (ref 0.40–1.20)
GFR: 83.51 mL/min (ref >60.00)
Glucose, Bld: 90 mg/dL (ref 70–99)
Potassium: 4.1 mEq/L (ref 3.5–5.1)
Sodium: 139 mEq/L (ref 135–145)

## 2022-07-19 LAB — HEPATIC FUNCTION PANEL
ALT: 20 U/L (ref 0–35)
AST: 19 U/L (ref 0–37)
Albumin: 4.8 g/dL (ref 3.5–5.2)
Alkaline Phosphatase: 62 U/L (ref 39–117)
Bilirubin, Direct: 0.1 mg/dL (ref 0.0–0.3)
Total Bilirubin: 0.4 mg/dL (ref 0.2–1.2)
Total Protein: 7.6 g/dL (ref 6.0–8.3)

## 2022-07-19 LAB — TSH: TSH: 0.9 u[IU]/mL (ref 0.35–5.50)

## 2022-07-19 LAB — HEMOGLOBIN A1C: Hgb A1c MFr Bld: 5.9 % (ref 4.6–6.5)

## 2022-07-19 MED ORDER — TIRZEPATIDE 2.5 MG/0.5ML ~~LOC~~ SOAJ: 2.5000 mg | SUBCUTANEOUS | 0 refills | Status: DC

## 2022-07-19 MED ORDER — LEVOTHYROXINE SODIUM 88 MCG PO TABS: 88.0000 ug | ORAL_TABLET | Freq: Every day | ORAL | 3 refills | Status: DC

## 2022-07-19 NOTE — Progress Notes (Signed)
+  Subjective:    Patient ID: Tracy Brown, female    DOB: 1960-10-14, 61 y.o.   MRN: [id number]  HPI Here for a well exam. She feels fine but she is frustrated by her inability to lose weight. She has met with a nutritionist and she exercises regularly with no results. She tried Metformin and Phentermine, but she stopped both due to side effects. She asks to tyr [facility].    Review of Systems  Constitutional: Negative.   HENT: Negative.    Eyes: Negative.   Respiratory: Negative.    Cardiovascular: Negative.   Gastrointestinal: Negative.   Genitourinary:  Negative for decreased urine volume, difficulty urinating, dyspareunia, dysuria, enuresis, flank pain, frequency, hematuria, pelvic pain and urgency.  Musculoskeletal: Negative.   Skin: Negative.   Neurological: Negative.  Negative for headaches.  Psychiatric/Behavioral: Negative.         Objective:   Physical Exam Constitutional:      General: She is not in acute distress.    Appearance: She is well-developed. She is obese.  HENT:     Head: Normocephalic and atraumatic.     Right Ear: External ear normal.     Left Ear: External ear normal.     Nose: Nose normal.     Mouth/Throat:     Pharynx: No oropharyngeal exudate.  Eyes:     General: No scleral icterus.    Conjunctiva/sclera: Conjunctivae normal.     Pupils: Pupils are equal, round, and reactive to light.  Neck:     Thyroid: No thyromegaly.     Vascular: No JVD.  Cardiovascular:     Rate and Rhythm: Normal rate and regular rhythm.     Pulses: Normal pulses.     Heart sounds: No murmur heard.    No friction rub. No gallop.     Comments: Her S2 sounds a bit odd today, it has almost a clicking quality  Pulmonary:     Effort: Pulmonary effort is normal. No respiratory distress.     Breath sounds: Normal breath sounds. No wheezing or rales.  Chest:     Chest wall: No tenderness.  Abdominal:     General: Bowel sounds are normal. There is no distension.      Palpations: Abdomen is soft. There is no mass.     Tenderness: There is no abdominal tenderness. There is no guarding or rebound.  Musculoskeletal:        General: No tenderness. Normal range of motion.     Cervical back: Normal range of motion and neck supple.  Lymphadenopathy:     Cervical: No cervical adenopathy.  Skin:    General: Skin is warm and dry.     Findings: No erythema or rash.  Neurological:     Mental Status: She is alert and oriented to person, place, and time.     Cranial Nerves: No cranial nerve deficit.     Motor: No abnormal muscle tone.     Coordination: Coordination normal.     Deep Tendon Reflexes: Reflexes are normal and symmetric. Reflexes normal.  Psychiatric:        Behavior: Behavior normal.        Thought Content: Thought content normal.        Judgment: Judgment normal.           Assessment & Plan:  Well exam. We discussed diet and exercise. Get fasting labs. We sent in a RX for Mounjaro 2.5 mg weekly for 4 weeks. We  will set up an ECHO to evaluate her heart valves.  Alysia Penna, MD

## 2022-07-19 NOTE — Addendum Note (Signed)
Addended by: Wyvonne Lenz on: 07/19/2022 11:17 AM   Modules accepted: Orders

## 2022-07-24 ENCOUNTER — Encounter: Payer: Self-pay | Admitting: Family Medicine

## 2022-07-25 ENCOUNTER — Other Ambulatory Visit (HOSPITAL_COMMUNITY): Payer: Self-pay

## 2022-07-26 ENCOUNTER — Telehealth: Payer: Self-pay

## 2022-07-26 NOTE — Telephone Encounter (Signed)
A  copy of the physician assessment form was mailed to pt address on file as requested by pt

## 2022-07-29 NOTE — Telephone Encounter (Signed)
(  1) please tell her that waiting until mid January is fine for the ECHO, this is not urgent, (2) please get the PA going for the [facility]

## 2022-07-30 NOTE — Telephone Encounter (Signed)
Spoke with pt advised per Dr [name] response. PA for Darcel Bayley is being processed by PA Team

## 2022-08-26 ENCOUNTER — Ambulatory Visit (HOSPITAL_COMMUNITY): Payer: No Typology Code available for payment source | Attending: Cardiology

## 2022-08-26 DIAGNOSIS — R012 Other cardiac sounds: Secondary | ICD-10-CM

## 2022-08-26 LAB — ECHOCARDIOGRAM COMPLETE
Area-P 1/2: 3.84 cm2
S' Lateral: 2.8 cm

## 2022-10-10 ENCOUNTER — Other Ambulatory Visit: Payer: Self-pay | Admitting: Family Medicine

## 2022-10-10 DIAGNOSIS — Z1231 Encounter for screening mammogram for malignant neoplasm of breast: Secondary | ICD-10-CM

## 2022-10-11 ENCOUNTER — Ambulatory Visit
Admission: RE | Admit: 2022-10-11 | Discharge: 2022-10-11 | Disposition: A | Discharge status: Home / Self Care | Payer: No Typology Code available for payment source | Source: Ambulatory Visit | Attending: Family Medicine | Admitting: Family Medicine

## 2022-10-11 DIAGNOSIS — Z1231 Encounter for screening mammogram for malignant neoplasm of breast: Secondary | ICD-10-CM

## 2022-12-03 ENCOUNTER — Ambulatory Visit
Admission: RE | Admit: 2022-12-03 | Discharge: 2022-12-03 | Disposition: A | Discharge status: Home / Self Care | Payer: No Typology Code available for payment source | Source: Ambulatory Visit | Attending: Acute Care | Admitting: Acute Care

## 2022-12-03 DIAGNOSIS — Z122 Encounter for screening for malignant neoplasm of respiratory organs: Secondary | ICD-10-CM

## 2022-12-03 DIAGNOSIS — Z87891 Personal history of nicotine dependence: Secondary | ICD-10-CM

## 2022-12-09 ENCOUNTER — Other Ambulatory Visit: Payer: Self-pay

## 2022-12-09 DIAGNOSIS — Z87891 Personal history of nicotine dependence: Secondary | ICD-10-CM

## 2023-04-08 ENCOUNTER — Other Ambulatory Visit: Payer: Self-pay | Admitting: Cardiovascular Disease

## 2023-05-19 ENCOUNTER — Other Ambulatory Visit: Payer: Self-pay | Admitting: Family Medicine

## 2023-05-19 DIAGNOSIS — E559 Vitamin D deficiency, unspecified: Secondary | ICD-10-CM

## 2023-05-20 NOTE — Telephone Encounter (Signed)
Pt LOV 07/19/2022 Pt LOV was on 05/16/22 Please advise

## 2023-06-24 ENCOUNTER — Other Ambulatory Visit: Payer: Self-pay | Admitting: Cardiovascular Disease

## 2023-07-25 ENCOUNTER — Encounter: Payer: Self-pay | Admitting: Family Medicine

## 2023-07-25 ENCOUNTER — Ambulatory Visit: Payer: No Typology Code available for payment source | Admitting: Family Medicine

## 2023-07-25 VITALS — BP 118/80 | HR 73 | Temp 97.8°F | Ht 62.0 in | Wt 167.0 lb

## 2023-07-25 DIAGNOSIS — Z0000 Encounter for general adult medical examination without abnormal findings: Secondary | ICD-10-CM | POA: Diagnosis not present

## 2023-07-25 DIAGNOSIS — E038 Other specified hypothyroidism: Secondary | ICD-10-CM | POA: Diagnosis not present

## 2023-07-25 DIAGNOSIS — Z131 Encounter for screening for diabetes mellitus: Secondary | ICD-10-CM

## 2023-07-25 LAB — LIPID PANEL
Cholesterol: 136 mg/dL (ref 0–200)
HDL: 66.8 mg/dL (ref >39.00)
LDL Cholesterol: 55 mg/dL (ref 0–99)
NonHDL: 69.49
Total CHOL/HDL Ratio: 2
Triglycerides: 72 mg/dL (ref 0.0–149.0)
VLDL: 14.4 mg/dL (ref 0.0–40.0)

## 2023-07-25 LAB — BASIC METABOLIC PANEL
BUN: 14 mg/dL (ref 6–23)
CO2: 27 meq/L (ref 19–32)
Calcium: 9.4 mg/dL (ref 8.4–10.5)
Chloride: 104 meq/L (ref 96–112)
Creatinine, Ser: 0.76 mg/dL (ref 0.40–1.20)
GFR: 84.23 mL/min (ref >60.00)
Glucose, Bld: 92 mg/dL (ref 70–99)
Potassium: 4.1 meq/L (ref 3.5–5.1)
Sodium: 140 meq/L (ref 135–145)

## 2023-07-25 LAB — CBC WITH DIFFERENTIAL/PLATELET
Basophils Absolute: 0 10*3/uL (ref 0.0–0.1)
Basophils Relative: 0.8 % (ref 0.0–3.0)
Eosinophils Absolute: 0.1 10*3/uL (ref 0.0–0.7)
Eosinophils Relative: 2.3 % (ref 0.0–5.0)
HCT: 39.9 % (ref 36.0–46.0)
Hemoglobin: 13.3 g/dL (ref 12.0–15.0)
Lymphocytes Relative: 33.9 % (ref 12.0–46.0)
Lymphs Abs: 2.1 10*3/uL (ref 0.7–4.0)
MCHC: 33.3 g/dL (ref 30.0–36.0)
MCV: 86.5 fL (ref 78.0–100.0)
Monocytes Absolute: 0.5 10*3/uL (ref 0.1–1.0)
Monocytes Relative: 7.8 % (ref 3.0–12.0)
Neutro Abs: 3.4 10*3/uL (ref 1.4–7.7)
Neutrophils Relative %: 55.2 % (ref 43.0–77.0)
Platelets: 232 10*3/uL (ref 150.0–400.0)
RBC: 4.61 Mil/uL (ref 3.87–5.11)
RDW: 13.4 % (ref 11.5–15.5)
WBC: 6.2 10*3/uL (ref 4.0–10.5)

## 2023-07-25 LAB — HEPATIC FUNCTION PANEL
ALT: 22 U/L (ref 0–35)
AST: 20 U/L (ref 0–37)
Albumin: 4.7 g/dL (ref 3.5–5.2)
Alkaline Phosphatase: 67 U/L (ref 39–117)
Bilirubin, Direct: 0.1 mg/dL (ref 0.0–0.3)
Total Bilirubin: 0.5 mg/dL (ref 0.2–1.2)
Total Protein: 7.3 g/dL (ref 6.0–8.3)

## 2023-07-25 LAB — HEMOGLOBIN A1C: Hgb A1c MFr Bld: 5.9 % (ref 4.6–6.5)

## 2023-07-25 LAB — T3, FREE: T3, Free: 3 pg/mL (ref 2.3–4.2)

## 2023-07-25 LAB — TSH: TSH: 1.22 u[IU]/mL (ref 0.35–5.50)

## 2023-07-25 LAB — T4, FREE: Free T4: 0.79 ng/dL (ref 0.60–1.60)

## 2023-07-25 MED ORDER — LEVOTHYROXINE SODIUM 88 MCG PO TABS: 88.0000 ug | ORAL_TABLET | Freq: Every day | ORAL | 3 refills | Status: DC

## 2023-07-25 NOTE — Progress Notes (Signed)
   Subjective:    Patient ID: Tracy Brown, female    DOB: 08-30-1960, 62 y.o.   MRN: [id number]  HPI Here for a well exam. She feels fine.    Review of Systems  Constitutional: Negative.   HENT: Negative.    Eyes: Negative.   Respiratory: Negative.    Cardiovascular: Negative.   Gastrointestinal: Negative.   Genitourinary:  Negative for decreased urine volume, difficulty urinating, dyspareunia, dysuria, enuresis, flank pain, frequency, hematuria, pelvic pain and urgency.  Musculoskeletal: Negative.   Skin: Negative.   Neurological: Negative.  Negative for headaches.  Psychiatric/Behavioral: Negative.         Objective:   Physical Exam Constitutional:      General: She is not in acute distress.    Appearance: Normal appearance. She is well-developed.  HENT:     Head: Normocephalic and atraumatic.     Right Ear: External ear normal.     Left Ear: External ear normal.     Nose: Nose normal.     Mouth/Throat:     Pharynx: No oropharyngeal exudate.  Eyes:     General: No scleral icterus.    Conjunctiva/sclera: Conjunctivae normal.     Pupils: Pupils are equal, round, and reactive to light.  Neck:     Thyroid: No thyromegaly.     Vascular: No JVD.  Cardiovascular:     Rate and Rhythm: Normal rate and regular rhythm.     Pulses: Normal pulses.     Heart sounds: Normal heart sounds. No murmur heard.    No friction rub. No gallop.  Pulmonary:     Effort: Pulmonary effort is normal. No respiratory distress.     Breath sounds: Normal breath sounds. No wheezing or rales.  Chest:     Chest wall: No tenderness.  Abdominal:     General: Bowel sounds are normal. There is no distension.     Palpations: Abdomen is soft. There is no mass.     Tenderness: There is no abdominal tenderness. There is no guarding or rebound.  Musculoskeletal:        General: No tenderness. Normal range of motion.     Cervical back: Normal range of motion and neck supple.  Lymphadenopathy:      Cervical: No cervical adenopathy.  Skin:    General: Skin is warm and dry.     Findings: No erythema or rash.  Neurological:     General: No focal deficit present.     Mental Status: She is alert and oriented to person, place, and time.     Cranial Nerves: No cranial nerve deficit.     Motor: No abnormal muscle tone.     Coordination: Coordination normal.     Deep Tendon Reflexes: Reflexes are normal and symmetric. Reflexes normal.  Psychiatric:        Mood and Affect: Mood normal.        Behavior: Behavior normal.        Thought Content: Thought content normal.        Judgment: Judgment normal.           Assessment & Plan:  Well exam. We discussed diet and exercise. Get fasting labs. Gershon Crane, MD

## 2023-07-30 ENCOUNTER — Other Ambulatory Visit: Payer: Self-pay | Admitting: Cardiovascular Disease

## 2023-08-18 ENCOUNTER — Telehealth: Payer: Self-pay | Admitting: Family Medicine

## 2023-08-18 DIAGNOSIS — Z0279 Encounter for issue of other medical certificate: Secondary | ICD-10-CM

## 2023-08-18 NOTE — Telephone Encounter (Signed)
 Patient dropped off document Insurance Form, to be filled out by provider. Patient requested to send it back via Fax. Pt would also like a copy mailed to her. within ASAP. Document is located in providers tray at front office.Please advise at Mobile [phone] (mobile)

## 2023-08-20 ENCOUNTER — Other Ambulatory Visit: Payer: Self-pay | Admitting: Family Medicine

## 2023-08-20 ENCOUNTER — Encounter: Payer: Self-pay | Admitting: Family Medicine

## 2023-08-20 DIAGNOSIS — E88819 Insulin resistance, unspecified: Secondary | ICD-10-CM

## 2023-08-22 NOTE — Telephone Encounter (Signed)
 Pt form has been completed and faxed to the fax number provided. Copy at [facility]

## 2023-08-26 ENCOUNTER — Other Ambulatory Visit: Payer: Self-pay | Admitting: Cardiovascular Disease

## 2023-08-29 MED ORDER — METFORMIN HCL 500 MG PO TABS: 500.0000 mg | ORAL_TABLET | Freq: Every day | ORAL | 5 refills | Status: DC

## 2023-08-29 NOTE — Telephone Encounter (Signed)
Call in Metformin 500 mg one every morning , #30 with 5 rf

## 2023-08-29 NOTE — Telephone Encounter (Signed)
Please advise if to send a new Rx for Metformin

## 2023-08-29 NOTE — Addendum Note (Signed)
Addended by: Carola Rhine on: 08/29/2023 04:48 PM   Modules accepted: Orders

## 2023-09-12 NOTE — Telephone Encounter (Signed)
I would need to examine this first so we can answer that question

## 2023-09-19 ENCOUNTER — Encounter: Payer: Self-pay | Admitting: Family Medicine

## 2023-09-19 ENCOUNTER — Ambulatory Visit: Payer: No Typology Code available for payment source | Admitting: Family Medicine

## 2023-09-19 VITALS — BP 128/72 | HR 75 | Temp 98.0°F | Ht 62.0 in | Wt 173.8 lb

## 2023-09-19 DIAGNOSIS — S76011A Strain of muscle, fascia and tendon of right hip, initial encounter: Secondary | ICD-10-CM | POA: Diagnosis not present

## 2023-09-19 MED ORDER — CELECOXIB 200 MG PO CAPS: 200.0000 mg | ORAL_CAPSULE | Freq: Two times a day (BID) | ORAL | 0 refills | Status: DC

## 2023-09-19 NOTE — Progress Notes (Signed)
   Subjective:    Patient ID: Mykhia K Bosque, female    DOB: 10-Feb-1961, 63 y.o.   MRN: [id number]  HPI Here for 2 weeks of pain in the lateral right hip area. It hurts to walk but not to sit. No recent trauma. Ibuprofen has not helped.    Review of Systems  Constitutional: Negative.   Respiratory: Negative.    Cardiovascular: Negative.   Musculoskeletal:  Positive for arthralgias.       Objective:   Physical Exam Constitutional:      Appearance: Normal appearance.  Cardiovascular:     Rate and Rhythm: Normal rate and regular rhythm.     Pulses: Normal pulses.     Heart sounds: Normal heart sounds.  Pulmonary:     Effort: Pulmonary effort is normal.     Breath sounds: Normal breath sounds.  Musculoskeletal:     Comments: She is tender over the lateral right hip and adduction against resistance causes pain. The hip has full ROM  Neurological:     Mental Status: She is alert.           Assessment & Plan:  Right hip adductor strain. Rest, apply ice. Try Celebrex  200 mg BID. Recheck as needed.  Garnette Olmsted, MD

## 2023-10-07 ENCOUNTER — Other Ambulatory Visit: Payer: Self-pay | Admitting: Family Medicine

## 2023-10-07 DIAGNOSIS — Z1231 Encounter for screening mammogram for malignant neoplasm of breast: Secondary | ICD-10-CM

## 2023-10-15 ENCOUNTER — Ambulatory Visit
Admission: RE | Admit: 2023-10-15 | Discharge: 2023-10-15 | Disposition: A | Discharge status: Home / Self Care | Payer: No Typology Code available for payment source | Source: Ambulatory Visit | Attending: Family Medicine | Admitting: Family Medicine

## 2023-10-15 DIAGNOSIS — Z1231 Encounter for screening mammogram for malignant neoplasm of breast: Secondary | ICD-10-CM

## 2023-10-22 ENCOUNTER — Ambulatory Visit (INDEPENDENT_AMBULATORY_CARE_PROVIDER_SITE_OTHER): Admitting: Family Medicine

## 2023-10-22 ENCOUNTER — Encounter: Payer: Self-pay | Admitting: Family Medicine

## 2023-10-22 VITALS — BP 124/72 | HR 78 | Temp 97.7°F

## 2023-10-22 DIAGNOSIS — G8929 Other chronic pain: Secondary | ICD-10-CM | POA: Diagnosis not present

## 2023-10-22 DIAGNOSIS — M25551 Pain in right hip: Secondary | ICD-10-CM | POA: Diagnosis not present

## 2023-10-22 NOTE — Progress Notes (Signed)
   Subjective:    Patient ID: Tracy Brown, female    DOB: September 19, 1960, 63 y.o.   MRN: [id number]  HPI Here to follow up on right hip pain that started 4 weeks ago. This has not improved at all. She feels better when she is off her feet, but the pain comes back if she walks for any distance. She tried taking Celebrex, but she stopped this due to GI upset. No back pain.   Review of Systems  Constitutional: Negative.   Respiratory: Negative.    Cardiovascular: Negative.   Gastrointestinal: Negative.   Musculoskeletal:  Positive for arthralgias.       Objective:   Physical Exam Constitutional:      Appearance: Normal appearance.     Comments: Normal gait   Cardiovascular:     Rate and Rhythm: Normal rate and regular rhythm.     Pulses: Normal pulses.     Heart sounds: Normal heart sounds.  Pulmonary:     Effort: Pulmonary effort is normal.     Breath sounds: Normal breath sounds.  Musculoskeletal:     Comments: The right hip has full ROM. She is tender over the postero-lateral hip area   Neurological:     Mental Status: She is alert.           Assessment & Plan:  Right hip pain. Refer to Sports Medicine.  Gershon Crane, MD

## 2023-11-06 ENCOUNTER — Other Ambulatory Visit: Payer: Self-pay | Admitting: Cardiovascular Disease

## 2023-11-17 ENCOUNTER — Telehealth: Payer: Self-pay | Admitting: Acute Care

## 2023-11-17 NOTE — Telephone Encounter (Signed)
 Returned call to patient from VM>  no answer.  Left message and call back number to schedule annual LDCT

## 2023-12-04 ENCOUNTER — Other Ambulatory Visit: Payer: Self-pay | Admitting: *Deleted

## 2023-12-04 ENCOUNTER — Inpatient Hospital Stay: Admission: RE | Admit: 2023-12-04 | Source: Ambulatory Visit

## 2023-12-04 DIAGNOSIS — Z122 Encounter for screening for malignant neoplasm of respiratory organs: Secondary | ICD-10-CM

## 2023-12-04 DIAGNOSIS — Z87891 Personal history of nicotine dependence: Secondary | ICD-10-CM

## 2023-12-05 ENCOUNTER — Other Ambulatory Visit: Payer: Self-pay | Admitting: Cardiovascular Disease

## 2023-12-08 NOTE — Telephone Encounter (Signed)
 This pt of Dr Arlester Ladd was last seen in 03/2022. She had a PRN return. She has had 3 attempts. Does Dr. Katheryne Pane want to refill? Please advise.

## 2023-12-11 NOTE — Progress Notes (Signed)
 [facility] [address] [place] [address] Phone: [phone] Subjective:   Tracy Brown, am serving as a scribe for Dr. Ronnell Coins.  I'm seeing this patient by the request  of:  Donley Furth, MD  CC: Hip pain  [id number]  Tracy Brown is a 63 y.o. female coming in with complaint of R hip pain and RLQ pain.  Reviewing primary care notes patient has been having pain for nearly 3 months at this time. Has not been able to be as active due to allergies. Denies any radiating symptoms.     Past Medical History:  Diagnosis Date   Allergy    Anemia    Chickenpox    Colon polyps    Diverticulosis    Heart murmur    History of recurrent UTIs    Hyperlipidemia    Hypertension    Hypothyroid    Nephrolithiasis    OA (osteoarthritis)    Serrated adenoma of colon 02/2017   Past Surgical History:  Procedure Laterality Date   APPENDECTOMY     BREAST CYST ASPIRATION Right    COLON SURGERY  03/2011   right colectomy, per Dr. Jerryl Morin, for tubular  adenoma    COLONOSCOPY  02/21/2022   per Dr. Tova Fresh, polyps, repeat in 5 years   HEMORRHOID SURGERY     HERNIA REPAIR     RIGHT COLECTOMY  03/08/2011   per Dr. Jerryl Morin, for tubulovillous adenoma   TONSILLECTOMY     Social History   Socioeconomic History   Marital status: Divorced    Spouse name: Not on file   Number of children: 1   Years of education: Not on file   Highest education level: Not on file  Occupational History   Not on file  Tobacco Use   Smoking status: Former    Current packs/day: 0.00    Average packs/day: 1 pack/day for 37.0 years (37.0 ttl pk-yrs)    Types: Cigarettes    Start date: 10/11/1979    Quit date: 10/10/2016    Years since quitting: 7.1   Smokeless tobacco: Never  Vaping Use   Vaping status: Never Used  Substance and Sexual Activity   Alcohol use: Yes    Alcohol/week: 0.0 standard drinks of alcohol    Comment: once a month   Drug use: No    Sexual activity: Yes  Other Topics Concern   Not on file  Social History Narrative   Not on file   Social Drivers of Health   Financial Resource Strain: Not on file  Food Insecurity: Not on file  Transportation Needs: Not on file  Physical Activity: Not on file  Stress: Not on file  Social Connections: Not on file   Allergies  Allergen Reactions   Hydrocodone    Phentermine  Itching   Zithromax [Azithromycin Dihydrate]    Family History  Problem Relation Age of Onset   Cancer Other        breast,colon,ovarian   Coronary artery disease Other    Hypertension Other    Stroke Other    Leukemia Father    Cancer Father    Breast cancer Cousin    Thyroid  disease Mother    Hypertension Mother    Stroke Mother    Breast cancer Maternal Grandmother     Current Outpatient Medications (Endocrine & Metabolic):    levothyroxine  (SYNTHROID ) 88 MCG tablet, Take 1 tablet (88 mcg total) by mouth daily.  metFORMIN  (GLUCOPHAGE ) 500 MG tablet, Take 1 tablet (500 mg total) by mouth daily with breakfast.  Current Outpatient Medications (Cardiovascular):    rosuvastatin  (CRESTOR ) 20 MG tablet, Take 1 tablet (20 mg total) by mouth daily.  Current Outpatient Medications (Respiratory):    cetirizine (ZYRTEC) 10 MG tablet, Take 10 mg by mouth daily.  Current Outpatient Medications (Analgesics):    aspirin 81 MG chewable tablet, Chew 81 mg by mouth daily.   celecoxib  (CELEBREX ) 200 MG capsule, Take 1 capsule (200 mg total) by mouth 2 (two) times daily.   ibuprofen (ADVIL,MOTRIN) 200 MG tablet, Take 200 mg by mouth every 6 (six) hours as needed.   Current Outpatient Medications (Other):    ascorbic acid (VITAMIN C) 500 MG tablet, Take 500 mg by mouth daily.   azelastine  (OPTIVAR ) 0.05 % ophthalmic solution, Place 1 drop into both eyes 2 (two) times daily.   meclizine  (ANTIVERT ) 25 MG tablet, Take 1 tablet (25 mg total) by mouth every 4 (four) hours as needed for dizziness.   MULTIPLE  VITAMINS PO, Take by mouth.   naloxone  (NARCAN ) nasal spray 4 mg/0.1 mL, As needed   Saccharomyces boulardii (PROBIOTIC) 250 MG CAPS, Take by mouth.   Vitamin D , Ergocalciferol , (DRISDOL ) 1.25 MG (50000 UNIT) CAPS capsule, TAKE 1 CAPSULE BY MOUTH EVERY 14 DAYS   zinc gluconate 50 MG tablet, Take 50 mg by mouth daily.   Reviewed prior external information including notes and imaging from  primary care provider As well as notes that were available from care everywhere and other healthcare systems.  Past medical history, social, surgical and family history all reviewed in electronic medical record.  No pertanent information unless stated regarding to the chief complaint.   Review of Systems:  No headache, visual changes, nausea, vomiting, diarrhea, constipation, dizziness, abdominal pain, skin rash, fevers, chills, night sweats, weight loss, swollen lymph nodes, body aches, joint swelling, chest pain, shortness of breath, mood changes. POSITIVE muscle aches  Objective  Blood pressure 110/78, pulse 90, height 5\' 2"  (1.575 m), weight 180 lb (81.6 kg), SpO2 98%.   General: No apparent distress alert and oriented x3 mood and affect normal, dressed appropriately.  HEENT: Pupils equal, extraocular movements intact  Respiratory: Patient's speak in full sentences and does not appear short of breath  Cardiovascular: No lower extremity edema, non tender, no erythema  Hip exam showed patient does have relatively good range of motion but is severely tender to palpation over the greater trochanteric area.  Some tightness with Veldon German right greater than left.  Negative straight leg test noted.  No pain with resisted hip flexion noted.   Procedure: Real-time Ultrasound Guided Injection of right greater trochanteric bursitis secondary to patient's body habitus Device: GE Logiq Q7  Ultrasound guided injection is preferred based studies that show increased duration, increased effect, greater accuracy, decreased  procedural pain, increased response rate, and decreased cost with ultrasound guided versus blind injection.  Verbal informed consent obtained.  Time-out conducted.  Noted no overlying erythema, induration, or other signs of local infection.  Skin prepped in a sterile fashion.  Local anesthesia: Topical Ethyl chloride.  With sterile technique and under real time ultrasound guidance:  Greater trochanteric area was visualized and patient's bursa was noted. A 22-gauge 3 inch needle was inserted and 4 cc of 0.5% Marcaine and 1 cc of Kenalog 40 mg/dL was injected. Pictures taken Completed without difficulty  Pain immediately resolved suggesting accurate placement of the medication.  Advised to call if fevers/chills,  erythema, induration, drainage, or persistent bleeding.  Images permanently stored  Impression: Technically successful ultrasound guided injection.    Impression and Recommendations:    The above documentation has been reviewed and is accurate and complete Jonnie Truxillo M Keiarra Charon, DO

## 2023-12-12 ENCOUNTER — Encounter: Payer: Self-pay | Admitting: Family Medicine

## 2023-12-12 ENCOUNTER — Ambulatory Visit (INDEPENDENT_AMBULATORY_CARE_PROVIDER_SITE_OTHER)

## 2023-12-12 ENCOUNTER — Ambulatory Visit: Admitting: Family Medicine

## 2023-12-12 VITALS — BP 110/78 | HR 90 | Ht 62.0 in | Wt 180.0 lb

## 2023-12-12 DIAGNOSIS — M7061 Trochanteric bursitis, right hip: Secondary | ICD-10-CM

## 2023-12-12 DIAGNOSIS — I259 Chronic ischemic heart disease, unspecified: Secondary | ICD-10-CM

## 2023-12-12 DIAGNOSIS — M25551 Pain in right hip: Secondary | ICD-10-CM

## 2023-12-12 DIAGNOSIS — R109 Unspecified abdominal pain: Secondary | ICD-10-CM

## 2023-12-12 NOTE — Patient Instructions (Addendum)
 Injection today GT bursitis Xray today Send message in 2 weeks if not beter will consider US  See me again in 2 months

## 2023-12-12 NOTE — Assessment & Plan Note (Signed)
 Patient given injection and tolerated the procedure well, discussed with patient that I do feel that some of the upper hip pain is more secondary to compensation.  Patient has had though surgery previously and is concerning this.  Patient will start to increase activity.  Worsening pain would get an abdominal ultrasound to further evaluate.  KUB, hip x-rays are pending as well.  No change in medications at the moment.  Do believe patient will do well with conservative therapy.

## 2023-12-14 ENCOUNTER — Encounter: Payer: Self-pay | Admitting: Family Medicine

## 2023-12-16 ENCOUNTER — Encounter: Payer: Self-pay | Admitting: Acute Care

## 2023-12-18 ENCOUNTER — Other Ambulatory Visit

## 2023-12-31 ENCOUNTER — Ambulatory Visit
Admission: RE | Admit: 2023-12-31 | Discharge: 2023-12-31 | Disposition: A | Discharge status: Home / Self Care | Source: Ambulatory Visit | Attending: Acute Care | Admitting: Acute Care

## 2023-12-31 DIAGNOSIS — Z87891 Personal history of nicotine dependence: Secondary | ICD-10-CM

## 2023-12-31 DIAGNOSIS — Z122 Encounter for screening for malignant neoplasm of respiratory organs: Secondary | ICD-10-CM

## 2024-01-26 ENCOUNTER — Other Ambulatory Visit: Payer: Self-pay

## 2024-01-26 DIAGNOSIS — Z87891 Personal history of nicotine dependence: Secondary | ICD-10-CM

## 2024-01-26 DIAGNOSIS — Z122 Encounter for screening for malignant neoplasm of respiratory organs: Secondary | ICD-10-CM

## 2024-02-04 NOTE — Progress Notes (Signed)
 [name] [address] [place] [address] Phone: [phone] Subjective:   Tracy Brown, am serving as a scribe for Dr. Arthea Claudene.  I'm seeing this patient by the request  of:  Johnny Garnette LABOR, MD  CC: Right sided pain again  YEP:Dlagzrupcz  12/12/2023 Patient given injection and tolerated the procedure well, discussed with patient that I do feel that some of the upper hip pain is more secondary to compensation.  Patient has had though surgery previously and is concerning this.  Patient will start to increase activity.  Worsening pain would get an abdominal ultrasound to further evaluate.  KUB, hip x-rays are pending as well.  No change in medications at the moment.  Do believe patient will do well with conservative therapy.    Update 02/06/2024 Tracy Brown is a 63 y.o. female coming in with complaint of R hip pain. Patient states that her pain is coming back with walking. Injection was somewhat helpful initially.   03/29/2022 last colonoscopy      Past Medical History:  Diagnosis Date   Allergy    Anemia    Chickenpox    Colon polyps    Diverticulosis    Heart murmur    History of recurrent UTIs    Hyperlipidemia    Hypertension    Hypothyroid    Nephrolithiasis    OA (osteoarthritis)    Serrated adenoma of colon 02/2017   Past Surgical History:  Procedure Laterality Date   APPENDECTOMY     BREAST CYST ASPIRATION Right    COLON SURGERY  03/2011   right colectomy, per Dr. Lynwood Pina, for tubular  adenoma    COLONOSCOPY  02/21/2022   per Dr. Kristie, polyps, repeat in 5 years   HEMORRHOID SURGERY     HERNIA REPAIR     RIGHT COLECTOMY  03/08/2011   per Dr. Lynwood Pina, for tubulovillous adenoma   TONSILLECTOMY     Social History   Socioeconomic History   Marital status: Divorced    Spouse name: Not on file   Number of children: 1   Years of education: Not on file   Highest education level: Not on file  Occupational  History   Not on file  Tobacco Use   Smoking status: Former    Current packs/day: 0.00    Average packs/day: 1 pack/day for 37.0 years (37.0 ttl pk-yrs)    Types: Cigarettes    Start date: 10/11/1979    Quit date: 10/10/2016    Years since quitting: 7.3   Smokeless tobacco: Never  Vaping Use   Vaping status: Never Used  Substance and Sexual Activity   Alcohol use: Yes    Alcohol/week: 0.0 standard drinks of alcohol    Comment: once a month   Drug use: No   Sexual activity: Yes  Other Topics Concern   Not on file  Social History Narrative   Not on file   Social Drivers of Health   Financial Resource Strain: Not on file  Food Insecurity: Not on file  Transportation Needs: Not on file  Physical Activity: Not on file  Stress: Not on file  Social Connections: Not on file   Allergies  Allergen Reactions   Hydrocodone    Phentermine  Itching   Zithromax [Azithromycin Dihydrate]    Family History  Problem Relation Age of Onset   Cancer Other        breast,colon,ovarian   Coronary artery disease Other  Hypertension Other    Stroke Other    Leukemia Father    Cancer Father    Breast cancer Cousin    Thyroid  disease Mother    Hypertension Mother    Stroke Mother    Breast cancer Maternal Grandmother     Current Outpatient Medications (Endocrine & Metabolic):    levothyroxine  (SYNTHROID ) 88 MCG tablet, Take 1 tablet (88 mcg total) by mouth daily.   metFORMIN  (GLUCOPHAGE ) 500 MG tablet, Take 1 tablet (500 mg total) by mouth daily with breakfast.  Current Outpatient Medications (Cardiovascular):    rosuvastatin  (CRESTOR ) 20 MG tablet, Take 1 tablet (20 mg total) by mouth daily.  Current Outpatient Medications (Respiratory):    cetirizine (ZYRTEC) 10 MG tablet, Take 10 mg by mouth daily.  Current Outpatient Medications (Analgesics):    aspirin 81 MG chewable tablet, Chew 81 mg by mouth daily.   celecoxib  (CELEBREX ) 200 MG capsule, Take 1 capsule (200 mg total) by  mouth 2 (two) times daily.   ibuprofen (ADVIL,MOTRIN) 200 MG tablet, Take 200 mg by mouth every 6 (six) hours as needed.   Current Outpatient Medications (Other):    ascorbic acid (VITAMIN C) 500 MG tablet, Take 500 mg by mouth daily.   azelastine  (OPTIVAR ) 0.05 % ophthalmic solution, Place 1 drop into both eyes 2 (two) times daily.   meclizine  (ANTIVERT ) 25 MG tablet, Take 1 tablet (25 mg total) by mouth every 4 (four) hours as needed for dizziness.   MULTIPLE VITAMINS PO, Take by mouth.   naloxone  (NARCAN ) nasal spray 4 mg/0.1 mL, As needed   Saccharomyces boulardii (PROBIOTIC) 250 MG CAPS, Take by mouth.   Vitamin D , Ergocalciferol , (DRISDOL ) 1.25 MG (50000 UNIT) CAPS capsule, TAKE 1 CAPSULE BY MOUTH EVERY 14 DAYS   zinc gluconate 50 MG tablet, Take 50 mg by mouth daily.   Reviewed prior external information including notes and imaging from  primary care provider As well as notes that were available from care everywhere and other healthcare systems.  Past medical history, social, surgical and family history all reviewed in electronic medical record.  No pertanent information unless stated regarding to the chief complaint.   Review of Systems:  No headache, visual changes, nausea, vomiting, diarrhea, constipation, dizziness, abdominal pain, skin rash, fevers, chills, night sweats, weight loss, swollen lymph nodes, body aches, joint swelling, chest pain, shortness of breath, mood changes. POSITIVE muscle aches  Objective  Blood pressure 120/82, pulse 74, height 5' 2 (1.575 m), SpO2 100%.   General: No apparent distress alert and oriented x3 mood and affect normal, dressed appropriately.  HEENT: Pupils equal, extraocular movements intact  Respiratory: Patient's speak in full sentences and does not appear short of breath  Cardiovascular: No lower extremity edema, non tender, no erythema  Pain seems to be more in the right lower quadrant of the abdomen and near the tensor fascia lata.   No pain with internal rotation of the hip.  Mild pain with resisted hip flexion.  No masses appreciated in the side.  Limited muscular skeletal ultrasound was performed and interpreted by CLAUDENE HUSSAR, M  Limited ultrasound does show some scar tissue and some calcific changes in the abdominal wall noted.  Seems to be more scar tissue.  No masses appreciated. Impression: Nonspecific calcific deposits noted.    Impression and Recommendations:

## 2024-02-06 ENCOUNTER — Other Ambulatory Visit: Payer: Self-pay

## 2024-02-06 ENCOUNTER — Encounter: Payer: Self-pay | Admitting: Family Medicine

## 2024-02-06 ENCOUNTER — Ambulatory Visit: Payer: Self-pay | Admitting: Family Medicine

## 2024-02-06 ENCOUNTER — Ambulatory Visit (INDEPENDENT_AMBULATORY_CARE_PROVIDER_SITE_OTHER): Admitting: Family Medicine

## 2024-02-06 VITALS — BP 120/82 | HR 74 | Ht 62.0 in

## 2024-02-06 DIAGNOSIS — M25551 Pain in right hip: Secondary | ICD-10-CM

## 2024-02-06 DIAGNOSIS — G8929 Other chronic pain: Secondary | ICD-10-CM | POA: Diagnosis not present

## 2024-02-06 DIAGNOSIS — R1031 Right lower quadrant pain: Secondary | ICD-10-CM | POA: Diagnosis not present

## 2024-02-06 DIAGNOSIS — M2550 Pain in unspecified joint: Secondary | ICD-10-CM

## 2024-02-06 LAB — COMPREHENSIVE METABOLIC PANEL WITH GFR
ALT: 22 U/L (ref 0–35)
AST: 19 U/L (ref 0–37)
Albumin: 4.7 g/dL (ref 3.5–5.2)
Alkaline Phosphatase: 61 U/L (ref 39–117)
BUN: 16 mg/dL (ref 6–23)
CO2: 30 meq/L (ref 19–32)
Calcium: 9.7 mg/dL (ref 8.4–10.5)
Chloride: 105 meq/L (ref 96–112)
Creatinine, Ser: 0.8 mg/dL (ref 0.40–1.20)
GFR: 78.91 mL/min (ref >60.00)
Glucose, Bld: 102 mg/dL — ABNORMAL HIGH (ref 70–99)
Potassium: 3.9 meq/L (ref 3.5–5.1)
Sodium: 140 meq/L (ref 135–145)
Total Bilirubin: 0.4 mg/dL (ref 0.2–1.2)
Total Protein: 7.4 g/dL (ref 6.0–8.3)

## 2024-02-06 LAB — CBC WITH DIFFERENTIAL/PLATELET
Basophils Absolute: 0 10*3/uL (ref 0.0–0.1)
Basophils Relative: 0.8 % (ref 0.0–3.0)
Eosinophils Absolute: 0.1 10*3/uL (ref 0.0–0.7)
Eosinophils Relative: 2.5 % (ref 0.0–5.0)
HCT: 40.8 % (ref 36.0–46.0)
Hemoglobin: 13.5 g/dL (ref 12.0–15.0)
Lymphocytes Relative: 34.7 % (ref 12.0–46.0)
Lymphs Abs: 2.1 10*3/uL (ref 0.7–4.0)
MCHC: 33 g/dL (ref 30.0–36.0)
MCV: 84.7 fl (ref 78.0–100.0)
Monocytes Absolute: 0.5 10*3/uL (ref 0.1–1.0)
Monocytes Relative: 8 % (ref 3.0–12.0)
Neutro Abs: 3.3 10*3/uL (ref 1.4–7.7)
Neutrophils Relative %: 54 % (ref 43.0–77.0)
Platelets: 224 10*3/uL (ref 150.0–400.0)
RBC: 4.82 Mil/uL (ref 3.87–5.11)
RDW: 14 % (ref 11.5–15.5)
WBC: 6.1 10*3/uL (ref 4.0–10.5)

## 2024-02-06 LAB — IRON: Iron: 102 ug/dL (ref 42–145)

## 2024-02-06 LAB — VITAMIN D 25 HYDROXY (VIT D DEFICIENCY, FRACTURES): VITD: 44.18 ng/mL (ref 30.00–100.00)

## 2024-02-06 LAB — FERRITIN: Ferritin: 84.9 ng/mL (ref 10.0–291.0)

## 2024-02-06 NOTE — Assessment & Plan Note (Signed)
 Questionable nonspecific calcific deposits noted.  Has had a history of low vitamin D  and low check.  Discussed potential other autoimmune patient having a history of sacroiliitis.  Discussed icing regimen and home exercises.  Increase activity slowly.  Follow-up again in 6 to 8 weeks patient has had a colonoscopy in August 2023.  Did have the colonic mass previously as well but did not have any abnormal weight loss, fevers or chills or any change in bowel habits at the moment.  Worsening pain we discussed the possibility of shockwave therapy versus the possibility of injections in the cutaneous nerve for diagnostic and potentially therapeutic purposes.

## 2024-02-06 NOTE — Patient Instructions (Addendum)
 Massage gun to the area Labs on the way out  2 month follow up

## 2024-02-07 LAB — LACTATE DEHYDROGENASE: LDH: 199 U/L (ref 120–250)

## 2024-02-10 LAB — PTH, INTACT AND CALCIUM
Calcium: 10.2 mg/dL (ref 8.6–10.4)
PTH: 27 pg/mL (ref 16–77)

## 2024-02-27 ENCOUNTER — Other Ambulatory Visit: Payer: Self-pay | Admitting: Family Medicine

## 2024-03-01 NOTE — Telephone Encounter (Signed)
 Done

## 2024-04-02 NOTE — Progress Notes (Deleted)
 [name] [address] [place] [address] Phone: [phone] Subjective:    I'm seeing this patient by the request  of:  Johnny Garnette LABOR, MD  CC:   YEP:Dlagzrupcz  02/06/2024 Questionable nonspecific calcific deposits noted. Has had a history of low vitamin D  and low check. Discussed potential other autoimmune patient having a history of sacroiliitis. Discussed icing regimen and home exercises. Increase activity slowly. Follow-up again in 6 to 8 weeks patient has had a colonoscopy in August 2023. Did have the colonic mass previously as well but did not have any abnormal weight loss, fevers or chills or any change in bowel habits at the moment. Worsening pain we discussed the possibility of shockwave therapy versus the possibility of injections in the cutaneous nerve for diagnostic and potentially therapeutic purposes.   Updated 04/07/2024 Tracy Brown is a 63 y.o. female coming in with complaint of hip and abdominal pain.       Past Medical History:  Diagnosis Date   Allergy    Anemia    Chickenpox    Colon polyps    Diverticulosis    Heart murmur    History of recurrent UTIs    Hyperlipidemia    Hypertension    Hypothyroid    Nephrolithiasis    OA (osteoarthritis)    Serrated adenoma of colon 02/2017   Past Surgical History:  Procedure Laterality Date   APPENDECTOMY     BREAST CYST ASPIRATION Right    COLON SURGERY  03/2011   right colectomy, per Dr. Lynwood Pina, for tubular  adenoma    COLONOSCOPY  02/21/2022   per Dr. Kristie, polyps, repeat in 5 years   HEMORRHOID SURGERY     HERNIA REPAIR     RIGHT COLECTOMY  03/08/2011   per Dr. Lynwood Pina, for tubulovillous adenoma   TONSILLECTOMY     Social History   Socioeconomic History   Marital status: Divorced    Spouse name: Not on file   Number of children: 1   Years of education: Not on file   Highest education level: Not on file  Occupational History   Not on file   Tobacco Use   Smoking status: Former    Current packs/day: 0.00    Average packs/day: 1 pack/day for 37.0 years (37.0 ttl pk-yrs)    Types: Cigarettes    Start date: 10/11/1979    Quit date: 10/10/2016    Years since quitting: 7.4   Smokeless tobacco: Never  Vaping Use   Vaping status: Never Used  Substance and Sexual Activity   Alcohol use: Yes    Alcohol/week: 0.0 standard drinks of alcohol    Comment: once a month   Drug use: No   Sexual activity: Yes  Other Topics Concern   Not on file  Social History Narrative   Not on file   Social Drivers of Health   Financial Resource Strain: Not on file  Food Insecurity: Not on file  Transportation Needs: Not on file  Physical Activity: Not on file  Stress: Not on file  Social Connections: Not on file   Allergies  Allergen Reactions   Hydrocodone    Phentermine  Itching   Zithromax [Azithromycin Dihydrate]    Family History  Problem Relation Age of Onset   Cancer Other        breast,colon,ovarian   Coronary artery disease Other    Hypertension Other    Stroke Other    Leukemia Father  Cancer Father    Breast cancer Cousin    Thyroid  disease Mother    Hypertension Mother    Stroke Mother    Breast cancer Maternal Grandmother     Current Outpatient Medications (Endocrine & Metabolic):    levothyroxine  (SYNTHROID ) 88 MCG tablet, Take 1 tablet (88 mcg total) by mouth daily.   metFORMIN  (GLUCOPHAGE ) 500 MG tablet, Take 1 tablet (500 mg total) by mouth daily with breakfast.  Current Outpatient Medications (Cardiovascular):    rosuvastatin  (CRESTOR ) 20 MG tablet, Take 1 tablet (20 mg total) by mouth daily.  Current Outpatient Medications (Respiratory):    cetirizine (ZYRTEC) 10 MG tablet, Take 10 mg by mouth daily.  Current Outpatient Medications (Analgesics):    aspirin 81 MG chewable tablet, Chew 81 mg by mouth daily.   celecoxib  (CELEBREX ) 200 MG capsule, Take 1 capsule (200 mg total) by mouth 2 (two) times  daily.   ibuprofen (ADVIL,MOTRIN) 200 MG tablet, Take 200 mg by mouth every 6 (six) hours as needed.   Current Outpatient Medications (Other):    ascorbic acid (VITAMIN C) 500 MG tablet, Take 500 mg by mouth daily.   azelastine  (OPTIVAR ) 0.05 % ophthalmic solution, Place 1 drop into both eyes 2 (two) times daily.   meclizine  (ANTIVERT ) 25 MG tablet, Take 1 tablet (25 mg total) by mouth every 4 (four) hours as needed for dizziness.   MULTIPLE VITAMINS PO, Take by mouth.   naloxone  (NARCAN ) nasal spray 4 mg/0.1 mL, As needed   Saccharomyces boulardii (PROBIOTIC) 250 MG CAPS, Take by mouth.   Vitamin D , Ergocalciferol , (DRISDOL ) 1.25 MG (50000 UNIT) CAPS capsule, TAKE 1 CAPSULE BY MOUTH EVERY 14 DAYS   zinc gluconate 50 MG tablet, Take 50 mg by mouth daily.   Reviewed prior external information including notes and imaging from  primary care provider As well as notes that were available from care everywhere and other healthcare systems.  Past medical history, social, surgical and family history all reviewed in electronic medical record.  No pertanent information unless stated regarding to the chief complaint.   Review of Systems:  No headache, visual changes, nausea, vomiting, diarrhea, constipation, dizziness, abdominal pain, skin rash, fevers, chills, night sweats, weight loss, swollen lymph nodes, body aches, joint swelling, chest pain, shortness of breath, mood changes. POSITIVE muscle aches  Objective  There were no vitals taken for this visit.   General: No apparent distress alert and oriented x3 mood and affect normal, dressed appropriately.  HEENT: Pupils equal, extraocular movements intact  Respiratory: Patient's speak in full sentences and does not appear short of breath  Cardiovascular: No lower extremity edema, non tender, no erythema      Impression and Recommendations:

## 2024-04-07 ENCOUNTER — Ambulatory Visit: Admitting: Family Medicine

## 2024-04-20 ENCOUNTER — Ambulatory Visit: Payer: Self-pay

## 2024-04-20 ENCOUNTER — Encounter: Payer: Self-pay | Admitting: Family Medicine

## 2024-04-20 ENCOUNTER — Ambulatory Visit (INDEPENDENT_AMBULATORY_CARE_PROVIDER_SITE_OTHER): Admitting: Family Medicine

## 2024-04-20 VITALS — BP 124/80 | HR 67 | Temp 98.7°F | Wt 183.0 lb

## 2024-04-20 DIAGNOSIS — R1031 Right lower quadrant pain: Secondary | ICD-10-CM

## 2024-04-20 DIAGNOSIS — J3089 Other allergic rhinitis: Secondary | ICD-10-CM | POA: Diagnosis not present

## 2024-04-20 LAB — POC URINALSYSI DIPSTICK (AUTOMATED)
Bilirubin, UA: NEGATIVE
Blood, UA: NEGATIVE
Glucose, UA: NEGATIVE
Ketones, UA: NEGATIVE
Leukocytes, UA: NEGATIVE — AB
Nitrite, UA: NEGATIVE
Protein, UA: NEGATIVE
Spec Grav, UA: 1.01 — AB (ref 1.010–1.025)
Urobilinogen, UA: 0.2 U/dL — AB
pH, UA: 6.5 (ref 5.0–8.0)

## 2024-04-20 NOTE — Progress Notes (Signed)
   Subjective:    Patient ID: Tracy Brown, female    DOB: 1960/11/25, 63 y.o.   MRN: [id number]  HPI Here for 2 issues. First she began to have an intermittent pain in the right lower quadrant about 4 weeks ago. This is sharp in nature, and it has not moved. She rates the pain as a 7 out of 10. No fever. No urinary symptoms. BM's are regular. The other issue is recurrent spells of stuffy head, ear pressure, and mild ST. No fever or cough.    Review of Systems  Constitutional: Negative.   HENT:  Positive for congestion, ear pain and sore throat. Negative for hearing loss, postnasal drip and sinus pain.   Eyes: Negative.   Respiratory: Negative.    Cardiovascular: Negative.   Gastrointestinal:  Positive for abdominal pain. Negative for abdominal distention, blood in stool, constipation, diarrhea, nausea and vomiting.  Genitourinary: Negative.        Objective:   Physical Exam Constitutional:      Appearance: She is not ill-appearing.  HENT:     Right Ear: Tympanic membrane, ear canal and external ear normal.     Left Ear: Tympanic membrane, ear canal and external ear normal.     Nose: Nose normal.     Mouth/Throat:     Pharynx: Oropharynx is clear.  Eyes:     Conjunctiva/sclera: Conjunctivae normal.  Cardiovascular:     Rate and Rhythm: Normal rate and regular rhythm.     Pulses: Normal pulses.     Heart sounds: Normal heart sounds.  Pulmonary:     Effort: Pulmonary effort is normal.     Breath sounds: Normal breath sounds.  Abdominal:     General: Abdomen is flat. Bowel sounds are normal. There is no distension.     Palpations: Abdomen is soft. There is no mass.     Tenderness: There is no right CVA tenderness, left CVA tenderness, guarding or rebound.     Hernia: No hernia is present.     Comments: She is mildly tender in the right lower quadrant   Lymphadenopathy:     Cervical: No cervical adenopathy.  Neurological:     Mental Status: She is alert.            Assessment & Plan:  She is having RLQ pains, and the most likely etiologies would be abdominal adhesions from prior surgeries or a kidney stone. She will drink plenty of water. We will set up an abdominal and pelvic CT scan to evaluate this further. She also has seasonal allergies, and she can treat these with Claritin, Mucinex, and Flonase  as needed.  Garnette Olmsted, MD

## 2024-04-20 NOTE — Telephone Encounter (Signed)
 Spoke with pt scheduled OV with PCP this afternoon

## 2024-04-20 NOTE — Telephone Encounter (Signed)
 Tracy Brown advised that the recommendation is to be seen within 4 hours No availability in PCP office & Tracy Brown wanted to see her PCP Dr Brown Tracy instead of going to another location Tracy Brown would like a call back to be scheduled if possible   FYI Only or Action Required?: Action required by provider: would like an appointment at her PCP office--she would like to be called if one becomes available.  Tracy Brown was last seen in primary care on 10/22/2023 by Tracy Brown LABOR, MD.  Called Nurse Triage reporting Abdominal Pain.  Symptoms began several weeks ago.  Interventions attempted: OTC medications: Advil.  Symptoms are: gradually worsening.  Triage Disposition: See HCP Within 4 Hours (Or PCP Triage)  Tracy Brown/caregiver understands and will follow disposition?: No, wishes to speak with PCP                 Copied from CRM #[id number]. Topic: Clinical - Red Word Triage >> Apr 20, 2024 10:39 AM Carlyon D wrote: Red Word that prompted transfer to Nurse Triage: Allergies, Front Right side abdomen pain Reason for Disposition  [1] MILD-MODERATE pain AND [2] constant AND [3] present > 2 hours  Answer Assessment - Initial Assessment Questions Tracy Brown is advised that the recommendation is to be seen within 4 hours The only available appointment in the area is at the [facility] at [facility] today with Dr Garald. Tracy Brown wasn't sure she wanted to go to another office with a provider she isnt familiar with. She prefers to stay at her PCP office with her PCP Dr Brown Tracy Tracy Brown is advised that Urgent Care is also an option & she can call us  back at anytime with any further questions She is also advised that if anything gets worse to go to the Emergency Room Tracy Brown verbalized understanding and states she may call back to make an appointment--She states she is going to think about it because she would prefer her PCP   1. LOCATION: Where does it hurt?      Right side of  abdomen 2. RADIATION: Does the pain shoot anywhere else? (e.g., chest, back)     Some back pain but not constant 3. ONSET: When did the pain begin? (e.g., minutes, hours or days ago)      A few weeks ago 4. SUDDEN: Gradual or sudden onset?     ----- 5. PATTERN Does the pain come and go, or is it constant?     Come and go   more constant now 6. SEVERITY: How bad is the pain?  (e.g., Scale 1-10; mild, moderate, or severe)     7 7. RECURRENT SYMPTOM: Have you ever had this type of stomach pain before? If Yes, ask: When was the last time? and What happened that time?      Yes---colon surgery on the right side before and history of stone 8. CAUSE: What do you think is causing the stomach pain? (e.g., gallstones, recent abdominal surgery)     unsure 9. RELIEVING/AGGRAVATING FACTORS: What makes it better or worse? (e.g., antacids, bending or twisting motion, bowel movement)     ----- 10. OTHER SYMPTOMS: Do you have any other symptoms? (e.g., back pain, diarrhea, fever, urination pain, vomiting)       Some allergy/congestion/sinus problems  Protocols used: Abdominal Pain - Female-A-AH

## 2024-04-21 ENCOUNTER — Encounter: Payer: Self-pay | Admitting: Family Medicine

## 2024-04-21 ENCOUNTER — Telehealth: Payer: Self-pay | Admitting: Family Medicine

## 2024-04-21 NOTE — Telephone Encounter (Signed)
 Copied from CRM [id number]. Topic: General - Other >> Apr 21, 2024  9:09 AM Burnard DEL wrote: Reason for CRM: [facility] called stating that patient needs a PA for CT abdomen pelvis with contrast that was ordered for patient

## 2024-04-22 ENCOUNTER — Encounter: Payer: Self-pay | Admitting: Radiology

## 2024-04-22 ENCOUNTER — Ambulatory Visit
Admission: RE | Admit: 2024-04-22 | Discharge: 2024-04-22 | Disposition: A | Discharge status: Home / Self Care | Source: Ambulatory Visit | Attending: Family Medicine | Admitting: Family Medicine

## 2024-04-22 DIAGNOSIS — R1031 Right lower quadrant pain: Secondary | ICD-10-CM

## 2024-04-22 MED ORDER — IOPAMIDOL (ISOVUE-300) INJECTION 61%
100.0000 mL | Freq: Once | INTRAVENOUS | Status: AC | PRN
  Administered 2024-04-22: 100 mL via INTRAVENOUS

## 2024-04-23 ENCOUNTER — Other Ambulatory Visit

## 2024-04-26 ENCOUNTER — Ambulatory Visit: Payer: Self-pay | Admitting: Family Medicine

## 2024-04-27 NOTE — Telephone Encounter (Signed)
 I understand. No referral to Surgery at this time. As for the mammogram, different radiologists may interpret these somewhat differently. She should not worry about whether they called it dense or not, the important thing is they saw nothing worrisome

## 2024-05-20 ENCOUNTER — Other Ambulatory Visit: Payer: Self-pay | Admitting: Family Medicine

## 2024-05-20 DIAGNOSIS — E559 Vitamin D deficiency, unspecified: Secondary | ICD-10-CM

## 2024-08-20 ENCOUNTER — Ambulatory Visit: Payer: Self-pay | Admitting: Family Medicine

## 2024-08-20 ENCOUNTER — Ambulatory Visit: Admitting: Family Medicine

## 2024-08-20 ENCOUNTER — Encounter: Payer: Self-pay | Admitting: Family Medicine

## 2024-08-20 VITALS — BP 120/78 | HR 72 | Temp 97.9°F | Ht 60.75 in | Wt 163.0 lb

## 2024-08-20 DIAGNOSIS — Z0000 Encounter for general adult medical examination without abnormal findings: Secondary | ICD-10-CM | POA: Diagnosis not present

## 2024-08-20 DIAGNOSIS — Z23 Encounter for immunization: Secondary | ICD-10-CM | POA: Diagnosis not present

## 2024-08-20 DIAGNOSIS — E038 Other specified hypothyroidism: Secondary | ICD-10-CM

## 2024-08-20 DIAGNOSIS — E559 Vitamin D deficiency, unspecified: Secondary | ICD-10-CM | POA: Insufficient documentation

## 2024-08-20 LAB — BASIC METABOLIC PANEL WITH GFR
BUN: 16 mg/dL (ref 6–23)
CO2: 28 meq/L (ref 19–32)
Calcium: 9.8 mg/dL (ref 8.4–10.5)
Chloride: 105 meq/L (ref 96–112)
Creatinine, Ser: 0.71 mg/dL (ref 0.40–1.20)
GFR: 90.71 mL/min
Glucose, Bld: 84 mg/dL (ref 70–99)
Potassium: 4.2 meq/L (ref 3.5–5.1)
Sodium: 139 meq/L (ref 135–145)

## 2024-08-20 LAB — T3, FREE: T3, Free: 2.7 pg/mL (ref 2.3–4.2)

## 2024-08-20 LAB — CBC WITH DIFFERENTIAL/PLATELET
Basophils Absolute: 0 K/uL (ref 0.0–0.1)
Basophils Relative: 0.7 % (ref 0.0–3.0)
Eosinophils Absolute: 0.1 K/uL (ref 0.0–0.7)
Eosinophils Relative: 1.9 % (ref 0.0–5.0)
HCT: 40.6 % (ref 36.0–46.0)
Hemoglobin: 13.5 g/dL (ref 12.0–15.0)
Lymphocytes Relative: 35.2 % (ref 12.0–46.0)
Lymphs Abs: 1.8 K/uL (ref 0.7–4.0)
MCHC: 33.1 g/dL (ref 30.0–36.0)
MCV: 85.1 fl (ref 78.0–100.0)
Monocytes Absolute: 0.5 K/uL (ref 0.1–1.0)
Monocytes Relative: 8.6 % (ref 3.0–12.0)
Neutro Abs: 2.8 K/uL (ref 1.4–7.7)
Neutrophils Relative %: 53.6 % (ref 43.0–77.0)
Platelets: 172 K/uL (ref 150.0–400.0)
RBC: 4.77 Mil/uL (ref 3.87–5.11)
RDW: 13.6 % (ref 11.5–15.5)
WBC: 5.2 K/uL (ref 4.0–10.5)

## 2024-08-20 LAB — HEPATIC FUNCTION PANEL
ALT: 29 U/L (ref 3–35)
AST: 20 U/L (ref 5–37)
Albumin: 4.6 g/dL (ref 3.5–5.2)
Alkaline Phosphatase: 60 U/L (ref 39–117)
Bilirubin, Direct: 0.1 mg/dL (ref 0.1–0.3)
Total Bilirubin: 0.4 mg/dL (ref 0.2–1.2)
Total Protein: 7.3 g/dL (ref 6.0–8.3)

## 2024-08-20 LAB — VITAMIN D 25 HYDROXY (VIT D DEFICIENCY, FRACTURES): VITD: 45.42 ng/mL (ref 30.00–100.00)

## 2024-08-20 LAB — LIPID PANEL
Cholesterol: 102 mg/dL (ref 28–200)
HDL: 46 mg/dL
LDL Cholesterol: 42 mg/dL (ref 10–99)
NonHDL: 56.1
Total CHOL/HDL Ratio: 2
Triglycerides: 73 mg/dL (ref 10.0–149.0)
VLDL: 14.6 mg/dL (ref 0.0–40.0)

## 2024-08-20 LAB — HEMOGLOBIN A1C: Hgb A1c MFr Bld: 5.6 % (ref 4.6–6.5)

## 2024-08-20 LAB — T4, FREE: Free T4: 1.11 ng/dL (ref 0.60–1.60)

## 2024-08-20 LAB — TSH: TSH: 0.56 u[IU]/mL (ref 0.35–5.50)

## 2024-08-20 NOTE — Progress Notes (Signed)
" ° °  Subjective:    Patient ID: Tracy Brown, female    DOB: March 29, 1961, 64 y.o.   MRN: [id number]  HPI Here for a well exam. She feels well. She has been watching her diet and walking, and she has lost 20 lbs since her last visit.    Review of Systems  Constitutional: Negative.   HENT: Negative.    Eyes: Negative.   Respiratory: Negative.    Cardiovascular: Negative.   Gastrointestinal: Negative.   Genitourinary:  Negative for decreased urine volume, difficulty urinating, dyspareunia, dysuria, enuresis, flank pain, frequency, hematuria, pelvic pain and urgency.  Musculoskeletal: Negative.   Skin: Negative.   Neurological: Negative.  Negative for headaches.  Psychiatric/Behavioral: Negative.         Objective:   Physical Exam Constitutional:      General: She is not in acute distress.    Appearance: She is well-developed.  HENT:     Head: Normocephalic and atraumatic.     Right Ear: External ear normal.     Left Ear: External ear normal.     Nose: Nose normal.     Mouth/Throat:     Pharynx: No oropharyngeal exudate.  Eyes:     General: No scleral icterus.    Conjunctiva/sclera: Conjunctivae normal.     Pupils: Pupils are equal, round, and reactive to light.  Neck:     Thyroid : No thyromegaly.     Vascular: No JVD.  Cardiovascular:     Rate and Rhythm: Normal rate and regular rhythm.     Pulses: Normal pulses.     Heart sounds: Normal heart sounds. No murmur heard.    No friction rub. No gallop.  Pulmonary:     Effort: Pulmonary effort is normal. No respiratory distress.     Breath sounds: Normal breath sounds. No wheezing or rales.  Chest:     Chest wall: No tenderness.  Abdominal:     General: Bowel sounds are normal. There is no distension.     Palpations: Abdomen is soft. There is no mass.     Tenderness: There is no abdominal tenderness. There is no guarding or rebound.  Musculoskeletal:        General: No tenderness. Normal range of motion.     Cervical  back: Normal range of motion and neck supple.  Lymphadenopathy:     Cervical: No cervical adenopathy.  Skin:    General: Skin is warm and dry.     Findings: No erythema or rash.  Neurological:     General: No focal deficit present.     Mental Status: She is alert and oriented to person, place, and time.     Cranial Nerves: No cranial nerve deficit.     Motor: No abnormal muscle tone.     Coordination: Coordination normal.     Deep Tendon Reflexes: Reflexes are normal and symmetric. Reflexes normal.  Psychiatric:        Mood and Affect: Mood normal.        Behavior: Behavior normal.        Thought Content: Thought content normal.        Judgment: Judgment normal.           Assessment & Plan:  Well exam. We discussed diet and exercise. Get fasting labs. Garnette Olmsted, MD   "

## 2024-08-20 NOTE — Addendum Note (Signed)
 Addended by: LADONNA INOCENTE SAILOR on: 08/20/2024 11:15 AM   Modules accepted: Orders

## 2024-08-26 ENCOUNTER — Encounter: Payer: Self-pay | Admitting: Family Medicine

## 2024-08-26 NOTE — Telephone Encounter (Signed)
 Awaiting for pt to send her waist circumference measurements

## 2024-08-26 NOTE — Telephone Encounter (Signed)
 FYI Pt form printed and placed in Dr Johnny red folder

## 2024-08-26 NOTE — Telephone Encounter (Signed)
 The form is ready
